# Patient Record
Sex: Female | Born: 1980 | Race: White | Hispanic: No | Marital: Married | Smoking: Former smoker
Health system: Southern US, Community
[De-identification: ages and names within clinical notes are randomized; demographics above are authoritative.]

## PROBLEM LIST (undated history)

## (undated) DIAGNOSIS — T8859XA Other complications of anesthesia, initial encounter: Secondary | ICD-10-CM

## (undated) DIAGNOSIS — Z22322 Carrier or suspected carrier of Methicillin resistant Staphylococcus aureus: Secondary | ICD-10-CM

## (undated) DIAGNOSIS — R011 Cardiac murmur, unspecified: Secondary | ICD-10-CM

## (undated) DIAGNOSIS — B019 Varicella without complication: Secondary | ICD-10-CM

## (undated) DIAGNOSIS — R87619 Unspecified abnormal cytological findings in specimens from cervix uteri: Secondary | ICD-10-CM

## (undated) DIAGNOSIS — F329 Major depressive disorder, single episode, unspecified: Secondary | ICD-10-CM

## (undated) DIAGNOSIS — N390 Urinary tract infection, site not specified: Secondary | ICD-10-CM

## (undated) DIAGNOSIS — A389 Scarlet fever, uncomplicated: Secondary | ICD-10-CM

## (undated) DIAGNOSIS — Z8679 Personal history of other diseases of the circulatory system: Secondary | ICD-10-CM

## (undated) DIAGNOSIS — K219 Gastro-esophageal reflux disease without esophagitis: Secondary | ICD-10-CM

## (undated) DIAGNOSIS — IMO0002 Reserved for concepts with insufficient information to code with codable children: Secondary | ICD-10-CM

## (undated) DIAGNOSIS — I340 Nonrheumatic mitral (valve) insufficiency: Secondary | ICD-10-CM

## (undated) DIAGNOSIS — I10 Essential (primary) hypertension: Secondary | ICD-10-CM

## (undated) DIAGNOSIS — T4145XA Adverse effect of unspecified anesthetic, initial encounter: Secondary | ICD-10-CM

## (undated) DIAGNOSIS — F32A Depression, unspecified: Secondary | ICD-10-CM

## (undated) DIAGNOSIS — E039 Hypothyroidism, unspecified: Secondary | ICD-10-CM

## (undated) DIAGNOSIS — J302 Other seasonal allergic rhinitis: Secondary | ICD-10-CM

## (undated) DIAGNOSIS — L0291 Cutaneous abscess, unspecified: Secondary | ICD-10-CM

## (undated) DIAGNOSIS — F4310 Post-traumatic stress disorder, unspecified: Secondary | ICD-10-CM

## (undated) DIAGNOSIS — G43909 Migraine, unspecified, not intractable, without status migrainosus: Secondary | ICD-10-CM

## (undated) HISTORY — DX: Cutaneous abscess, unspecified: L02.91

## (undated) HISTORY — DX: Scarlet fever, uncomplicated: A38.9

## (undated) HISTORY — PX: MYRINGOTOMY: SUR874

## (undated) HISTORY — DX: Major depressive disorder, single episode, unspecified: F32.9

## (undated) HISTORY — DX: Migraine, unspecified, not intractable, without status migrainosus: G43.909

## (undated) HISTORY — DX: Depression, unspecified: F32.A

## (undated) HISTORY — DX: Urinary tract infection, site not specified: N39.0

## (undated) HISTORY — DX: Nonrheumatic mitral (valve) insufficiency: I34.0

## (undated) HISTORY — PX: TYMPANOSTOMY TUBE PLACEMENT: SHX32

## (undated) HISTORY — DX: Post-traumatic stress disorder, unspecified: F43.10

## (undated) HISTORY — DX: Other seasonal allergic rhinitis: J30.2

## (undated) HISTORY — DX: Varicella without complication: B01.9

## (undated) HISTORY — DX: Cardiac murmur, unspecified: R01.1

## (undated) HISTORY — DX: Carrier or suspected carrier of methicillin resistant Staphylococcus aureus: Z22.322

## (undated) HISTORY — DX: Personal history of other diseases of the circulatory system: Z86.79

## (undated) HISTORY — PX: WISDOM TOOTH EXTRACTION: SHX21

## (undated) HISTORY — DX: Essential (primary) hypertension: I10

---
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
## 2002-09-04 ENCOUNTER — Other Ambulatory Visit: Admission: RE | Admit: 2002-09-04 | Discharge: 2002-09-04 | Payer: Self-pay | Admitting: Obstetrics & Gynecology

## 2004-02-04 ENCOUNTER — Other Ambulatory Visit: Admission: RE | Admit: 2004-02-04 | Discharge: 2004-02-04 | Payer: Self-pay | Admitting: Obstetrics & Gynecology

## 2004-04-26 ENCOUNTER — Emergency Department (HOSPITAL_COMMUNITY): Admission: EM | Admit: 2004-04-26 | Discharge: 2004-04-26 | Payer: Self-pay | Admitting: Emergency Medicine

## 2004-05-14 ENCOUNTER — Emergency Department (HOSPITAL_COMMUNITY): Admission: EM | Admit: 2004-05-14 | Discharge: 2004-05-14 | Payer: Self-pay | Admitting: Emergency Medicine

## 2004-06-06 ENCOUNTER — Ambulatory Visit (HOSPITAL_COMMUNITY): Admission: RE | Admit: 2004-06-06 | Discharge: 2004-06-06 | Payer: Self-pay | Admitting: Family Medicine

## 2004-11-21 ENCOUNTER — Emergency Department (HOSPITAL_COMMUNITY): Admission: EM | Admit: 2004-11-21 | Discharge: 2004-11-21 | Payer: Self-pay | Admitting: Emergency Medicine

## 2005-05-16 ENCOUNTER — Other Ambulatory Visit: Admission: RE | Admit: 2005-05-16 | Discharge: 2005-05-16 | Payer: Self-pay | Admitting: Obstetrics & Gynecology

## 2005-06-27 ENCOUNTER — Emergency Department (HOSPITAL_COMMUNITY): Admission: EM | Admit: 2005-06-27 | Discharge: 2005-06-27 | Payer: Self-pay | Admitting: Emergency Medicine

## 2005-06-30 ENCOUNTER — Emergency Department (HOSPITAL_COMMUNITY): Admission: EM | Admit: 2005-06-30 | Discharge: 2005-06-30 | Payer: Self-pay | Admitting: Emergency Medicine

## 2005-08-13 ENCOUNTER — Emergency Department (HOSPITAL_COMMUNITY): Admission: EM | Admit: 2005-08-13 | Discharge: 2005-08-13 | Payer: Self-pay | Admitting: Emergency Medicine

## 2005-11-18 ENCOUNTER — Ambulatory Visit (HOSPITAL_COMMUNITY): Admission: RE | Admit: 2005-11-18 | Discharge: 2005-11-18 | Payer: Self-pay | Admitting: Family Medicine

## 2005-11-30 ENCOUNTER — Encounter: Admission: RE | Admit: 2005-11-30 | Discharge: 2006-02-28 | Payer: Self-pay | Admitting: Family Medicine

## 2006-05-22 ENCOUNTER — Emergency Department (HOSPITAL_COMMUNITY): Admission: EM | Admit: 2006-05-22 | Discharge: 2006-05-22 | Payer: Self-pay | Admitting: Emergency Medicine

## 2006-06-15 ENCOUNTER — Ambulatory Visit (HOSPITAL_COMMUNITY): Payer: Self-pay | Admitting: Physician Assistant

## 2006-06-24 ENCOUNTER — Emergency Department (HOSPITAL_COMMUNITY): Admission: EM | Admit: 2006-06-24 | Discharge: 2006-06-24 | Payer: Self-pay | Admitting: Emergency Medicine

## 2006-07-11 ENCOUNTER — Ambulatory Visit (HOSPITAL_COMMUNITY): Payer: Self-pay | Admitting: Psychiatry

## 2006-08-06 ENCOUNTER — Emergency Department (HOSPITAL_COMMUNITY): Admission: EM | Admit: 2006-08-06 | Discharge: 2006-08-06 | Payer: Self-pay | Admitting: Emergency Medicine

## 2007-04-24 ENCOUNTER — Emergency Department (HOSPITAL_COMMUNITY): Admission: EM | Admit: 2007-04-24 | Discharge: 2007-04-25 | Payer: Self-pay | Admitting: Emergency Medicine

## 2007-06-23 ENCOUNTER — Emergency Department (HOSPITAL_COMMUNITY): Admission: EM | Admit: 2007-06-23 | Discharge: 2007-06-23 | Payer: Self-pay | Admitting: Emergency Medicine

## 2007-10-16 ENCOUNTER — Emergency Department (HOSPITAL_COMMUNITY): Admission: EM | Admit: 2007-10-16 | Discharge: 2007-10-16 | Payer: Self-pay | Admitting: Emergency Medicine

## 2008-01-24 ENCOUNTER — Emergency Department (HOSPITAL_COMMUNITY): Admission: EM | Admit: 2008-01-24 | Discharge: 2008-01-24 | Payer: Self-pay | Admitting: Family Medicine

## 2008-03-15 ENCOUNTER — Emergency Department (HOSPITAL_COMMUNITY): Admission: EM | Admit: 2008-03-15 | Discharge: 2008-03-16 | Payer: Self-pay | Admitting: Emergency Medicine

## 2008-04-19 ENCOUNTER — Emergency Department (HOSPITAL_COMMUNITY): Admission: EM | Admit: 2008-04-19 | Discharge: 2008-04-19 | Payer: Self-pay | Admitting: Emergency Medicine

## 2008-05-10 ENCOUNTER — Emergency Department (HOSPITAL_COMMUNITY): Admission: EM | Admit: 2008-05-10 | Discharge: 2008-05-11 | Payer: Self-pay | Admitting: Emergency Medicine

## 2008-10-04 ENCOUNTER — Emergency Department (HOSPITAL_BASED_OUTPATIENT_CLINIC_OR_DEPARTMENT_OTHER): Admission: EM | Admit: 2008-10-04 | Discharge: 2008-10-04 | Payer: Self-pay | Admitting: Emergency Medicine

## 2008-10-04 ENCOUNTER — Ambulatory Visit: Payer: Self-pay | Admitting: Diagnostic Radiology

## 2009-07-19 ENCOUNTER — Emergency Department (HOSPITAL_COMMUNITY): Admission: EM | Admit: 2009-07-19 | Discharge: 2009-07-19 | Payer: Self-pay | Admitting: Emergency Medicine

## 2010-07-09 ENCOUNTER — Encounter: Payer: Self-pay | Admitting: Family Medicine

## 2010-09-04 ENCOUNTER — Emergency Department (HOSPITAL_COMMUNITY)
Admission: EM | Admit: 2010-09-04 | Discharge: 2010-09-05 | Disposition: A | Discharge status: Home / Self Care | Payer: 59 | Attending: Emergency Medicine | Admitting: Emergency Medicine

## 2010-09-04 ENCOUNTER — Encounter (HOSPITAL_COMMUNITY): Payer: Self-pay

## 2010-09-04 ENCOUNTER — Emergency Department (HOSPITAL_COMMUNITY): Payer: 59

## 2010-09-04 DIAGNOSIS — R110 Nausea: Secondary | ICD-10-CM | POA: Insufficient documentation

## 2010-09-04 DIAGNOSIS — R1013 Epigastric pain: Secondary | ICD-10-CM | POA: Insufficient documentation

## 2010-09-04 DIAGNOSIS — R197 Diarrhea, unspecified: Secondary | ICD-10-CM | POA: Insufficient documentation

## 2010-09-04 DIAGNOSIS — K219 Gastro-esophageal reflux disease without esophagitis: Secondary | ICD-10-CM | POA: Insufficient documentation

## 2010-09-04 DIAGNOSIS — E039 Hypothyroidism, unspecified: Secondary | ICD-10-CM | POA: Insufficient documentation

## 2010-09-04 DIAGNOSIS — R109 Unspecified abdominal pain: Secondary | ICD-10-CM | POA: Insufficient documentation

## 2010-09-04 LAB — COMPREHENSIVE METABOLIC PANEL
ALT: 16 U/L (ref 0–35)
AST: 16 U/L (ref 0–37)
Albumin: 3.4 g/dL — ABNORMAL LOW (ref 3.5–5.2)
Alkaline Phosphatase: 49 U/L (ref 39–117)
BUN: 9 mg/dL (ref 6–23)
CO2: 23 mEq/L (ref 19–32)
Calcium: 8.8 mg/dL (ref 8.4–10.5)
Chloride: 104 mEq/L (ref 96–112)
Creatinine, Ser: 0.85 mg/dL (ref 0.4–1.2)
GFR calc Af Amer: >60 mL/min (ref >60)
Glucose, Bld: 83 mg/dL (ref 70–99)
Potassium: 3.8 mEq/L (ref 3.5–5.1)
Sodium: 135 mEq/L (ref 135–145)
Total Bilirubin: 0.5 mg/dL (ref 0.3–1.2)
Total Protein: 7.4 g/dL (ref 6.0–8.3)

## 2010-09-04 LAB — DIFFERENTIAL
Basophils Absolute: 0 10*3/uL (ref 0.0–0.1)
Basophils Relative: 0 % (ref 0–1)
Eosinophils Absolute: 0.1 10*3/uL (ref 0.0–0.7)
Eosinophils Relative: 1 % (ref 0–5)
Lymphocytes Relative: 23 % (ref 12–46)
Lymphs Abs: 2.5 10*3/uL (ref 0.7–4.0)
Monocytes Absolute: 0.8 10*3/uL (ref 0.1–1.0)
Neutro Abs: 7.4 10*3/uL (ref 1.7–7.7)
Neutrophils Relative %: 68 % (ref 43–77)

## 2010-09-04 LAB — CBC
Hemoglobin: 12.3 g/dL (ref 12.0–15.0)
MCHC: 32.5 g/dL (ref 30.0–36.0)
MCV: 91.8 fL (ref 78.0–100.0)
Platelets: 211 10*3/uL (ref 150–400)
RBC: 4.13 MIL/uL (ref 3.87–5.11)
RDW: 12.8 % (ref 11.5–15.5)
WBC: 10.9 10*3/uL — ABNORMAL HIGH (ref 4.0–10.5)

## 2010-09-04 LAB — URINALYSIS, ROUTINE W REFLEX MICROSCOPIC
Hgb urine dipstick: NEGATIVE
Nitrite: POSITIVE — AB
Protein, ur: NEGATIVE mg/dL
Specific Gravity, Urine: 1.019 (ref 1.005–1.030)
Urobilinogen, UA: 1 mg/dL (ref 0.0–1.0)
pH: 6 (ref 5.0–8.0)

## 2010-09-04 LAB — URINE MICROSCOPIC-ADD ON

## 2010-09-04 LAB — POCT PREGNANCY, URINE: Preg Test, Ur: NEGATIVE

## 2010-09-04 MED ORDER — IOHEXOL 300 MG/ML  SOLN
100.0000 mL | Freq: Once | INTRAMUSCULAR | Status: AC | PRN
  Administered 2010-09-04: 100 mL via INTRAVENOUS

## 2011-01-14 LAB — HIV ANTIBODY (ROUTINE TESTING W REFLEX): HIV: NONREACTIVE

## 2011-01-14 LAB — ANTIBODY SCREEN: Antibody Screen: NEGATIVE

## 2011-01-14 LAB — RUBELLA ANTIBODY, IGM: Rubella: IMMUNE

## 2011-03-09 LAB — POCT RAPID STREP A: Streptococcus, Group A Screen (Direct): NEGATIVE

## 2011-03-13 ENCOUNTER — Other Ambulatory Visit (HOSPITAL_COMMUNITY): Payer: Self-pay | Admitting: Obstetrics & Gynecology

## 2011-03-13 DIAGNOSIS — Z3689 Encounter for other specified antenatal screening: Secondary | ICD-10-CM

## 2011-03-21 LAB — URINALYSIS, ROUTINE W REFLEX MICROSCOPIC
Bilirubin Urine: NEGATIVE
Glucose, UA: NEGATIVE
Hgb urine dipstick: NEGATIVE
Ketones, ur: NEGATIVE
Nitrite: NEGATIVE
Protein, ur: NEGATIVE
Specific Gravity, Urine: 1.03
Urobilinogen, UA: 1
pH: 6

## 2011-03-21 LAB — PREGNANCY, URINE: Preg Test, Ur: NEGATIVE

## 2011-03-28 LAB — DIFFERENTIAL
Basophils Absolute: 0
Basophils Relative: 1
Eosinophils Absolute: 0.1
Eosinophils Relative: 1
Lymphocytes Relative: 26
Lymphs Abs: 1.4
Monocytes Absolute: 0.5
Monocytes Relative: 10
Neutro Abs: 3.2
Neutrophils Relative %: 62

## 2011-03-28 LAB — URINALYSIS, ROUTINE W REFLEX MICROSCOPIC
Ketones, ur: NEGATIVE
Nitrite: NEGATIVE
Protein, ur: NEGATIVE
Urobilinogen, UA: 1

## 2011-03-28 LAB — BASIC METABOLIC PANEL
BUN: 7
CO2: 25
Calcium: 8.5
Chloride: 109
Creatinine, Ser: 0.88
GFR calc Af Amer: >60
GFR calc non Af Amer: >60
Glucose, Bld: 93
Potassium: 3.9
Sodium: 139

## 2011-03-28 LAB — CBC
HCT: 35.6 — ABNORMAL LOW
Hemoglobin: 12.3
MCHC: 34.7
MCV: 86.3
Platelets: 215
RBC: 4.12
RDW: 14.4 — ABNORMAL HIGH
WBC: 5.2

## 2011-03-28 LAB — PREGNANCY, URINE: Preg Test, Ur: NEGATIVE

## 2011-03-28 LAB — COMPREHENSIVE METABOLIC PANEL
Albumin: 3.2 — ABNORMAL LOW
BUN: 7
Calcium: 8.3 — ABNORMAL LOW
Chloride: 108
Creatinine, Ser: 0.87
Total Bilirubin: 1.2
Total Protein: 6.8

## 2011-04-13 ENCOUNTER — Ambulatory Visit (HOSPITAL_COMMUNITY)
Admission: RE | Admit: 2011-04-13 | Discharge: 2011-04-13 | Disposition: A | Discharge status: Home / Self Care | Payer: 59 | Source: Ambulatory Visit | Attending: Obstetrics & Gynecology | Admitting: Obstetrics & Gynecology

## 2011-04-13 ENCOUNTER — Other Ambulatory Visit (HOSPITAL_COMMUNITY): Payer: Self-pay | Admitting: Obstetrics & Gynecology

## 2011-04-13 DIAGNOSIS — Z3689 Encounter for other specified antenatal screening: Secondary | ICD-10-CM

## 2011-04-13 DIAGNOSIS — E669 Obesity, unspecified: Secondary | ICD-10-CM | POA: Insufficient documentation

## 2011-04-13 DIAGNOSIS — Z363 Encounter for antenatal screening for malformations: Secondary | ICD-10-CM | POA: Insufficient documentation

## 2011-04-13 DIAGNOSIS — Z1389 Encounter for screening for other disorder: Secondary | ICD-10-CM | POA: Insufficient documentation

## 2011-04-13 DIAGNOSIS — O358XX0 Maternal care for other (suspected) fetal abnormality and damage, not applicable or unspecified: Secondary | ICD-10-CM | POA: Insufficient documentation

## 2011-05-01 ENCOUNTER — Other Ambulatory Visit (HOSPITAL_COMMUNITY): Payer: Self-pay | Admitting: Obstetrics & Gynecology

## 2011-05-01 DIAGNOSIS — Z0489 Encounter for examination and observation for other specified reasons: Secondary | ICD-10-CM

## 2011-05-10 ENCOUNTER — Encounter (HOSPITAL_COMMUNITY): Payer: Self-pay

## 2011-05-10 ENCOUNTER — Ambulatory Visit (HOSPITAL_COMMUNITY)
Admission: RE | Admit: 2011-05-10 | Discharge: 2011-05-10 | Disposition: A | Discharge status: Home / Self Care | Payer: 59 | Source: Ambulatory Visit | Attending: Obstetrics & Gynecology | Admitting: Obstetrics & Gynecology

## 2011-05-10 DIAGNOSIS — E669 Obesity, unspecified: Secondary | ICD-10-CM | POA: Insufficient documentation

## 2011-05-10 DIAGNOSIS — Z0489 Encounter for examination and observation for other specified reasons: Secondary | ICD-10-CM

## 2011-05-10 DIAGNOSIS — E079 Disorder of thyroid, unspecified: Secondary | ICD-10-CM | POA: Insufficient documentation

## 2011-05-10 DIAGNOSIS — O99210 Obesity complicating pregnancy, unspecified trimester: Secondary | ICD-10-CM | POA: Insufficient documentation

## 2011-05-10 DIAGNOSIS — E039 Hypothyroidism, unspecified: Secondary | ICD-10-CM | POA: Insufficient documentation

## 2011-05-10 NOTE — Progress Notes (Signed)
Ultrasound in AS/OBGYN/EPIC.  Follow up U/S scheduled 

## 2011-05-19 ENCOUNTER — Other Ambulatory Visit: Payer: Self-pay

## 2011-05-30 ENCOUNTER — Ambulatory Visit (HOSPITAL_COMMUNITY)
Admission: RE | Admit: 2011-05-30 | Discharge: 2011-05-30 | Disposition: A | Discharge status: Home / Self Care | Payer: 59 | Source: Ambulatory Visit | Attending: Obstetrics & Gynecology | Admitting: Obstetrics & Gynecology

## 2011-05-30 ENCOUNTER — Encounter (HOSPITAL_COMMUNITY): Payer: Self-pay

## 2011-05-30 DIAGNOSIS — O358XX0 Maternal care for other (suspected) fetal abnormality and damage, not applicable or unspecified: Secondary | ICD-10-CM | POA: Insufficient documentation

## 2011-05-30 DIAGNOSIS — O99210 Obesity complicating pregnancy, unspecified trimester: Secondary | ICD-10-CM

## 2011-05-30 DIAGNOSIS — E079 Disorder of thyroid, unspecified: Secondary | ICD-10-CM | POA: Insufficient documentation

## 2011-05-30 DIAGNOSIS — E039 Hypothyroidism, unspecified: Secondary | ICD-10-CM | POA: Insufficient documentation

## 2011-05-30 DIAGNOSIS — O99280 Endocrine, nutritional and metabolic diseases complicating pregnancy, unspecified trimester: Secondary | ICD-10-CM | POA: Insufficient documentation

## 2011-05-30 DIAGNOSIS — E669 Obesity, unspecified: Secondary | ICD-10-CM | POA: Insufficient documentation

## 2011-05-30 HISTORY — DX: Hypothyroidism, unspecified: E03.9

## 2011-06-01 ENCOUNTER — Encounter (HOSPITAL_COMMUNITY): Payer: Self-pay

## 2011-06-01 ENCOUNTER — Inpatient Hospital Stay (HOSPITAL_COMMUNITY)
Admission: AD | Admit: 2011-06-01 | Discharge: 2011-06-01 | Disposition: A | Discharge status: Home / Self Care | Payer: 59 | Source: Ambulatory Visit | Attending: Obstetrics and Gynecology | Admitting: Obstetrics and Gynecology

## 2011-06-01 DIAGNOSIS — O26899 Other specified pregnancy related conditions, unspecified trimester: Secondary | ICD-10-CM

## 2011-06-01 DIAGNOSIS — R109 Unspecified abdominal pain: Secondary | ICD-10-CM | POA: Insufficient documentation

## 2011-06-01 DIAGNOSIS — T148XXA Other injury of unspecified body region, initial encounter: Secondary | ICD-10-CM

## 2011-06-01 DIAGNOSIS — O99891 Other specified diseases and conditions complicating pregnancy: Secondary | ICD-10-CM | POA: Insufficient documentation

## 2011-06-01 HISTORY — DX: Reserved for concepts with insufficient information to code with codable children: IMO0002

## 2011-06-01 HISTORY — DX: Adverse effect of unspecified anesthetic, initial encounter: T41.45XA

## 2011-06-01 HISTORY — DX: Gastro-esophageal reflux disease without esophagitis: K21.9

## 2011-06-01 HISTORY — DX: Unspecified abnormal cytological findings in specimens from cervix uteri: R87.619

## 2011-06-01 HISTORY — DX: Other complications of anesthesia, initial encounter: T88.59XA

## 2011-06-01 LAB — URINALYSIS, ROUTINE W REFLEX MICROSCOPIC
Hgb urine dipstick: NEGATIVE
Specific Gravity, Urine: 1.02 (ref 1.005–1.030)
Urobilinogen, UA: 0.2 mg/dL (ref 0.0–1.0)

## 2011-06-01 NOTE — Progress Notes (Signed)
5 min after lifting a pt, started having lower abd cramping.

## 2011-06-01 NOTE — Progress Notes (Signed)
Pt states was lifting heavy pt onto EMS stretcher from wheelchair, then had to lift stretcher into truck. Less than 5 minutes started feeling menstrual like cramps that didn't ease up. Rates pain 6/10. Denies bleeding or vaginal d/c changes.

## 2011-06-16 LAB — GC/CHLAMYDIA PROBE AMP, GENITAL: Gonorrhea: NEGATIVE

## 2011-07-03 ENCOUNTER — Other Ambulatory Visit (HOSPITAL_COMMUNITY): Payer: Self-pay | Admitting: Obstetrics and Gynecology

## 2011-07-05 ENCOUNTER — Ambulatory Visit (HOSPITAL_COMMUNITY)
Admission: RE | Admit: 2011-07-05 | Discharge: 2011-07-05 | Disposition: A | Discharge status: Home / Self Care | Payer: 59 | Source: Ambulatory Visit | Attending: Obstetrics and Gynecology | Admitting: Obstetrics and Gynecology

## 2011-07-05 DIAGNOSIS — O3660X0 Maternal care for excessive fetal growth, unspecified trimester, not applicable or unspecified: Secondary | ICD-10-CM | POA: Insufficient documentation

## 2011-07-05 DIAGNOSIS — E669 Obesity, unspecified: Secondary | ICD-10-CM | POA: Insufficient documentation

## 2011-07-05 DIAGNOSIS — E039 Hypothyroidism, unspecified: Secondary | ICD-10-CM | POA: Insufficient documentation

## 2011-07-12 ENCOUNTER — Ambulatory Visit (HOSPITAL_COMMUNITY): Payer: 59

## 2011-08-10 LAB — STREP B DNA PROBE: GBS: POSITIVE

## 2011-09-14 ENCOUNTER — Encounter (HOSPITAL_COMMUNITY)
Admission: AD | Disposition: A | Discharge status: Home / Self Care | Payer: Self-pay | Source: Ambulatory Visit | Attending: Obstetrics and Gynecology

## 2011-09-14 ENCOUNTER — Encounter (HOSPITAL_COMMUNITY): Payer: Self-pay | Admitting: Anesthesiology

## 2011-09-14 ENCOUNTER — Inpatient Hospital Stay (HOSPITAL_COMMUNITY): Payer: 59 | Admitting: Anesthesiology

## 2011-09-14 ENCOUNTER — Inpatient Hospital Stay (HOSPITAL_COMMUNITY)
Admission: AD | Admit: 2011-09-14 | Discharge: 2011-09-17 | DRG: 766 | Disposition: A | Discharge status: Home / Self Care | Payer: 59 | Source: Ambulatory Visit | Attending: Obstetrics and Gynecology | Admitting: Obstetrics and Gynecology

## 2011-09-14 ENCOUNTER — Encounter (HOSPITAL_COMMUNITY): Payer: Self-pay | Admitting: *Deleted

## 2011-09-14 DIAGNOSIS — E079 Disorder of thyroid, unspecified: Secondary | ICD-10-CM | POA: Diagnosis present

## 2011-09-14 DIAGNOSIS — O480 Post-term pregnancy: Principal | ICD-10-CM | POA: Diagnosis present

## 2011-09-14 DIAGNOSIS — E039 Hypothyroidism, unspecified: Secondary | ICD-10-CM | POA: Diagnosis present

## 2011-09-14 DIAGNOSIS — E669 Obesity, unspecified: Secondary | ICD-10-CM | POA: Diagnosis present

## 2011-09-14 LAB — CBC
HCT: 33.4 % — ABNORMAL LOW (ref 36.0–46.0)
Hemoglobin: 11.1 g/dL — ABNORMAL LOW (ref 12.0–15.0)
MCH: 30.3 pg (ref 26.0–34.0)
RBC: 3.66 MIL/uL — ABNORMAL LOW (ref 3.87–5.11)

## 2011-09-14 LAB — RPR: RPR Ser Ql: NONREACTIVE

## 2011-09-14 SURGERY — Surgical Case
Anesthesia: Regional | Site: Abdomen | Wound class: Clean Contaminated

## 2011-09-14 MED ORDER — EPHEDRINE 5 MG/ML INJ
INTRAVENOUS | Status: AC
  Filled 2011-09-14: qty 10

## 2011-09-14 MED ORDER — MORPHINE SULFATE (PF) 0.5 MG/ML IJ SOLN
INTRAMUSCULAR | Status: DC | PRN
  Administered 2011-09-14: 1 mg via INTRAVENOUS
  Administered 2011-09-14: 4 mg via EPIDURAL

## 2011-09-14 MED ORDER — LIDOCAINE HCL (PF) 1 % IJ SOLN: 30.0000 mL | INTRAMUSCULAR | Status: DC | PRN

## 2011-09-14 MED ORDER — OXYTOCIN BOLUS FROM INFUSION
500.0000 mL | Freq: Once | INTRAVENOUS | Status: DC
  Filled 2011-09-14: qty 500

## 2011-09-14 MED ORDER — SODIUM BICARBONATE 8.4 % IV SOLN
INTRAVENOUS | Status: DC | PRN
  Administered 2011-09-14: 4 mL via EPIDURAL

## 2011-09-14 MED ORDER — BUTORPHANOL TARTRATE 2 MG/ML IJ SOLN
1.0000 mg | INTRAMUSCULAR | Status: DC | PRN
Start: 2011-09-14 — End: 2011-09-14
  Administered 2011-09-14 (×2): 1 mg via INTRAVENOUS
  Filled 2011-09-14 (×2): qty 1

## 2011-09-14 MED ORDER — EPHEDRINE SULFATE 50 MG/ML IJ SOLN
INTRAMUSCULAR | Status: DC | PRN
  Administered 2011-09-14: 10 mg via INTRAVENOUS

## 2011-09-14 MED ORDER — EPHEDRINE 5 MG/ML INJ
10.0000 mg | INTRAVENOUS | Status: DC | PRN
  Filled 2011-09-14: qty 4

## 2011-09-14 MED ORDER — PHENYLEPHRINE 40 MCG/ML (10ML) SYRINGE FOR IV PUSH (FOR BLOOD PRESSURE SUPPORT): 80.0000 ug | PREFILLED_SYRINGE | INTRAVENOUS | Status: DC | PRN

## 2011-09-14 MED ORDER — LACTATED RINGERS IV SOLN
INTRAVENOUS | Status: DC | PRN
  Administered 2011-09-14 (×2): via INTRAVENOUS

## 2011-09-14 MED ORDER — ACETAMINOPHEN 325 MG PO TABS: 650.0000 mg | ORAL_TABLET | ORAL | Status: DC | PRN

## 2011-09-14 MED ORDER — IBUPROFEN 600 MG PO TABS: 600.0000 mg | ORAL_TABLET | Freq: Four times a day (QID) | ORAL | Status: DC | PRN

## 2011-09-14 MED ORDER — FENTANYL 2.5 MCG/ML BUPIVACAINE 1/10 % EPIDURAL INFUSION (WH - ANES)
14.0000 mL/h | INTRAMUSCULAR | Status: DC
  Administered 2011-09-14 (×2): 14 mL/h via EPIDURAL
  Filled 2011-09-14 (×4): qty 60

## 2011-09-14 MED ORDER — LACTATED RINGERS IV SOLN: 500.0000 mL | Freq: Once | INTRAVENOUS | Status: DC

## 2011-09-14 MED ORDER — PHENYLEPHRINE 40 MCG/ML (10ML) SYRINGE FOR IV PUSH (FOR BLOOD PRESSURE SUPPORT)
PREFILLED_SYRINGE | INTRAVENOUS | Status: AC
  Filled 2011-09-14: qty 5

## 2011-09-14 MED ORDER — ONDANSETRON HCL 4 MG/2ML IJ SOLN: 4.0000 mg | Freq: Four times a day (QID) | INTRAMUSCULAR | Status: DC | PRN

## 2011-09-14 MED ORDER — OXYTOCIN 20 UNITS IN LACTATED RINGERS INFUSION - SIMPLE: 125.0000 mL/h | Freq: Once | INTRAVENOUS | Status: DC

## 2011-09-14 MED ORDER — MISOPROSTOL 25 MCG QUARTER TABLET
25.0000 ug | ORAL_TABLET | ORAL | Status: DC | PRN
  Administered 2011-09-14: 25 ug via VAGINAL
  Filled 2011-09-14: qty 0.25

## 2011-09-14 MED ORDER — ONDANSETRON HCL 4 MG/2ML IJ SOLN
INTRAMUSCULAR | Status: AC
  Filled 2011-09-14: qty 2

## 2011-09-14 MED ORDER — CITRIC ACID-SODIUM CITRATE 334-500 MG/5ML PO SOLN
30.0000 mL | ORAL | Status: DC | PRN
  Administered 2011-09-14: 30 mL via ORAL
  Filled 2011-09-14: qty 15

## 2011-09-14 MED ORDER — LACTATED RINGERS IV SOLN: 500.0000 mL | INTRAVENOUS | Status: DC | PRN

## 2011-09-14 MED ORDER — ONDANSETRON HCL 4 MG/2ML IJ SOLN
INTRAMUSCULAR | Status: DC | PRN
  Administered 2011-09-14: 4 mg via INTRAVENOUS

## 2011-09-14 MED ORDER — EPHEDRINE 5 MG/ML INJ: 10.0000 mg | INTRAVENOUS | Status: DC | PRN

## 2011-09-14 MED ORDER — MORPHINE SULFATE 0.5 MG/ML IJ SOLN
INTRAMUSCULAR | Status: AC
  Filled 2011-09-14: qty 10

## 2011-09-14 MED ORDER — OXYTOCIN 20 UNITS IN LACTATED RINGERS INFUSION - SIMPLE
1.0000 m[IU]/min | INTRAVENOUS | Status: DC
  Administered 2011-09-14: 2 m[IU]/min via INTRAVENOUS
  Filled 2011-09-14: qty 1000

## 2011-09-14 MED ORDER — OXYTOCIN 10 UNIT/ML IJ SOLN
INTRAMUSCULAR | Status: AC
  Filled 2011-09-14: qty 2

## 2011-09-14 MED ORDER — PHENYLEPHRINE HCL 10 MG/ML IJ SOLN
INTRAMUSCULAR | Status: DC | PRN
  Administered 2011-09-14 (×3): 80 ug via INTRAVENOUS
  Administered 2011-09-14: 40 ug via INTRAVENOUS
  Administered 2011-09-14: 80 ug via INTRAVENOUS
  Administered 2011-09-14: 120 ug via INTRAVENOUS

## 2011-09-14 MED ORDER — TERBUTALINE SULFATE 1 MG/ML IJ SOLN: 0.2500 mg | Freq: Once | INTRAMUSCULAR | Status: DC | PRN

## 2011-09-14 MED ORDER — OXYTOCIN 10 UNIT/ML IJ SOLN
INTRAMUSCULAR | Status: DC | PRN
  Administered 2011-09-14: 20 [IU] via INTRAMUSCULAR

## 2011-09-14 MED ORDER — OXYCODONE-ACETAMINOPHEN 5-325 MG PO TABS: 1.0000 | ORAL_TABLET | ORAL | Status: DC | PRN

## 2011-09-14 MED ORDER — MEPERIDINE HCL 25 MG/ML IJ SOLN
INTRAMUSCULAR | Status: AC
  Filled 2011-09-14: qty 1

## 2011-09-14 MED ORDER — FENTANYL 2.5 MCG/ML BUPIVACAINE 1/10 % EPIDURAL INFUSION (WH - ANES)
INTRAMUSCULAR | Status: DC | PRN
  Administered 2011-09-14: 14 mL/h via EPIDURAL

## 2011-09-14 MED ORDER — DIPHENHYDRAMINE HCL 50 MG/ML IJ SOLN: 12.5000 mg | INTRAMUSCULAR | Status: DC | PRN

## 2011-09-14 MED ORDER — MEPERIDINE HCL 25 MG/ML IJ SOLN
INTRAMUSCULAR | Status: DC | PRN
  Administered 2011-09-14: 12.5 mg via INTRAVENOUS

## 2011-09-14 MED ORDER — PHENYLEPHRINE 40 MCG/ML (10ML) SYRINGE FOR IV PUSH (FOR BLOOD PRESSURE SUPPORT)
PREFILLED_SYRINGE | INTRAVENOUS | Status: AC
  Filled 2011-09-14: qty 10

## 2011-09-14 MED ORDER — PENICILLIN G POTASSIUM 5000000 UNITS IJ SOLR
2.5000 10*6.[IU] | INTRAMUSCULAR | Status: DC
  Administered 2011-09-14 (×4): 2.5 10*6.[IU] via INTRAVENOUS
  Filled 2011-09-14 (×9): qty 2.5

## 2011-09-14 MED ORDER — PENICILLIN G POTASSIUM 5000000 UNITS IJ SOLR
5.0000 10*6.[IU] | Freq: Once | INTRAVENOUS | Status: AC
  Administered 2011-09-14: 5 10*6.[IU] via INTRAVENOUS
  Filled 2011-09-14: qty 5

## 2011-09-14 MED ORDER — FLEET ENEMA 7-19 GM/118ML RE ENEM: 1.0000 | ENEMA | RECTAL | Status: DC | PRN

## 2011-09-14 MED ORDER — PHENYLEPHRINE 40 MCG/ML (10ML) SYRINGE FOR IV PUSH (FOR BLOOD PRESSURE SUPPORT)
80.0000 ug | PREFILLED_SYRINGE | INTRAVENOUS | Status: DC | PRN
  Filled 2011-09-14: qty 5

## 2011-09-14 MED ORDER — LACTATED RINGERS IV SOLN
INTRAVENOUS | Status: DC
  Administered 2011-09-14: 09:00:00 via INTRAVENOUS
  Administered 2011-09-14: 300 mL via INTRAVENOUS
  Administered 2011-09-14 (×5): via INTRAVENOUS

## 2011-09-14 SURGICAL SUPPLY — 30 items
ADH SKN CLS APL DERMABOND .7 (GAUZE/BANDAGES/DRESSINGS) ×1
CLOTH BEACON ORANGE TIMEOUT ST (SAFETY) ×2 IMPLANT
DERMABOND ADVANCED (GAUZE/BANDAGES/DRESSINGS) ×1
DERMABOND ADVANCED .7 DNX12 (GAUZE/BANDAGES/DRESSINGS) IMPLANT
DRESSING TELFA 8X3 (GAUZE/BANDAGES/DRESSINGS) ×2 IMPLANT
ELECT REM PT RETURN 9FT ADLT (ELECTROSURGICAL) ×2
ELECTRODE REM PT RTRN 9FT ADLT (ELECTROSURGICAL) ×1 IMPLANT
EXTRACTOR VACUUM M CUP 4 TUBE (SUCTIONS) IMPLANT
GAUZE SPONGE 4X4 12PLY STRL LF (GAUZE/BANDAGES/DRESSINGS) ×4 IMPLANT
GLOVE BIO SURGEON STRL SZ7 (GLOVE) ×4 IMPLANT
GOWN PREVENTION PLUS LG XLONG (DISPOSABLE) ×4 IMPLANT
KIT ABG SYR 3ML LUER SLIP (SYRINGE) IMPLANT
NDL HYPO 25X5/8 SAFETYGLIDE (NEEDLE) IMPLANT
NEEDLE HYPO 25X5/8 SAFETYGLIDE (NEEDLE) IMPLANT
NS IRRIG 1000ML POUR BTL (IV SOLUTION) ×2 IMPLANT
PACK C SECTION WH (CUSTOM PROCEDURE TRAY) ×2 IMPLANT
PAD ABD 7.5X8 STRL (GAUZE/BANDAGES/DRESSINGS) ×2 IMPLANT
RTRCTR C-SECT PINK 25CM LRG (MISCELLANEOUS) ×1 IMPLANT
RTRCTR C-SECT PINK 34CM XLRG (MISCELLANEOUS) IMPLANT
SLEEVE SCD COMPRESS KNEE MED (MISCELLANEOUS) IMPLANT
STAPLER VISISTAT 35W (STAPLE) IMPLANT
SUT CHROMIC 1 CTX 36 (SUTURE) ×5 IMPLANT
SUT CHROMIC 2 0 CT 1 (SUTURE) ×1 IMPLANT
SUT PDS AB 0 CTX 60 (SUTURE) ×2 IMPLANT
SUT VIC AB 2-0 CT1 27 (SUTURE) ×2
SUT VIC AB 2-0 CT1 TAPERPNT 27 (SUTURE) ×1 IMPLANT
SUT VIC AB 4-0 KS 27 (SUTURE) ×1 IMPLANT
TOWEL OR 17X24 6PK STRL BLUE (TOWEL DISPOSABLE) ×4 IMPLANT
TRAY FOLEY CATH 14FR (SET/KITS/TRAYS/PACK) ×2 IMPLANT
WATER STERILE IRR 1000ML POUR (IV SOLUTION) ×2 IMPLANT

## 2011-09-14 NOTE — Progress Notes (Signed)
Patient ID: [name], female   DOB: 1980/06/26, 31 y.o.   MRN: [id number]  S: Pt received 1-2 doses of cytotech overnight.  Was started on pitocin this morning.  Became uncomfortable with contractions and had epidural placed. Now comfortable O:  Filed Vitals:   09/14/11 1116 09/14/11 1118 09/14/11 1131 09/14/11 1134  BP:    111/52  Pulse: 79 79  63  Temp:    97.6 F (36.4 C)  TempSrc:    Oral  Resp:   20 20  Height:      Weight:      SpO2: 100% 100%     Aox3, NAD Obese gravid soft cvx 1+/90/-2 scar tissue palpated from prior cvx cryo FHT 140 + accels variables with ctx Toco: poorly tracing  Transcervical foley placed without difficulty  A/P 1) Cont pit 2) Overall FWB reassuring

## 2011-09-14 NOTE — Transfer of Care (Signed)
Immediate Anesthesia Transfer of Care Note  Patient: Suzanne Villanueva  Procedure(s) Performed: Procedure(s) (LRB): CESAREAN SECTION (N/A)  Patient Location: PACU  Anesthesia Type: Epidural  Level of Consciousness: awake, alert  and oriented  Airway & Oxygen Therapy: Patient Spontanous Breathing  Post-op Assessment: Report given to PACU RN and Post -op Vital signs reviewed and stable  Post vital signs: Reviewed and stable  Complications: No apparent anesthesia complications

## 2011-09-14 NOTE — H&P (Signed)
Pt is a 31 year old white female, G1P0 at 60 weeks who presents for induction secondary to postdates. PNC was complicated by morbid obesity and hypothyroidism. Pt also had mild hypertension for the last week. Pt had normal PIH labs. EFW on ultrasound at 37 weeks was 57%. PMHx: See Catha Gosselin PE: VSSAF  HEENT- wnl ABD- wnl Pelvic- 50/ft/-2 vtx FHTs- reactive  IMP/ IUP at 41 weeks         Postterm         Morbid obesity         Hypothyroidism Plan/ Admit          Start Induction

## 2011-09-14 NOTE — Anesthesia Preprocedure Evaluation (Signed)
Anesthesia Evaluation  Patient identified by MRN, date of birth, ID band Patient awake    Reviewed: Allergy & Precautions, H&P , Patient's Chart, lab work & pertinent test results  Airway Mallampati: III TM Distance: >3 FB Neck ROM: full    Dental  (+) Teeth Intact   Pulmonary  breath sounds clear to auscultation        Cardiovascular Rhythm:regular Rate:Normal     Neuro/Psych    GI/Hepatic GERD-  ,  Endo/Other  Hypothyroidism Morbid obesity  Renal/GU      Musculoskeletal   Abdominal   Peds  Hematology   Anesthesia Other Findings       Reproductive/Obstetrics (+) Pregnancy                           Anesthesia Physical Anesthesia Plan  ASA: III  Anesthesia Plan: Epidural   Post-op Pain Management:    Induction:   Airway Management Planned:   Additional Equipment:   Intra-op Plan:   Post-operative Plan:   Informed Consent: I have reviewed the patients History and Physical, chart, labs and discussed the procedure including the risks, benefits and alternatives for the proposed anesthesia with the patient or authorized representative who has indicated his/her understanding and acceptance.   Dental Advisory Given  Plan Discussed with:   Anesthesia Plan Comments: (Labs checked- platelets confirmed with RN in room. Fetal heart tracing, per RN, reported to be stable enough for sitting procedure. Discussed epidural, and patient consents to the procedure:  included risk of possible headache,backache, failed block, allergic reaction, and nerve injury. This patient was asked if she had any questions or concerns before the procedure started. )        Anesthesia Quick Evaluation

## 2011-09-14 NOTE — Progress Notes (Signed)
I saw pt in MAU triage room after being informed by [facility] charge nurse that Dr. Dareen Piano had sent pat for direct admission for induction. Pt had arrived at 2100 per Dr. Ewell Poe instructions of [facility] 3/25. Induction had not been scheduled other than pt instruction. Pt agitated. BP 133/78. FHTs 135 with audible fetal movement. Pt advised that she can wait in lobby or lie down in MAU room, and I apologized for the delay. To room 161 at 0125.

## 2011-09-14 NOTE — Op Note (Signed)
Pre-Operative Diagnosis: 1) 41 week intrauterine pregnant 2) failure to progress 3) maternal morbid obesity Postoperative Diagnosis: Same Procedure: Primary low transverse cesarean section via Pfannenstiel skin incision Surgeon: Dr. Waynard Reeds Assistant:None Operative Findings:Vigorous female infant in the vertex presentation. Infants weight was not available at the time of this dictation. Apgars of 9 and 9. Normal appearing ovaries, tubes, uterus. Specimen: Placenta for disposal EBL: Total I/O In: 1000 [I.V.:1000] Out: 925 [Urine:925]   Procedure:[name] is an 31 year old gravida 1 para 0 at 41 weeks and 0 days estimated gestational age who presents for cesarean section. The patient was admitted on the evening prior to surgery for a postdates induction of labor. She was closed at that time. She underwent Cytotec induction. In the morning she had a transcervical Foley placed when she was 1 cm 80% and -2 station. Her Foley catheter came out and the patient was 5 cm dilated. Anatomy was performed and an intrauterine pressure catheter was placed. The IUPC demonstrated adequate labor for several hours. However the patient was Experiencing late decelerations in the fetal tracing. She had had these on and off throughout the day However given the fact that labor was demonstrated to be adequate by intrauterine pressure catheter and we were remote from delivery decision was made to proceed with primary cesarean section for failure to progress and nonreassuring fetal well-being. Following the appropriate informed consent the patient was brought to the operating room where spinal anesthesia was administered and found to be adequate. The patient was appropriately identified during a time out procedure. She was placed in the dorsal supine position with a leftward tilt. She was prepped and draped in the normal sterile fashion. Scalpel was then used to make a Pfannenstiel skin incision which was carried down to the  underlying layers of soft tissue to the fascia. The fascia was incised in the midline and the fascial incision was extended laterally with Mayo scissors. The superior aspect of the fascial incision was grasped with Coker clamps x2, tented up and the rectus muscles dissected off sharply with the electrocautery unit area and the same procedure was repeated on the inferior aspect of the fascial incision. The rectus muscles were separated in the midline. The abdominal peritoneum was identified, tented up, entered sharply, and the incision was extended superiorly and inferiorly with good visualization of the bladder. The Alexis retractor was then deployed. The vesicouterine peritoneum was identified, tented up, entered sharply, and the bladder flap was created digitally. Scalpel was then used to make a low transverse incision on the uterus which was extended laterally with both blunt dissection. The fetal vertex was identified, delivered easily through the uterine incision followed by the body. The infant was bulb suctioned on the operative field cried vigorously, cord was clamped and cut and the infant was passed to the waiting neonatologist. Placenta was then delivered spontaneously, the uterus was cleared of all clot and debris. The uterine incision was repaired with #1 chromic in running locked fashion followed by a second imbricating layer. Ovaries and tubes were inspected and normal. The Alexis retractor was removed. The abdominal peritoneum was reapproximated with 2-0 Vicryl in a running fashion, the rectus muscles was reapproximated with #1 chromic in a running fashion. The fascia was closed with a looped PDS in a running fashion. The subcutaneous layer was reapproximated with 2-0 plain gut with interrupted suture. The skin was closed with 4-0 vicryl in a subcuticular fashion and dermabond. All sponge lap and needle counts were correct x2. Patient  tolerated the procedure well and recovered in stable condition  following the procedure.

## 2011-09-14 NOTE — Anesthesia Procedure Notes (Signed)
Epidural Patient location during procedure: OB  Preanesthetic Checklist Completed: patient identified, site marked, surgical consent, pre-op evaluation, timeout performed, IV checked, risks and benefits discussed and monitors and equipment checked  Epidural Patient position: sitting Prep: site prepped and draped and DuraPrep Patient monitoring: continuous pulse ox and blood pressure Approach: midline Injection technique: LOR air  Needle:  Needle type: Tuohy  Needle gauge: 17 G Needle length: 9 cm Needle insertion depth: 9 cm Catheter type: closed end flexible Catheter size: 19 Gauge Test dose: negative  Assessment Events: blood not aspirated, injection not painful, no injection resistance, negative IV test and no paresthesia  Additional Notes Dosing of Epidural:  1st dose, through needle ............................................. epi 1:200K + Xylocaine 40 mg  2nd dose, through catheter, after waiting 3 minutes.....epi 1:200K + Xylocaine 40 mg  3rd dose, through catheter after waiting 3 minutes .............................Marcaine   4mg   ( mg Marcaine are expressed as equivilent  cc's medication removed from the 0.1%Bupiv / fentanyl syringe from L&D pump)  ( 2% Xylo charted as a single dose in Epic Meds for ease of charting; actual dosing was fractionated as above, for saftey's sake)  As each dose occurred, patient was free of IV sx; and patient exhibited no evidence of SA injection.  Patient is more comfortable after epidural dosed. Please see RN's note for documentation of vital signs,and FHR which are stable.    

## 2011-09-14 NOTE — Progress Notes (Signed)
Patient ID: Suzanne Villanueva, female   DOB: [birth year], 31 y.o.   MRN: [id number]  S: Pt comfortable with epidural O: Filed Vitals:   09/14/11 1903 09/14/11 1934 09/14/11 2004 09/14/11 2103  BP: 131/77 124/70 105/49 124/71  Pulse: 74 78 73 82  Temp:   97.8 F (36.6 C) 97.9 F (36.6 C)  TempSrc:   Axillary Axillary  Resp: 20  18   Height:      Weight:      SpO2:       Cvx 5/90/-2 FHT 140 with occasional late decelerations, accelerations also present toco Q2, MVUs 210  A/P Pt has had adequate labor since IUPC placed. No Cervical change since foley catheter came out early this afternoon.  Pt remote from delivery.  Findings discussed with patient and husband. Concern for late decelerations discussed.  Given NRFWB remote from delivery and the likelihood of a challenging cesarean section due to maternal habitus the decision was made to proceed with cesarean section.  R/B/A reviewed with patient and consent obtained.

## 2011-09-14 NOTE — Brief Op Note (Signed)
09/14/2011  11:47 PM  PATIENT:  Suzanne Villanueva  31 y.o. female  PRE-OPERATIVE DIAGNOSIS:  failure to progress  POST-OPERATIVE DIAGNOSIS:  failure to progress  PROCEDURE:  Procedure(s) (LRB): CESAREAN SECTION (N/A)  SURGEON:  Surgeon(s) and Role:    * Orli Degrave H. Tenny Craw, MD - Primary  PHYSICIAN ASSISTANT:   ASSISTANTS: none   ANESTHESIA:   epidural  EBL:  Total I/O In: 1000 [I.V.:1000] Out: 925 [Urine:925]  BLOOD ADMINISTERED:none  DRAINS: Urinary Catheter (Foley)   LOCAL MEDICATIONS USED:  NONE  SPECIMEN:  Source of Specimen:  Placenta for disposal  DISPOSITION OF SPECIMEN:  L&D  COUNTS:  YES  TOURNIQUET:  * No tourniquets in log *  DICTATION: .Dragon Dictation  PLAN OF CARE: Admit to inpatient   PATIENT DISPOSITION:  PACU - hemodynamically stable.   Delay start of Pharmacological VTE agent (>24hrs) due to surgical blood loss or risk of bleeding: not applicable

## 2011-09-14 NOTE — Progress Notes (Signed)
Patient ID: Suzanne Villanueva, female   DOB: 1980-08-16, 31 y.o.   MRN: [id number] S:  S: Pt comfortable O: AFVSS Perineum wet, Cervix 5/90/-2, meconium stained fluid from vagina toco irregularly tracing  IUPC and FSE placed.  Pt has had variable decels on/off throughout day.  Will monitor closely  A/P 1) Overall FWB reassuring 2) Cont pitocin

## 2011-09-15 ENCOUNTER — Encounter (HOSPITAL_COMMUNITY): Payer: Self-pay | Admitting: *Deleted

## 2011-09-15 LAB — CBC
Platelets: 172 10*3/uL (ref 150–400)
RDW: 14.1 % (ref 11.5–15.5)
WBC: 15 10*3/uL — ABNORMAL HIGH (ref 4.0–10.5)

## 2011-09-15 MED ORDER — SCOPOLAMINE 1 MG/3DAYS TD PT72
1.0000 | MEDICATED_PATCH | Freq: Once | TRANSDERMAL | Status: DC
  Administered 2011-09-15: 1.5 mg via TRANSDERMAL

## 2011-09-15 MED ORDER — MEPERIDINE HCL 25 MG/ML IJ SOLN: 6.2500 mg | INTRAMUSCULAR | Status: DC | PRN

## 2011-09-15 MED ORDER — OXYCODONE-ACETAMINOPHEN 5-325 MG PO TABS
1.0000 | ORAL_TABLET | ORAL | Status: DC | PRN
  Administered 2011-09-15 – 2011-09-16 (×4): 1 via ORAL
  Administered 2011-09-16 (×2): 2 via ORAL
  Administered 2011-09-16 – 2011-09-17 (×3): 1 via ORAL
  Administered 2011-09-17: 2 via ORAL
  Filled 2011-09-15 (×6): qty 1
  Filled 2011-09-15: qty 2
  Filled 2011-09-15: qty 1
  Filled 2011-09-15: qty 4

## 2011-09-15 MED ORDER — KETOROLAC TROMETHAMINE 30 MG/ML IJ SOLN
INTRAMUSCULAR | Status: AC
  Filled 2011-09-15: qty 1

## 2011-09-15 MED ORDER — SODIUM CHLORIDE 0.9 % IV SOLN
1.0000 ug/kg/h | INTRAVENOUS | Status: DC | PRN
  Filled 2011-09-15: qty 2.5

## 2011-09-15 MED ORDER — DIPHENHYDRAMINE HCL 25 MG PO CAPS: 25.0000 mg | ORAL_CAPSULE | ORAL | Status: DC | PRN

## 2011-09-15 MED ORDER — ONDANSETRON HCL 4 MG/2ML IJ SOLN: 4.0000 mg | INTRAMUSCULAR | Status: DC | PRN

## 2011-09-15 MED ORDER — DIPHENHYDRAMINE HCL 25 MG PO CAPS: 25.0000 mg | ORAL_CAPSULE | Freq: Four times a day (QID) | ORAL | Status: DC | PRN

## 2011-09-15 MED ORDER — LIDOCAINE-EPINEPHRINE (PF) 2 %-1:200000 IJ SOLN
INTRAMUSCULAR | Status: AC
  Filled 2011-09-15: qty 20

## 2011-09-15 MED ORDER — SIMETHICONE 80 MG PO CHEW: 80.0000 mg | CHEWABLE_TABLET | ORAL | Status: DC | PRN

## 2011-09-15 MED ORDER — WITCH HAZEL-GLYCERIN EX PADS: 1.0000 "application " | MEDICATED_PAD | CUTANEOUS | Status: DC | PRN

## 2011-09-15 MED ORDER — KETOROLAC TROMETHAMINE 30 MG/ML IJ SOLN: 30.0000 mg | Freq: Four times a day (QID) | INTRAMUSCULAR | Status: AC | PRN

## 2011-09-15 MED ORDER — DIBUCAINE 1 % RE OINT: 1.0000 "application " | TOPICAL_OINTMENT | RECTAL | Status: DC | PRN

## 2011-09-15 MED ORDER — NALBUPHINE HCL 10 MG/ML IJ SOLN
5.0000 mg | INTRAMUSCULAR | Status: DC | PRN
  Filled 2011-09-15: qty 1

## 2011-09-15 MED ORDER — MENTHOL 3 MG MT LOZG: 1.0000 | LOZENGE | OROMUCOSAL | Status: DC | PRN

## 2011-09-15 MED ORDER — SIMETHICONE 80 MG PO CHEW
80.0000 mg | CHEWABLE_TABLET | Freq: Three times a day (TID) | ORAL | Status: DC
  Administered 2011-09-15 – 2011-09-17 (×9): 80 mg via ORAL

## 2011-09-15 MED ORDER — SCOPOLAMINE 1 MG/3DAYS TD PT72
MEDICATED_PATCH | TRANSDERMAL | Status: AC
  Filled 2011-09-15: qty 1

## 2011-09-15 MED ORDER — IBUPROFEN 600 MG PO TABS
600.0000 mg | ORAL_TABLET | Freq: Four times a day (QID) | ORAL | Status: DC | PRN
  Filled 2011-09-15 (×4): qty 1
  Filled 2011-09-15 (×2): qty 2

## 2011-09-15 MED ORDER — SODIUM CHLORIDE 0.9 % IJ SOLN: 3.0000 mL | INTRAMUSCULAR | Status: DC | PRN

## 2011-09-15 MED ORDER — LANOLIN HYDROUS EX OINT: 1.0000 "application " | TOPICAL_OINTMENT | CUTANEOUS | Status: DC | PRN

## 2011-09-15 MED ORDER — DIPHENHYDRAMINE HCL 50 MG/ML IJ SOLN
12.5000 mg | INTRAMUSCULAR | Status: DC | PRN
  Administered 2011-09-15: 12.5 mg via INTRAVENOUS
  Filled 2011-09-15: qty 1

## 2011-09-15 MED ORDER — LEVOTHYROXINE SODIUM 100 MCG PO TABS
100.0000 ug | ORAL_TABLET | Freq: Every day | ORAL | Status: DC
  Administered 2011-09-15 – 2011-09-17 (×3): 100 ug via ORAL
  Filled 2011-09-15 (×4): qty 1

## 2011-09-15 MED ORDER — ONDANSETRON HCL 4 MG/2ML IJ SOLN: 4.0000 mg | Freq: Three times a day (TID) | INTRAMUSCULAR | Status: DC | PRN

## 2011-09-15 MED ORDER — KETOROLAC TROMETHAMINE 30 MG/ML IJ SOLN
30.0000 mg | Freq: Four times a day (QID) | INTRAMUSCULAR | Status: AC | PRN
  Administered 2011-09-15: 30 mg via INTRAVENOUS

## 2011-09-15 MED ORDER — IBUPROFEN 600 MG PO TABS
600.0000 mg | ORAL_TABLET | Freq: Four times a day (QID) | ORAL | Status: DC
  Administered 2011-09-15 – 2011-09-17 (×10): 600 mg via ORAL
  Filled 2011-09-15: qty 1

## 2011-09-15 MED ORDER — OXYTOCIN 20 UNITS IN LACTATED RINGERS INFUSION - SIMPLE
125.0000 mL/h | INTRAVENOUS | Status: AC
  Administered 2011-09-15: 125 mL/h via INTRAVENOUS

## 2011-09-15 MED ORDER — OXYTOCIN 20 UNITS IN LACTATED RINGERS INFUSION - SIMPLE
INTRAVENOUS | Status: AC
  Filled 2011-09-15: qty 1000

## 2011-09-15 MED ORDER — PRENATAL MULTIVITAMIN CH
1.0000 | ORAL_TABLET | Freq: Every day | ORAL | Status: DC
  Administered 2011-09-15 – 2011-09-17 (×3): 1 via ORAL
  Filled 2011-09-15 (×3): qty 1

## 2011-09-15 MED ORDER — ACETAMINOPHEN 325 MG PO TABS: 325.0000 mg | ORAL_TABLET | ORAL | Status: DC | PRN

## 2011-09-15 MED ORDER — NALOXONE HCL 0.4 MG/ML IJ SOLN: 0.4000 mg | INTRAMUSCULAR | Status: DC | PRN

## 2011-09-15 MED ORDER — FENTANYL CITRATE 0.05 MG/ML IJ SOLN
25.0000 ug | INTRAMUSCULAR | Status: DC | PRN
  Administered 2011-09-15: 50 ug via INTRAVENOUS

## 2011-09-15 MED ORDER — PROMETHAZINE HCL 25 MG/ML IJ SOLN: 6.2500 mg | INTRAMUSCULAR | Status: DC | PRN

## 2011-09-15 MED ORDER — TETANUS-DIPHTH-ACELL PERTUSSIS 5-2.5-18.5 LF-MCG/0.5 IM SUSP: 0.5000 mL | Freq: Once | INTRAMUSCULAR | Status: DC

## 2011-09-15 MED ORDER — DIPHENHYDRAMINE HCL 50 MG/ML IJ SOLN: 25.0000 mg | INTRAMUSCULAR | Status: DC | PRN

## 2011-09-15 MED ORDER — FENTANYL CITRATE 0.05 MG/ML IJ SOLN
INTRAMUSCULAR | Status: AC
  Filled 2011-09-15: qty 2

## 2011-09-15 MED ORDER — SODIUM BICARBONATE 8.4 % IV SOLN
INTRAVENOUS | Status: AC
  Filled 2011-09-15: qty 50

## 2011-09-15 MED ORDER — ZOLPIDEM TARTRATE 5 MG PO TABS: 5.0000 mg | ORAL_TABLET | Freq: Every evening | ORAL | Status: DC | PRN

## 2011-09-15 MED ORDER — LACTATED RINGERS IV SOLN
INTRAVENOUS | Status: DC
  Administered 2011-09-15: 20:00:00 via INTRAVENOUS

## 2011-09-15 MED ORDER — SENNOSIDES-DOCUSATE SODIUM 8.6-50 MG PO TABS
2.0000 | ORAL_TABLET | Freq: Every day | ORAL | Status: DC
  Administered 2011-09-15 – 2011-09-16 (×2): 2 via ORAL

## 2011-09-15 MED ORDER — MIDAZOLAM HCL 2 MG/2ML IJ SOLN: 0.5000 mg | Freq: Once | INTRAMUSCULAR | Status: DC | PRN

## 2011-09-15 MED ORDER — ACETAMINOPHEN 10 MG/ML IV SOLN
1000.0000 mg | Freq: Four times a day (QID) | INTRAVENOUS | Status: AC | PRN
  Filled 2011-09-15: qty 100

## 2011-09-15 MED ORDER — ONDANSETRON HCL 4 MG PO TABS: 4.0000 mg | ORAL_TABLET | ORAL | Status: DC | PRN

## 2011-09-15 MED ORDER — METOCLOPRAMIDE HCL 5 MG/ML IJ SOLN: 10.0000 mg | Freq: Three times a day (TID) | INTRAMUSCULAR | Status: DC | PRN

## 2011-09-15 NOTE — Progress Notes (Signed)
Post Op Day 1 Subjective: no complaints  Objective: Blood pressure 109/71, pulse 98, temperature 98 F (36.7 C), breastfeeding.  Physical Exam:  General: alert Lochia: appropriate Uterine Fundus: firm Incision: no significant drainage   Basename 09/15/11 0515 09/14/11 0127  HGB 9.4* 11.1*  HCT 28.7* 33.4*    Assessment/Plan: Observation   LOS: 1 day   Suzanne Villanueva D 09/15/2011, 11:00 AM

## 2011-09-15 NOTE — Anesthesia Postprocedure Evaluation (Signed)
Anesthesia Post Note  Patient: Suzanne Villanueva  Procedure(s) Performed: Procedure(s) (LRB): CESAREAN SECTION (N/A)  Anesthesia type: Epidural  Patient location: PACU  Post pain: Pain level controlled  Post assessment: Post-op Vital signs reviewed  Last Vitals:  Filed Vitals:   09/14/11 2204  BP: 128/77  Pulse: 81  Temp:   Resp: 20    Post vital signs: Reviewed  Level of consciousness: awake  Complications: No apparent anesthesia complications

## 2011-09-15 NOTE — Anesthesia Postprocedure Evaluation (Signed)
Anesthesia Post Note  Patient: Suzanne Villanueva  Procedure(s) Performed: Procedure(s) (LRB): CESAREAN SECTION (N/A)  Anesthesia type: Epidural  Patient location: Mother/Baby  Post pain: Pain level controlled  Post assessment: Post-op Vital signs reviewed  Last Vitals:  Filed Vitals:   09/15/11 0900  BP: 109/71  Pulse: 98  Temp: 36.7 C  Resp: 18    Post vital signs: Reviewed  Level of consciousness: awake  Complications: No apparent anesthesia complications

## 2011-09-15 NOTE — Addendum Note (Signed)
Addendum  created 09/15/11 1017 by Jhonnie Garner, CRNA   Modules edited:Notes Section

## 2011-09-15 NOTE — Progress Notes (Signed)
UR Chart review completed.  

## 2011-09-16 ENCOUNTER — Encounter (HOSPITAL_COMMUNITY): Payer: Self-pay | Admitting: Obstetrics and Gynecology

## 2011-09-16 MED ORDER — RHO D IMMUNE GLOBULIN 1500 UNIT/2ML IJ SOLN
300.0000 ug | Freq: Once | INTRAMUSCULAR | Status: AC
  Administered 2011-09-16: 300 ug via INTRAMUSCULAR
  Filled 2011-09-16: qty 2

## 2011-09-16 MED ORDER — BISACODYL 10 MG RE SUPP
10.0000 mg | Freq: Every day | RECTAL | Status: DC | PRN
Start: 2011-09-16 — End: 2011-09-17
  Administered 2011-09-16: 10 mg via RECTAL
  Filled 2011-09-16: qty 1

## 2011-09-16 NOTE — Progress Notes (Signed)
Post Op Day 2 Subjective: up ad lib, voiding, tolerating PO and + flatus  Objective: Blood pressure 115/66, pulse 81, temperature 97.8 F (36.6 C),  breastfeeding.  Physical Exam:  General: alert Lochia: appropriate Uterine Fundus: firm Incision: healing well   Basename 09/15/11 0515 09/14/11 0127  HGB 9.4* 11.1*  HCT 28.7* 33.4*    Assessment/Plan: Plan for discharge tomorrow   LOS: 2 days   Suzanne Villanueva D 09/16/2011, 9:11 AM

## 2011-09-17 LAB — RH IG WORKUP (INCLUDES ABO/RH)
ABO/RH(D): A NEG
Antibody Screen: NEGATIVE

## 2011-09-17 MED ORDER — OXYCODONE-ACETAMINOPHEN 5-325 MG PO TABS: 1.0000 | ORAL_TABLET | ORAL | Status: AC | PRN

## 2011-09-17 NOTE — Discharge Summary (Signed)
Obstetric Discharge Summary Reason for Admission: induction of labor Prenatal Procedures: ultrasound Intrapartum Procedures: cesarean: low cervical, transverse Postpartum Procedures: none Complications-Operative and Postpartum: none Hemoglobin  Date Value Range Status  09/15/2011 9.4* 12.0-15.0 (g/dL) Final     HCT  Date Value Range Status  09/15/2011 28.7* 36.0-46.0 (%) Final    Physical Exam:  General: alert Lochia: appropriate Uterine Fundus: firm Incision: healing well  Discharge Diagnoses: Term Pregnancy-delivered, Failed induction and Post-date pregnancy  Discharge Information: Date: 09/17/2011 Activity: Limited Diet: routine Medications: PNV, Ibuprofen and Percocet Condition: stable Instructions: refer to practice specific booklet Discharge to: home Follow-up Information    Follow up with Almon Hercules., MD in 4 weeks.   Contact information:   [address] [place] [address] [phone]          Newborn Data: Live born female  Birth Weight: 8 lb 5.5 oz (3785 g) APGAR: 9, 9  Home with mother.  Aide Wojnar D 09/17/2011, 11:09 AM

## 2011-09-17 NOTE — Discharge Instructions (Signed)

## 2011-09-21 ENCOUNTER — Encounter (HOSPITAL_COMMUNITY): Payer: Self-pay | Admitting: *Deleted

## 2011-09-21 ENCOUNTER — Inpatient Hospital Stay (HOSPITAL_COMMUNITY)
Admission: AD | Admit: 2011-09-21 | Discharge: 2011-09-22 | DRG: 776 | Disposition: A | Discharge status: Home / Self Care | Payer: 59 | Attending: Obstetrics and Gynecology | Admitting: Obstetrics and Gynecology

## 2011-09-21 ENCOUNTER — Inpatient Hospital Stay (HOSPITAL_COMMUNITY): Payer: 59

## 2011-09-21 ENCOUNTER — Other Ambulatory Visit: Payer: Self-pay

## 2011-09-21 DIAGNOSIS — I052 Rheumatic mitral stenosis with insufficiency: Secondary | ICD-10-CM | POA: Diagnosis present

## 2011-09-21 DIAGNOSIS — J811 Chronic pulmonary edema: Secondary | ICD-10-CM | POA: Diagnosis present

## 2011-09-21 DIAGNOSIS — I059 Rheumatic mitral valve disease, unspecified: Secondary | ICD-10-CM

## 2011-09-21 DIAGNOSIS — I501 Left ventricular failure: Secondary | ICD-10-CM

## 2011-09-21 DIAGNOSIS — I2510 Atherosclerotic heart disease of native coronary artery without angina pectoris: Principal | ICD-10-CM | POA: Diagnosis present

## 2011-09-21 DIAGNOSIS — I509 Heart failure, unspecified: Secondary | ICD-10-CM

## 2011-09-21 DIAGNOSIS — E079 Disorder of thyroid, unspecified: Secondary | ICD-10-CM | POA: Diagnosis present

## 2011-09-21 DIAGNOSIS — E039 Hypothyroidism, unspecified: Secondary | ICD-10-CM | POA: Diagnosis present

## 2011-09-21 LAB — CBC
MCV: 92 fL (ref 78.0–100.0)
Platelets: 222 10*3/uL (ref 150–400)
RBC: 2.99 MIL/uL — ABNORMAL LOW (ref 3.87–5.11)
RDW: 13.9 % (ref 11.5–15.5)
WBC: 8.4 10*3/uL (ref 4.0–10.5)

## 2011-09-21 LAB — COMPREHENSIVE METABOLIC PANEL
ALT: 21 U/L (ref 0–35)
AST: 20 U/L (ref 0–37)
Albumin: 2.5 g/dL — ABNORMAL LOW (ref 3.5–5.2)
Alkaline Phosphatase: 71 U/L (ref 39–117)
CO2: 26 mEq/L (ref 19–32)
Chloride: 104 mEq/L (ref 96–112)
Creatinine, Ser: 0.86 mg/dL (ref 0.50–1.10)
GFR calc non Af Amer: >90 mL/min (ref >90)
Potassium: 4.2 mEq/L (ref 3.5–5.1)
Sodium: 137 mEq/L (ref 135–145)
Total Bilirubin: 0.4 mg/dL (ref 0.3–1.2)

## 2011-09-21 LAB — URINALYSIS, ROUTINE W REFLEX MICROSCOPIC
Bilirubin Urine: NEGATIVE
Glucose, UA: NEGATIVE mg/dL
Ketones, ur: NEGATIVE mg/dL
Nitrite: NEGATIVE
Specific Gravity, Urine: 1.015 (ref 1.005–1.030)
pH: 5.5 (ref 5.0–8.0)

## 2011-09-21 LAB — URINE MICROSCOPIC-ADD ON

## 2011-09-21 LAB — PRO B NATRIURETIC PEPTIDE: Pro B Natriuretic peptide (BNP): 1175 pg/mL — ABNORMAL HIGH (ref 0–125)

## 2011-09-21 LAB — LACTATE DEHYDROGENASE: LDH: 222 U/L (ref 94–250)

## 2011-09-21 MED ORDER — SODIUM CHLORIDE 0.9 % IJ SOLN
3.0000 mL | INTRAMUSCULAR | Status: DC | PRN
  Administered 2011-09-21: 3 mL via INTRAVENOUS
  Filled 2011-09-21: qty 3

## 2011-09-21 MED ORDER — FUROSEMIDE 10 MG/ML IJ SOLN
40.0000 mg | Freq: Once | INTRAMUSCULAR | Status: AC
  Administered 2011-09-21: 40 mg via INTRAVENOUS
  Filled 2011-09-21: qty 4

## 2011-09-21 MED ORDER — SODIUM CHLORIDE 0.9 % IJ SOLN: 3.0000 mL | Freq: Two times a day (BID) | INTRAMUSCULAR | Status: DC

## 2011-09-21 MED ORDER — OXYCODONE-ACETAMINOPHEN 5-325 MG PO TABS
2.0000 | ORAL_TABLET | Freq: Four times a day (QID) | ORAL | Status: DC | PRN
Start: 2011-09-21 — End: 2011-09-22
  Administered 2011-09-21 – 2011-09-22 (×2): 2 via ORAL
  Filled 2011-09-21 (×2): qty 2

## 2011-09-21 MED ORDER — OXYCODONE-ACETAMINOPHEN 5-325 MG PO TABS
2.0000 | ORAL_TABLET | Freq: Once | ORAL | Status: AC
  Administered 2011-09-21: 2 via ORAL
  Filled 2011-09-21: qty 2

## 2011-09-21 MED ORDER — SODIUM CHLORIDE 0.9 % IJ SOLN: 3.0000 mL | INTRAMUSCULAR | Status: DC | PRN

## 2011-09-21 MED ORDER — ONDANSETRON HCL 4 MG/2ML IJ SOLN: 4.0000 mg | Freq: Four times a day (QID) | INTRAMUSCULAR | Status: DC | PRN

## 2011-09-21 MED ORDER — ALBUTEROL SULFATE (5 MG/ML) 0.5% IN NEBU
5.0000 mg | INHALATION_SOLUTION | Freq: Once | RESPIRATORY_TRACT | Status: AC
  Administered 2011-09-21: 5 mg via RESPIRATORY_TRACT
  Filled 2011-09-21: qty 0.5

## 2011-09-21 MED ORDER — ACETAMINOPHEN 325 MG PO TABS: 650.0000 mg | ORAL_TABLET | ORAL | Status: DC | PRN

## 2011-09-21 MED ORDER — SODIUM CHLORIDE 0.9 % IV SOLN
250.0000 mL | INTRAVENOUS | Status: DC | PRN
  Administered 2011-09-21: 1000 mL via INTRAVENOUS

## 2011-09-21 MED ORDER — LEVOTHYROXINE SODIUM 100 MCG PO TABS
100.0000 ug | ORAL_TABLET | Freq: Every day | ORAL | Status: DC
Start: 2011-09-22 — End: 2011-09-22
  Administered 2011-09-22: 100 ug via ORAL
  Filled 2011-09-21: qty 1

## 2011-09-21 MED ORDER — IBUPROFEN 600 MG PO TABS: 600.0000 mg | ORAL_TABLET | Freq: Four times a day (QID) | ORAL | Status: DC | PRN

## 2011-09-21 NOTE — H&P (Signed)
31 y.o. complains of SOB with lying down worse starting this morning. Pt was admitted for a post term induction and underwent c/s for failed induction 3/28.  She was discharged in stable condition 3/31.  Past Medical History  Diagnosis Date  . Hypothyroidism   . GERD (gastroesophageal reflux disease)   . Abnormal Pap smear   . Complication of anesthesia     Getsvery emotional  Morbid obesity  Past Surgical History  Procedure Date  . Tonsillectomy   . Wisdom tooth extraction   . Myringotomy   . Cesarean section 09/14/2011    Procedure: CESAREAN SECTION;  Surgeon: Freddrick March. Tenny Craw, MD;  Location: [facility];  Service: Gynecology;  Laterality: N/A;  Primary Cesarean Section with birth of baby girl @ 2302    Family Hx: Heart disease and HTN  No current facility-administered medications on file prior to encounter.   Current Outpatient Prescriptions on File Prior to Encounter  Medication Sig Dispense Refill  . levothyroxine (SYNTHROID, LEVOTHROID) 100 MCG tablet Take 100 mcg by mouth daily.        Marland Kitchen oxyCODONE-acetaminophen (PERCOCET) 5-325 MG per tablet Take 1-2 tablets by mouth every 3 (three) hours as needed (moderate - severe pain).  20 tablet  0    Allergies  Allergen Reactions  . Other Swelling and Other (See Comments)    Antibiotic eye drops (pt not sure which kind) caused red swollen eye    @VITALS2 @ Gen: NAD, talking comfortably while in seated position Lungs: clear to ascultation Cor:  RRR Abdomen:  soft, nontender, nondistended. Ex:  no cords, erythema   A:  Postpartum heart failure   P:  Admit to inpatient AICU 1) Heart failure: r/o cardiomyopathy - 2D echo, 40mg  Lasix, s/p cardiology consult, telemetry, heart diet, monitoring per protocol 2) Hypothyroid: cont Levothyroxine 3) DVT prophylaxis 4) Percocet prn postoperative pain  Gracie Gupta

## 2011-09-21 NOTE — MAU Note (Signed)
Pt reports C/S 03/28, has been having shortness of breath x 24 hours, denies fever, is pumping without problems

## 2011-09-21 NOTE — Consult Note (Signed)
CARDIOLOGY CONSULT NOTE  Patient ID: Suzanne Villanueva MRN: [id number] DOB/AGE: Jul 29, 1980 29 y.o.  Admit date: 09/21/2011 Referring Physician: Dr Waynard Reeds Primary Physician Primary Cardiologist: new - will be Dr Excell Seltzer Reason for Consultation: CHF  HPI: This is a very nice 31 year old woman who presented to [facility] with shortness of breath. She works as a Product/process development scientist. The patient is G1 P1, now one week out from delivery of a healthy girl. She had a C-section performed because of failure to progress. She had an uncomplicated recovery and was discharged home on day 3. She felt well until yesterday when she developed shortness of breath. The patient awoke from a nap and was short of breath. She felt a fullness in her chest and had difficulty catching her breath. She got up and walked around and felt a little better. This morning she noted progressive shortness of breath and inability to lie flat. Her dyspnea was exacerbated by moving around and she was short of breath with minimal activity. She feels okay when sitting still and she has no symptoms at the moment while she is sitting up in bed. She specifically denies chest pain or chest pressure. She's had leg swelling that has not resolved over the past week. She describes orthopnea and PND as above. She's had no palpitations, lightheadedness, or syncope. She has no past history of cardiac problems. She's had no cough, fever, chills, or hemoptysis. She notes that her blood pressure was mildly elevated near the end of her pregnancy, but she never required antihypertensive medication and she did not have preeclampsia.  Past Medical History  Diagnosis Date  . Hypothyroidism   . GERD (gastroesophageal reflux disease)   . Abnormal Pap smear   . Complication of anesthesia     Getsvery emotional     Past Surgical History  Procedure Date  . Tonsillectomy   . Wisdom tooth extraction   . Myringotomy   . Cesarean section  09/14/2011    Procedure: CESAREAN SECTION;  Surgeon: Freddrick March. Tenny Craw, MD;  Location: [facility];  Service: Gynecology;  Laterality: N/A;  Primary Cesarean Section with birth of baby girl @ 2302     History reviewed. No pertinent family history.  Social History: History   Social History  . Marital Status: Married    Spouse Name: N/A    Number of Children: N/A  . Years of Education: N/A   Occupational History  . Not on file.   Social History Main Topics  . Smoking status: Former Smoker -- 0.5 packs/day    Quit date: 01/14/2011  . Smokeless tobacco: Never Used  . Alcohol Use: No  . Drug Use: No  . Sexually Active: Yes    Birth Control/ Protection: None   Other Topics Concern  . Not on file   Social History Narrative  . No narrative on file     Prescriptions prior to admission  Medication Sig Dispense Refill  . ibuprofen (ADVIL,MOTRIN) 200 MG tablet Take 400 mg by mouth every 6 (six) hours as needed. For pain      . levothyroxine (SYNTHROID, LEVOTHROID) 100 MCG tablet Take 100 mcg by mouth daily.        Marland Kitchen oxyCODONE-acetaminophen (PERCOCET) 5-325 MG per tablet Take 1-2 tablets by mouth every 3 (three) hours as needed (moderate - severe pain).  20 tablet  0  . Prenatal Vit-Fe Fumarate-FA (PRENATAL MULTIVITAMIN) TABS Take 1 tablet by mouth every morning.      Marland Kitchen  ranitidine (ZANTAC) 150 MG tablet Take 150 mg by mouth daily as needed. For heartburn        ROS: General: no fevers/chills/night sweats Eyes: no blurry vision, diplopia, or amaurosis ENT: no sore throat or hearing loss Resp: no cough, wheezing, or hemoptysis CV: no palpitations - see history of present illness GI: no abdominal pain, nausea, vomiting, diarrhea, or constipation GU: no dysuria, frequency, or hematuria Skin: no rash Neuro: no headache, numbness, tingling, or weakness of extremities Musculoskeletal: no joint pain or swelling Heme: no bleeding, DVT, or easy bruising Endo: no polydipsia or polyuria     Physical Exam: Blood pressure 144/80, pulse 72, temperature 97.3 F (36.3 C), temperature source Oral, resp. rate 20, height 5\' 8"  (1.727 m), weight 157.398 kg (347 lb), SpO2 100.00%.  Pt is alert and oriented, WD, WN, obese woman in no distress. HEENT: normal Neck: JVP mildly elevated. Carotid upstrokes normal without bruits. No thyromegaly. Lungs: equal expansion, clear bilaterally CV: Apex is discrete and nondisplaced, RRR with grade 2/6 systolic ejection murmur along the left sternal border and right upper sternal border  Abd: soft, NT, +BS Back: no CVA tenderness Ext: 1+ bilateral pretibial edema        DP/PT pulses intact and = Skin: warm and dry without rash Neuro: CNII-XII intact             Strength intact = bilaterally  Labs:   Lab Results  Component Value Date   WBC 8.4 09/21/2011   HGB 8.8* 09/21/2011   HCT 27.5* 09/21/2011   MCV 92.0 09/21/2011   PLT 222 09/21/2011    Lab 09/21/11 0704  NA 137  K 4.2  CL 104  CO2 26  BUN 14  CREATININE 0.86  CALCIUM 8.4  PROT 6.1  BILITOT 0.4  ALKPHOS 71  ALT 21  AST 20  GLUCOSE 95   No results found for this basename: CKTOTAL, CKMB, CKMBINDEX, TROPONINI    No results found for this basename: CHOL   No results found for this basename: HDL   No results found for this basename: LDLCALC   No results found for this basename: TRIG   No results found for this basename: CHOLHDL   No results found for this basename: LDLDIRECT      Radiology: Dg Chest 2 View  09/21/2011  *RADIOLOGY REPORT*  Clinical Data: Shortness of breath, postpartum  CHEST - 2 VIEW  Comparison: 07/19/2009  Findings: Borderline cardiac enlargement with basilar interstitial changes and septal thickening compatible with volume overload/mild edema.  No effusion, collapse, consolidation, or pneumothorax. Trachea midline.  IMPRESSION: Basilar interstitial changes compatible with volume overload/mild edema.  Original Report Authenticated By: Judie Petit. Ruel Favors, M.D.     EKG: Normal sinus rhythm, within normal limits  ASSESSMENT AND PLAN: This is a 31 year old woman with acute heart failure. Her clinical symptoms, chest x-ray, and elevated BNP are all consistent with this diagnosis. This is possibly the result of postpartum volume shifts in this patient with underlying obesity. She notes that she received 4 L of lactated Ringer's at the time of her delivery. However, postpartum cardiomyopathy has to be ruled out and a 2-D echocardiogram will be ordered. I will give the patient furosemide 40 mg IV x1 to diurese her. I will not start any other medication for congestive heart failure until we see the results of her echocardiogram. Will monitor her blood pressure overnight and hopefully she will be appropriate for discharge tomorrow after diuresis. I suspect she will need  several days of oral furosemide as well. Further plans pending the results of the patient's echo and clinical response to diuresis. Will closely monitor I/O's and daily weights.  Tonny Bollman 09/21/2011, 1:11 PM

## 2011-09-21 NOTE — MAU Provider Note (Signed)
History     CSN: [id number]  Arrival date and time: 09/21/11 0630   None     Chief Complaint  Patient presents with  . Shortness of Breath   HPI 31 y.o. G1P1001 at 1 week postpartum s/p c-section for failure to progress. C/O shortness of breath since discharge with laying down, worse this morning. Feels congested when supine or side lying. No cough or cold symptoms, no h/o asthma. Uncomplicated prenatal course.    Past Medical History  Diagnosis Date  . Hypothyroidism   . GERD (gastroesophageal reflux disease)   . Abnormal Pap smear   . Complication of anesthesia     Getsvery emotional    Past Surgical History  Procedure Date  . Tonsillectomy   . Wisdom tooth extraction   . Myringotomy   . Cesarean section 09/14/2011    Procedure: CESAREAN SECTION;  Surgeon: Freddrick March. Tenny Craw, MD;  Location: [facility];  Service: Gynecology;  Laterality: N/A;  Primary Cesarean Section with birth of baby girl @ 2302    History reviewed. No pertinent family history.  History  Substance Use Topics  . Smoking status: Former Smoker -- 0.5 packs/day    Quit date: 01/14/2011  . Smokeless tobacco: Never Used  . Alcohol Use: No    Allergies:  Allergies  Allergen Reactions  . Other Swelling and Other (See Comments)    Antibiotic eye drops (pt not sure which kind) caused red swollen eye    Prescriptions prior to admission  Medication Sig Dispense Refill  . ibuprofen (ADVIL,MOTRIN) 200 MG tablet Take 400 mg by mouth every 6 (six) hours as needed. For pain      . levothyroxine (SYNTHROID, LEVOTHROID) 100 MCG tablet Take 100 mcg by mouth daily.        Marland Kitchen oxyCODONE-acetaminophen (PERCOCET) 5-325 MG per tablet Take 1-2 tablets by mouth every 3 (three) hours as needed (moderate - severe pain).  20 tablet  0  . Prenatal Vit-Fe Fumarate-FA (PRENATAL MULTIVITAMIN) TABS Take 1 tablet by mouth every morning.      . ranitidine (ZANTAC) 150 MG tablet Take 150 mg by mouth daily as needed. For heartburn         Review of Systems  Constitutional: Negative.   HENT: Negative for congestion and sore throat.   Respiratory: Positive for shortness of breath. Negative for cough and sputum production.   Cardiovascular: Negative.  Negative for chest pain.  Gastrointestinal: Negative for nausea, vomiting, abdominal pain, diarrhea and constipation.  Genitourinary: Negative for dysuria, urgency, frequency, hematuria and flank pain.  Musculoskeletal: Negative.   Neurological: Negative.   Psychiatric/Behavioral: Negative.    Physical Exam   Blood pressure 137/82, pulse 61, temperature 97.3 F (36.3 C), temperature source Oral, resp. rate 20, height 5\' 8"  (1.727 m), weight 347 lb (157.398 kg), SpO2 100.00%.  Physical Exam  Nursing note and vitals reviewed. Constitutional: She is oriented to person, place, and time. She appears well-developed and well-nourished. No distress.  Cardiovascular: Normal rate and regular rhythm.  Exam reveals no gallop and no friction rub.   No murmur heard. Respiratory: Effort normal. No respiratory distress. She has no decreased breath sounds. She has wheezes (right expiratory). She has no rhonchi. She has no rales.  Musculoskeletal: Normal range of motion. Edema: +2.  Neurological: She is alert and oriented to person, place, and time.  Skin: Skin is warm and dry.  Psychiatric: She has a normal mood and affect.   Extremities: no erythema or tenderness  MAU Course  Procedures Results for orders placed during the hospital encounter of 09/21/11 (from the past 24 hour(s))  CBC     Status: Abnormal   Collection Time   09/21/11  7:04 AM      Component Value Range   WBC 8.4  4.0 - 10.5 (K/uL)   RBC 2.99 (*) 3.87 - 5.11 (MIL/uL)   Hemoglobin 8.8 (*) 12.0 - 15.0 (g/dL)   HCT 16.1 (*) 09.6 - 46.0 (%)   MCV 92.0  78.0 - 100.0 (fL)   MCH 29.4  26.0 - 34.0 (pg)   MCHC 32.0  30.0 - 36.0 (g/dL)   RDW 04.5  40.9 - 81.1 (%)   Platelets 222  150 - 400 (K/uL)  COMPREHENSIVE  METABOLIC PANEL     Status: Abnormal   Collection Time   09/21/11  7:04 AM      Component Value Range   Sodium 137  135 - 145 (mEq/L)   Potassium 4.2  3.5 - 5.1 (mEq/L)   Chloride 104  96 - 112 (mEq/L)   CO2 26  19 - 32 (mEq/L)   Glucose, Bld 95  70 - 99 (mg/dL)   BUN 14  6 - 23 (mg/dL)   Creatinine, Ser 9.14  0.50 - 1.10 (mg/dL)   Calcium 8.4  8.4 - 78.2 (mg/dL)   Total Protein 6.1  6.0 - 8.3 (g/dL)   Albumin 2.5 (*) 3.5 - 5.2 (g/dL)   AST 20  0 - 37 (U/L)   ALT 21  0 - 35 (U/L)   Alkaline Phosphatase 71  39 - 117 (U/L)   Total Bilirubin 0.4  0.3 - 1.2 (mg/dL)   GFR calc non Af Amer >90  >90 (mL/min)   GFR calc Af Amer >90  >90 (mL/min)  URIC ACID     Status: Abnormal   Collection Time   09/21/11  7:04 AM      Component Value Range   Uric Acid, Serum 7.5 (*) 2.4 - 7.0 (mg/dL)  LACTATE DEHYDROGENASE     Status: Normal   Collection Time   09/21/11  7:04 AM      Component Value Range   LD 222  94 - 250 (U/L)  URINALYSIS, ROUTINE W REFLEX MICROSCOPIC     Status: Abnormal   Collection Time   09/21/11  8:09 AM      Component Value Range   Color, Urine YELLOW  YELLOW    APPearance CLEAR  CLEAR    Specific Gravity, Urine 1.015  1.005 - 1.030    pH 5.5  5.0 - 8.0    Glucose, UA NEGATIVE  NEGATIVE (mg/dL)   Hgb urine dipstick TRACE (*) NEGATIVE    Bilirubin Urine NEGATIVE  NEGATIVE    Ketones, ur NEGATIVE  NEGATIVE (mg/dL)   Protein, ur NEGATIVE  NEGATIVE (mg/dL)   Urobilinogen, UA 0.2  0.0 - 1.0 (mg/dL)   Nitrite NEGATIVE  NEGATIVE    Leukocytes, UA NEGATIVE  NEGATIVE   URINE MICROSCOPIC-ADD ON     Status: Normal   Collection Time   09/21/11  8:09 AM      Component Value Range   Squamous Epithelial / LPF RARE  RARE    WBC, UA 0-2  <3 (WBC/hpf)   RBC / HPF 0-2  <3 (RBC/hpf)   Bacteria, UA RARE  RARE    Urine-Other MUCOUS PRESENT     Dr. Tenny Craw informed of patient status, chest xray, labs and albuterol nebulizer ordered.    Assessment and Plan  Care assumed by Mayer Camel,  NP  Alexi Geibel 09/21/2011, 9:04 AM   Dg Chest 2 View  09/21/2011  *RADIOLOGY REPORT*  Clinical Data: Shortness of breath, postpartum  CHEST - 2 VIEW  Comparison: 07/19/2009  Findings: Borderline cardiac enlargement with basilar interstitial changes and septal thickening compatible with volume overload/mild edema.  No effusion, collapse, consolidation, or pneumothorax. Trachea midline.  IMPRESSION: Basilar interstitial changes compatible with volume overload/mild edema.  Original Report Authenticated By: Judie Petit. Ruel Favors, M.D.   09:30 am Patient reexamined and no wheezing heard at this time. States she feels better until lying flat and then feels short of breath again.   Consult with Dr. Claiborne Billings and she request EKG, BNP and Cardiology Consult.  10:06 am. Consult with [facility] and someone will come to MAU for consult. BNP pending, EKG normal.  13:00 Dr. Excell Seltzer with [facility] in to evaluate patient. Patient to be admitted to [facility] and will have echo cardiogram. Dr. Claiborne Billings to write admission orders.

## 2011-09-21 NOTE — Progress Notes (Signed)
  Echocardiogram 2D Echocardiogram has been performed.  Jorje Guild [facility] 09/21/2011, 2:06 PM

## 2011-09-22 DIAGNOSIS — I501 Left ventricular failure: Secondary | ICD-10-CM | POA: Diagnosis present

## 2011-09-22 DIAGNOSIS — I509 Heart failure, unspecified: Secondary | ICD-10-CM

## 2011-09-22 LAB — BASIC METABOLIC PANEL
BUN: 13 mg/dL (ref 6–23)
CO2: 26 mEq/L (ref 19–32)
Chloride: 105 mEq/L (ref 96–112)
Creatinine, Ser: 0.92 mg/dL (ref 0.50–1.10)
Glucose, Bld: 97 mg/dL (ref 70–99)

## 2011-09-22 NOTE — Progress Notes (Signed)
Pt states that she feels fine. Breathing easily. Cardiology has given ok to discharge. Will follow with them in one week. Plan/ Discharge to home.

## 2011-09-22 NOTE — Progress Notes (Signed)
   CARE MANAGEMENT NOTE 09/22/2011  Patient:  Suzanne Villanueva, Suzanne Villanueva   Account Number:  [id number]  Date Initiated:  09/22/2011  Documentation initiated by:  Roseanne Reno  Subjective/Objective Assessment:   Heart Failure     Action/Plan:   Heart Failure Home Health Screen   Anticipated DC Date:  09/22/2011   Anticipated DC Plan:  HOME/SELF CARE      DC Planning Services  CM consult      Choice offered to / List presented to:             Status of service:  Completed, signed off   Discharge Disposition:  Home/Self Care  Comments:  09/22/11  930a  CM consult noted for Heart Failure Home Health Screen.  Spoke w/ pt about Heart Failure Protocol - Nursing visits, scales, follow up etc.  Pt stated that she really didn't feel like she needed that, stating that she would be on Lasix for only 5 days and that she would be checking her BP at home.  If this were going to be a long term issue then she would consider the HF Protocol w/ [facility]. 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  Encourage pt to call MD if any questions or concerns.  CM available to assist as needed. TJohnson, RNBSN [phone]

## 2011-09-22 NOTE — Progress Notes (Signed)
Patient given prescriptions and discharge instructions, questions answered.

## 2011-09-22 NOTE — Progress Notes (Signed)
    Subjective:  Feels better. Breathing improved. No chest pain.  Objective:  Vital Signs in the last 24 hours: Temp:  [97.8 F (36.6 C)-98.7 F (37.1 C)] 97.8 F (36.6 C) (04/05 0000) Pulse Rate:  [58-96] 62  (04/05 0700) Resp:  [16-20] 16  (04/05 0600) BP: (116-147)/(67-86) 145/79 mmHg (04/05 0600) SpO2:  [94 %-100 %] 98 % (04/05 0700) Weight:  [152.635 kg (336 lb 8 oz)-154.365 kg (340 lb 5 oz)] 152.635 kg (336 lb 8 oz) (04/05 0600)  Intake/Output from previous day: 04/04 0701 - 04/05 0700 In: 403 [P.O.:400; I.V.:3] Out: 5900 [Urine:5900]  Physical Exam: Pt is alert and oriented, obese woman in NAD HEENT: normal Neck: JVP - normal, carotids 2+= without bruits Lungs: CTA bilaterally CV: RRR with 2/6 systolic murmur LSB Abd: soft, NT, Positive BS Ext: 1+ pretib edema Skin: warm/dry no rash  Lab Results:  [facility] 09/21/11 0704  WBC 8.4  HGB 8.8*  PLT 222    Basename 09/22/11 0510 09/21/11 0704  NA 141 137  K 3.7 4.2  CL 105 104  CO2 26 26  GLUCOSE 97 95  BUN 13 14  CREATININE 0.92 0.86   No results found for this basename: TROPONINI:2,CK,MB:2 in the last 72 hours  Cardiac Studies: 2D Echo: Study Conclusions  - Left ventricle: Systolic function was normal. The estimated ejection fraction was in the range of 60% to 65%. - Aortic valve: Trivial regurgitation. - Mitral valve: Moderate regurgitation. - Left atrium: The atrium was mildly dilated.  Tele: sinus rhythm  Assessment/Plan:  Acute pulmonary edema, post-partum. Echo reviewed and pat has normal LV function with moderate MR. Suspect CHF related to fluid shifts postpartum, obesity, HTN, and possibly mitral regurgitation. She has had an excellent diuresis with IV furosemide and is clinically improved. BP is borderline. I think she is stable for hospital discharge today. We discussed the importance of weight loss/diet/BP control and the risk of long-term risk of developing problems related to diastolic  heart failure, HTN, etc. The patient is well-educated and understands this. Recommend the following:  1. Furosemide 20 mg PO daily x 5 days 2. KCl 20 meq daily x 5 days 3. Obtain a home BP cuff and monitor at least once daily. Call if BP consistently greater than 140/90 4. Follow-up with Tereso Newcomer, PA in one week at the [facility] office [address] [place] [address]. I will arrange appt and we will contact her. Also will arrange follow-up BMET next week. 5. Will plan on repeat echo in 6 months to evaluate mitral regurgitation.  thx for asking [place] to see this nice patient and please call if any questions ([phone])  Tonny Bollman, M.D. 09/22/2011, 8:24 AM

## 2011-09-22 NOTE — Discharge Summary (Signed)
[name], [name]                ACCOUNT NO.:  [id number]  MEDICAL RECORD NO.:  [id number]  LOCATION:  9372                          FACILITY:  [facility]  PHYSICIAN:  Malva Limes, M.D.    DATE OF BIRTH:  04-13-1981  DATE OF ADMISSION:  09/21/2011 DATE OF DISCHARGE:  09/22/2011                              DISCHARGE SUMMARY   PRINCIPAL DISCHARGE DIAGNOSES: 1. Pulmonary edema. 2. Moderate mitral regurgitation. 3. Status post low-transverse cesarean section.  HISTORY OF PRESENT ILLNESS:  [name] is a 31 year old white female, G1, P1, who underwent a primary low-transverse cesarean section on September 14, 2011, for failure to progress.  The patient was discharged to home on September 17, 2011, without difficulty.  She presented to the hospital on September 21, 2011, complaining of shortness of breath when she laid down. On admission, the patient was admitted to the ICU, a Cardiology consult was obtained.  A 2D echo revealed normal function with moderate mitral regurgitation.  The patient underwent an extensive diuresis, voiding 4 liters of fluid.  Cardiology gave the okay to discharge the patient to home on hospital day #2.  She will follow up with him in 1 week.  At time of discharge, the patient's lungs appeared to be clear.  She had no shortness of breath.  She was sent home with Lasix 20 mg p.o. q.a.m. x5 days and potassium chloride 20 mg p.o. q.a.m. x5 days.  The patient's incision was healing well at the time of discharge.  She had no complaints.  She was afebrile.  Vital signs were stable.          ______________________________ Malva Limes, M.D.     MA/MEDQ  D:  09/22/2011  T:  09/22/2011  Job:  [id number]

## 2011-09-22 NOTE — Progress Notes (Addendum)
O2 sat alarm ringing when pt. Falls asleep and sats drop below 94%. Pt. Request to have [place] O2 put on so that she can sleep. @L  [place] O2 applied. Lungs CTA bilaterally

## 2011-09-22 NOTE — Plan of Care (Signed)
Problem: Phase I Progression Outcomes Goal: EF % per last Echo/documented,Core Reminder form on chart Outcome: Completed/Met Date Met:  09/22/11 60-65%

## 2011-09-29 ENCOUNTER — Encounter: Payer: Self-pay | Admitting: *Deleted

## 2011-10-02 ENCOUNTER — Other Ambulatory Visit: Payer: 59

## 2011-10-02 ENCOUNTER — Encounter: Payer: Self-pay | Admitting: Physician Assistant

## 2011-10-02 ENCOUNTER — Ambulatory Visit (INDEPENDENT_AMBULATORY_CARE_PROVIDER_SITE_OTHER): Payer: 59 | Admitting: Physician Assistant

## 2011-10-02 VITALS — BP 120/78 | HR 58 | Ht 68.0 in | Wt 313.0 lb

## 2011-10-02 DIAGNOSIS — I501 Left ventricular failure: Secondary | ICD-10-CM

## 2011-10-02 DIAGNOSIS — I340 Nonrheumatic mitral (valve) insufficiency: Secondary | ICD-10-CM

## 2011-10-02 DIAGNOSIS — I059 Rheumatic mitral valve disease, unspecified: Secondary | ICD-10-CM

## 2011-10-02 DIAGNOSIS — I509 Heart failure, unspecified: Secondary | ICD-10-CM

## 2011-10-02 LAB — BASIC METABOLIC PANEL
BUN: 12 mg/dL (ref 6–23)
CO2: 26 mEq/L (ref 19–32)
Calcium: 9.2 mg/dL (ref 8.4–10.5)
Creatinine, Ser: 1.1 mg/dL (ref 0.4–1.2)
Glucose, Bld: 72 mg/dL (ref 70–99)
Sodium: 140 mEq/L (ref 135–145)

## 2011-10-02 NOTE — Patient Instructions (Addendum)
Your physician recommends that you return for lab work in: today (bmet, bnp)  Your physician wants you to follow-up in: 6 months with Dr Excell Seltzer.   You will receive a reminder letter in the mail two months in advance. If you don't receive a letter, please call our office to schedule the follow-up appointment.  Your physician has requested that you have an echocardiogram in 6 months prior to the appt with Dr Excell Seltzer. Echocardiography is a painless test that uses sound waves to create images of your heart. It provides your doctor with information about the size and shape of your heart and how well your heart's chambers and valves are working. This procedure takes approximately one hour. There are no restrictions for this procedure.

## 2011-10-02 NOTE — Progress Notes (Signed)
[address] [place], [place]  [address] Phone: [phone] Fax:  [phone]  Date:  10/02/2011   Name:  Suzanne Villanueva       DOB:  May 18, 1981 MRN:  [id number]  PCP:  Ron Parker, MD, MD  Primary Cardiologist:  Dr. Tonny Bollman  Primary Electrophysiologist:  None    History of Present Illness: Suzanne Villanueva is a 31 y.o. female who presents for post hospital follow up.  She was admitted 4/4-4/5 with post-partum acute pulmonary edema/CHF occurring 7 days after c-section.    2D Echocardiogram 09/21/11: Study Conclusions  - Left ventricle: Systolic function was normal. The estimated ejection fraction was in the range of 60% to 65%. - Aortic valve: Trivial regurgitation. - Mitral valve: Moderate regurgitation. - Left atrium: The atrium was mildly dilated  It was felt that her volume overload was likely related to fluid shifts post partum, obesity, HTN, and, possibly, mitral regurgitation.  She had excellent response to IV Lasix.  She was seen by Dr. Tonny Bollman who recommended 5 days of Lasix and K+ after discharge.  She was to also keep a close eye on her BP, which was borderline.  She will need a repeat echo in 6 mos.  Doing well.  Feels back to normal.  No chest pain, dyspnea, PND, orthopnea, LE edema.  Lactation ceased on Lasix.  Baby is now formula fed.    Past Medical History  Diagnosis Date  . Hypothyroidism   . GERD (gastroesophageal reflux disease)   . Abnormal Pap smear   . Complication of anesthesia     Getsvery emotional  . History of CHF (congestive heart failure)     post partum 09/2011 - echo 09/21/11: EF 60-65%, mod MR, mild LAE (due to post partum fluid shifts, HTN, obesity, ?MR)  . Moderate mitral regurgitation     needs echo 03/2012    Current Outpatient Prescriptions  Medication Sig Dispense Refill  . ibuprofen (ADVIL,MOTRIN) 200 MG tablet Take 400 mg by mouth every 6 (six) hours as needed. For pain      .  levothyroxine (SYNTHROID, LEVOTHROID) 100 MCG tablet Take 100 mcg by mouth daily.        . Prenatal Vit-Fe Fumarate-FA (PRENATAL MULTIVITAMIN) TABS Take 1 tablet by mouth every morning.        Allergies: Allergies  Allergen Reactions  . Other Swelling and Other (See Comments)    Antibiotic eye drops (pt not sure which kind) caused red swollen eye    History  Substance Use Topics  . Smoking status: Former Smoker -- 0.5 packs/day    Quit date: 01/14/2011  . Smokeless tobacco: Never Used  . Alcohol Use: No     PHYSICAL EXAM: VS:  BP 120/78  Pulse 58  Ht 5\' 8"  (1.727 m)  Wt 313 lb (141.976 kg)  BMI 47.59 kg/m2 Well nourished, well developed, in no acute distress HEENT: normal Neck: no JVD Cardiac:  normal S1, S2; RRR; no murmur Lungs:  clear to auscultation bilaterally, no wheezing, rhonchi or rales Abd: soft, nontender, no hepatomegaly Ext: no edema Skin: warm and dry Neuro:  CNs 2-12 intact, no focal abnormalities noted  EKG:  Sinus brady, HR 58, normal axis, non-specific ST changes   ASSESSMENT AND PLAN:  1. Pulmonary edema w/congestive heart failure w/preserved LV function  Resolved.  Check BMET and BNP today.  BP normal today.  She will keep an eye on this.  We discussed watching salt and  weight loss.  She will follow up with Dr. Tonny Bollman in 6 mos.     2. Moderate mitral regurgitation  Follow up echo in 6 mos.    Signed, Tereso Newcomer, PA-C  2:13 PM 10/02/2011

## 2011-10-04 ENCOUNTER — Encounter: Payer: Self-pay | Admitting: Cardiovascular Disease

## 2011-10-04 NOTE — Telephone Encounter (Signed)
This encounter was created in error - please disregard.

## 2011-10-04 NOTE — Telephone Encounter (Signed)
New msg Pt wants know results of lab work she had done

## 2012-03-12 IMAGING — US US OB FOLLOW-UP
1 series · 14 of 28 positions shown · non-contrast
Comparison: none

[Series 1: us ob follow-up · 0.15mm/px · 14 of 93 slices shown]
[im 4/93]
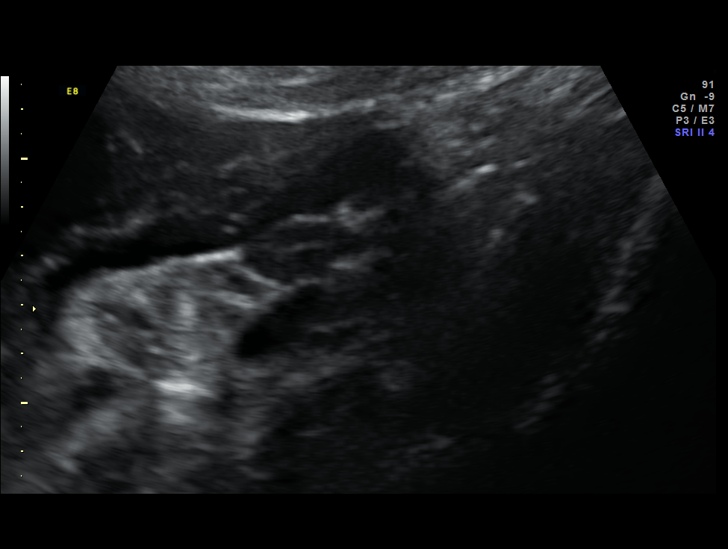
[im 11/93]
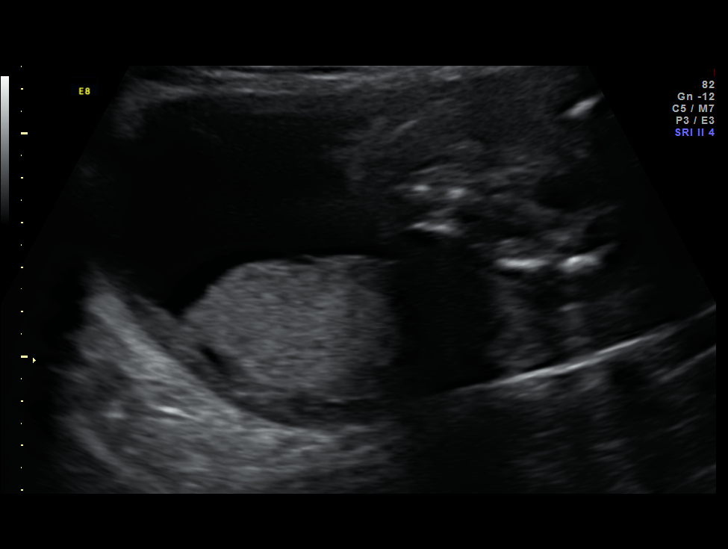
[im 18/93]
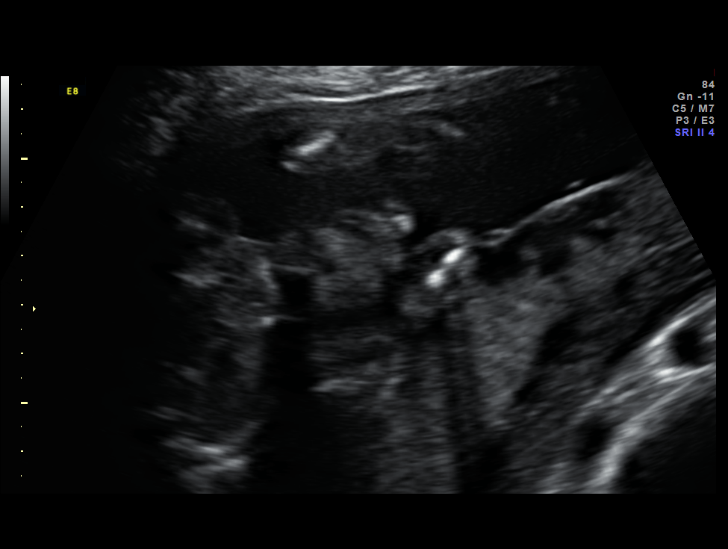
[im 24/93]
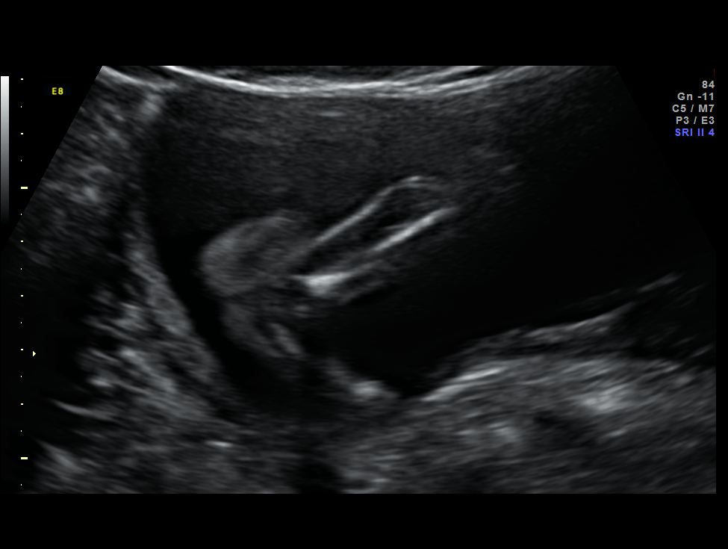
[im 31/93]
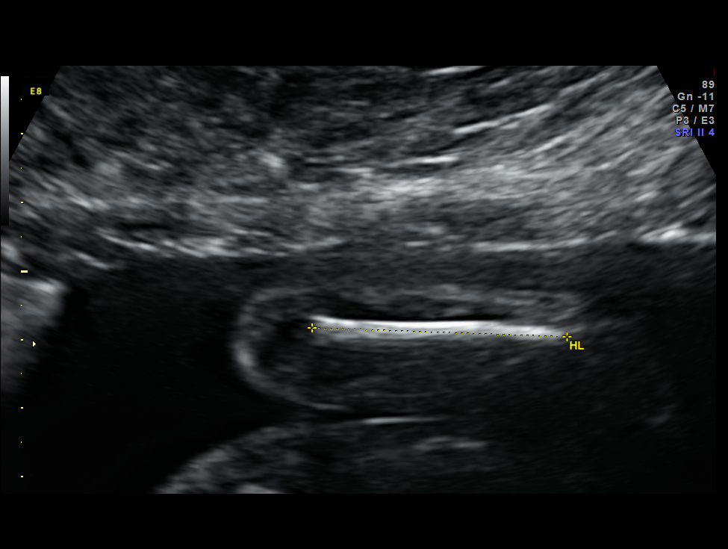
[im 38/93]
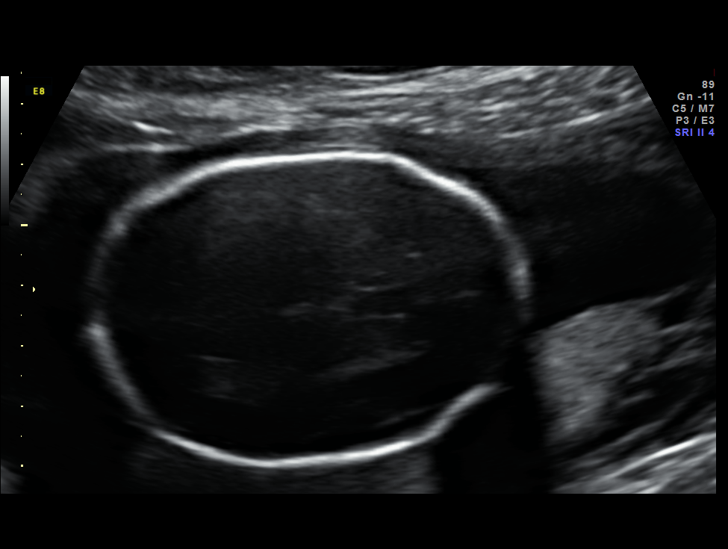
[im 45/93]
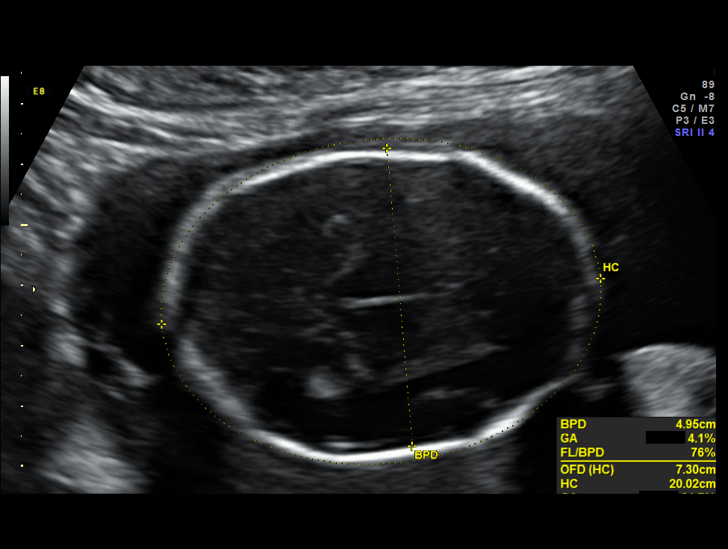
[im 52/93]
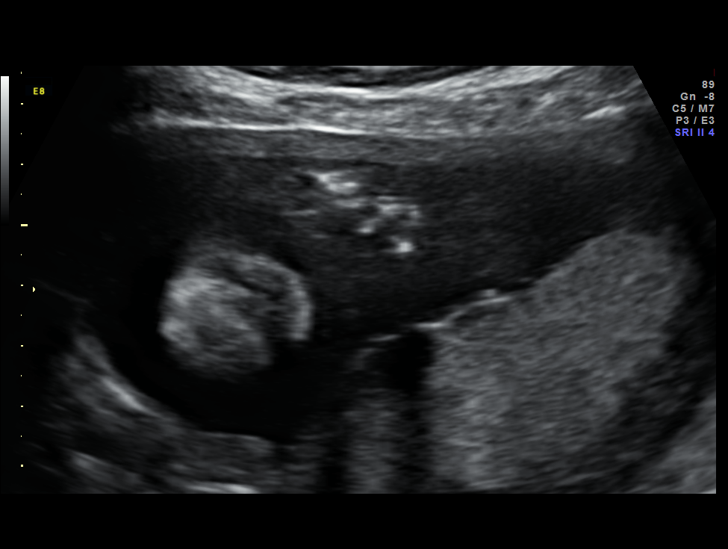
[im 58/93]
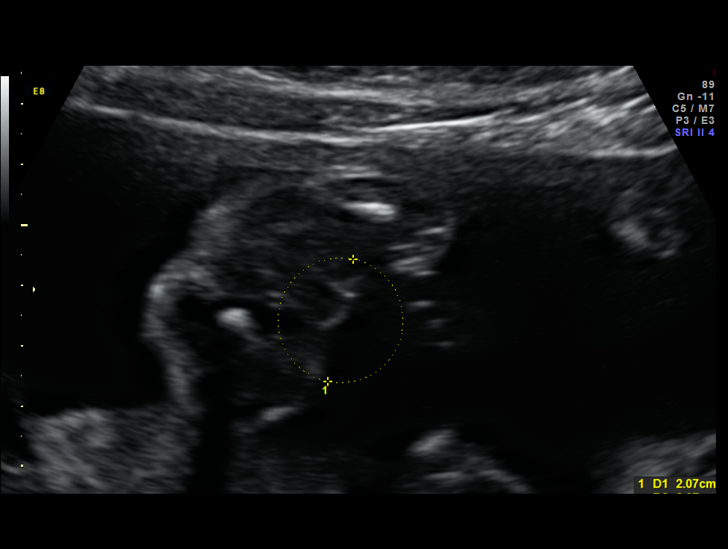
[im 65/93]
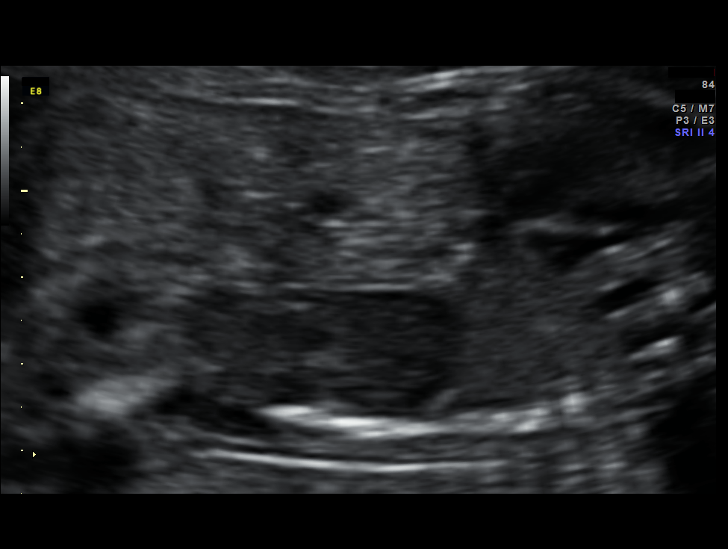
[im 72/93]
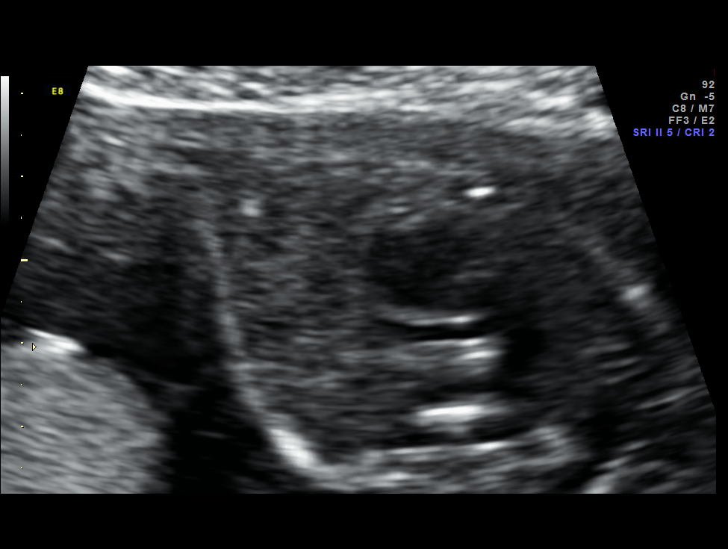
[im 79/93]
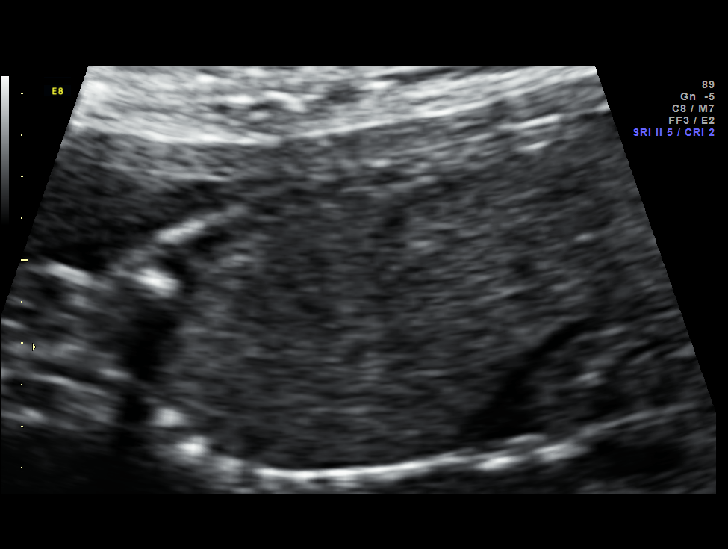
[im 86/93]
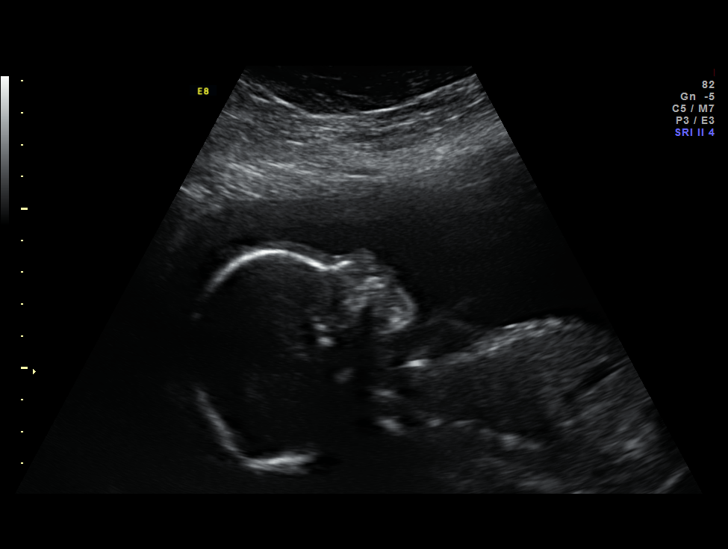
[im 93/93]
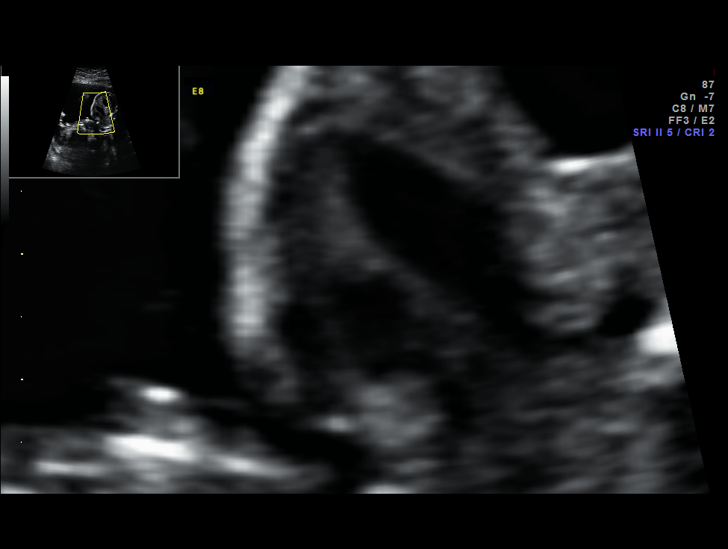

[14 of 28 positions shown; findings below may reference images not displayed]

Canned report from images found in remote index.

Refer to host system for actual result text.

## 2012-04-11 ENCOUNTER — Other Ambulatory Visit: Payer: Self-pay

## 2012-04-11 ENCOUNTER — Ambulatory Visit: Payer: 59 | Admitting: Cardiovascular Disease

## 2012-04-11 ENCOUNTER — Ambulatory Visit (HOSPITAL_COMMUNITY): Payer: 59 | Attending: Cardiovascular Disease | Admitting: Radiology

## 2012-04-11 DIAGNOSIS — I369 Nonrheumatic tricuspid valve disorder, unspecified: Secondary | ICD-10-CM | POA: Insufficient documentation

## 2012-04-11 DIAGNOSIS — I340 Nonrheumatic mitral (valve) insufficiency: Secondary | ICD-10-CM

## 2012-04-11 DIAGNOSIS — I509 Heart failure, unspecified: Secondary | ICD-10-CM | POA: Insufficient documentation

## 2012-04-11 DIAGNOSIS — I059 Rheumatic mitral valve disease, unspecified: Secondary | ICD-10-CM | POA: Insufficient documentation

## 2012-04-11 DIAGNOSIS — I501 Left ventricular failure: Secondary | ICD-10-CM

## 2012-04-11 NOTE — Progress Notes (Signed)
Echocardiogram performed.  

## 2012-04-15 ENCOUNTER — Ambulatory Visit (INDEPENDENT_AMBULATORY_CARE_PROVIDER_SITE_OTHER): Payer: 59 | Admitting: Cardiovascular Disease

## 2012-04-15 ENCOUNTER — Encounter: Payer: Self-pay | Admitting: Cardiovascular Disease

## 2012-04-15 VITALS — BP 152/98 | HR 60 | Ht 68.0 in | Wt 319.1 lb

## 2012-04-15 DIAGNOSIS — I10 Essential (primary) hypertension: Secondary | ICD-10-CM

## 2012-04-15 MED ORDER — LOSARTAN POTASSIUM-HCTZ 50-12.5 MG PO TABS: 1.0000 | ORAL_TABLET | Freq: Every day | ORAL | Status: DC

## 2012-04-15 NOTE — Progress Notes (Signed)
HPI:  31 year old woman presenting for followup evaluation. She was initially seen in April of this year when she developed postpartum acute pulmonary edema. This occurred 7 days following a C-section of a healthy girl. Her echocardiogram showed normal left ventricular systolic function. She was treated with a short course of diuretics and she recovered without further sequelae of congestive heart failure.  The patient is doing well at present. She denies chest pain, chest pressure, dyspnea, orthopnea, or PND. She does admit to leg swelling especially after she is up on her feet for a prolonged period. 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 She checks her blood pressure regularly and has noted that her blood pressure has been consistently elevated. She's been having some headaches which she associates with high blood pressure.  Outpatient Encounter Prescriptions as of 04/15/2012  Medication Sig Dispense Refill  . ALPRAZolam (XANAX) 0.5 MG tablet Take 0.5 mg by mouth at bedtime as needed.      Marland Kitchen HYDROcodone-acetaminophen (LORTAB) 7.5-500 MG per tablet Take 1 tablet by mouth every 6 (six) hours as needed.      Marland Kitchen ibuprofen (ADVIL,MOTRIN) 200 MG tablet Take 400 mg by mouth every 6 (six) hours as needed. For pain      . levothyroxine (SYNTHROID, LEVOTHROID) 100 MCG tablet Take 100 mcg by mouth daily.        . NON FORMULARY Season Al      . losartan-hydrochlorothiazide (HYZAAR) 50-12.5 MG per tablet Take 1 tablet by mouth daily.  30 tablet  11  . DISCONTD: Prenatal Vit-Fe Fumarate-FA (PRENATAL MULTIVITAMIN) TABS Take 1 tablet by mouth every morning.        Allergies  Allergen Reactions  . Other Swelling and Other (See Comments)    Antibiotic eye drops (pt not sure which kind) caused red swollen eye    Past Medical History  Diagnosis Date  . Hypothyroidism   . GERD (gastroesophageal reflux disease)   . Abnormal Pap smear   . Complication of anesthesia     Getsvery emotional  . History  of CHF (congestive heart failure)     post partum 09/2011 - echo 09/21/11: EF 60-65%, mod MR, mild LAE (due to post partum fluid shifts, HTN, obesity, ?MR)  . Moderate mitral regurgitation     needs echo 03/2012    ROS: Negative except as per HPI  BP 152/98  Pulse 60  Ht 5\' 8"  (1.727 m)  Wt 144.752 kg (319 lb 1.9 oz)  BMI 48.52 kg/m2  PHYSICAL EXAM: Pt is alert and oriented, pleasant, obese woman in NAD HEENT: normal Neck: JVP - normal, carotids 2+= without bruits Lungs: CTA bilaterally CV: RRR without murmur or gallop Abd: soft, NT, Positive BS, no hepatomegaly Ext: no C/C/E, distal pulses intact and equal Skin: warm/dry no rash  EKG:  Normal sinus rhythm 60 beats per minute, within normal limits.  2-D echo: Study Conclusions  - Left ventricle: Wall thickness was increased in a pattern of mild LVH. Systolic function was normal. The estimated ejection fraction was in the range of 60% to 65%. - Mitral valve: Mild regurgitation. - Left atrium: The atrium was mildly dilated.  ASSESSMENT AND PLAN: 1. Essential hypertension. The patient's blood pressure is elevated and she should begin antihypertensive therapy. We discussed potential agents and have decided that the best approach will be a combination of losartan/hydrochlorothiazide at a dose of 50/12.5 mg daily. Will followup with a metabolic panel in 2 weeks. The patient is not breast-feeding.  2. Postpartum pulmonary edema. We discussed risks of future pregnancy. Certainly recurrent pulmonary edema and heart failure are a concern. However, the patient's left ventricular function has been preserved throughout. As long as her blood pressure and volume status her closely monitored, I suspect the cardiac risk of future pregnancy is not prohibitive.  Tonny Bollman 04/15/2012 9:11 PM

## 2012-04-15 NOTE — Patient Instructions (Addendum)
Your physician has recommended you make the following change in your medication: START Losartan HCT 50/12.5 mg take one by mouth daily  Your physician recommends that you return for lab work in: 2 WEEKS, BMP (04/30/12)  Your physician has requested that you regularly monitor and record your blood pressure readings at home. Please use the same machine at the same time of day to check your readings and record them to bring to your follow-up visit.  Your physician wants you to follow-up in: 1 YEAR.  You will receive a reminder letter in the mail two months in advance. If you don't receive a letter, please call our office to schedule the follow-up appointment.

## 2012-04-30 ENCOUNTER — Other Ambulatory Visit (INDEPENDENT_AMBULATORY_CARE_PROVIDER_SITE_OTHER): Payer: 59

## 2012-04-30 DIAGNOSIS — I10 Essential (primary) hypertension: Secondary | ICD-10-CM

## 2012-04-30 LAB — BASIC METABOLIC PANEL
BUN: 20 mg/dL (ref 6–23)
Calcium: 8.8 mg/dL (ref 8.4–10.5)
GFR: 88.72 mL/min (ref >60.00)
Glucose, Bld: 77 mg/dL (ref 70–99)
Potassium: 4.3 mEq/L (ref 3.5–5.1)
Sodium: 137 mEq/L (ref 135–145)

## 2012-05-02 ENCOUNTER — Other Ambulatory Visit: Payer: Self-pay

## 2012-05-02 DIAGNOSIS — I10 Essential (primary) hypertension: Secondary | ICD-10-CM

## 2012-05-02 MED ORDER — LOSARTAN POTASSIUM-HCTZ 100-25 MG PO TABS: 1.0000 | ORAL_TABLET | Freq: Every day | ORAL | Status: DC

## 2012-05-07 IMAGING — US US OB FOLLOW-UP
2 series · 12 of 28 positions shown · non-contrast
Comparison: none

[Series 1: us ob follow up · 11 of 40 slices shown (1 of 2)]
[im 2/40]
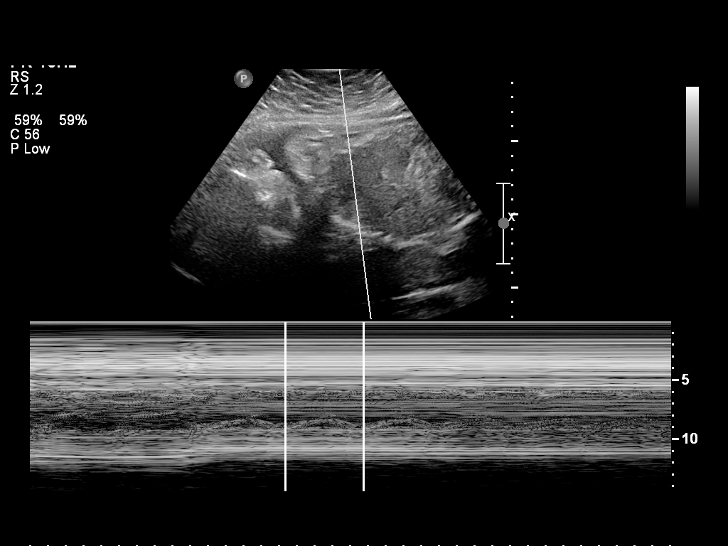
[im 5/40]
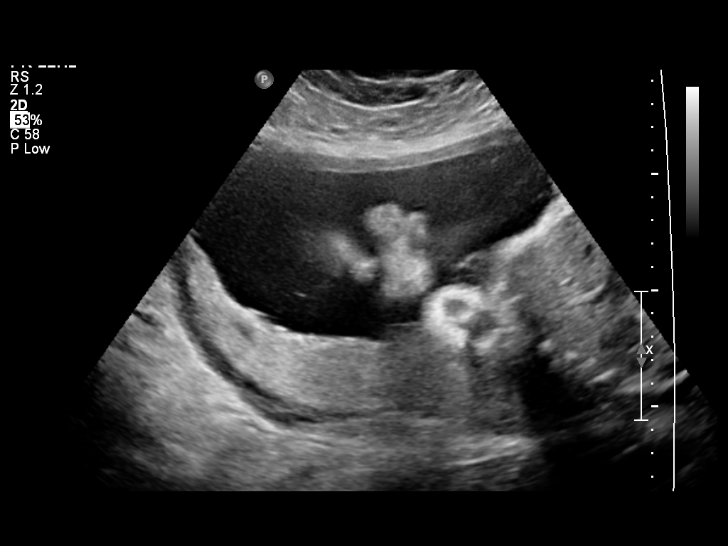
[im 9/40]
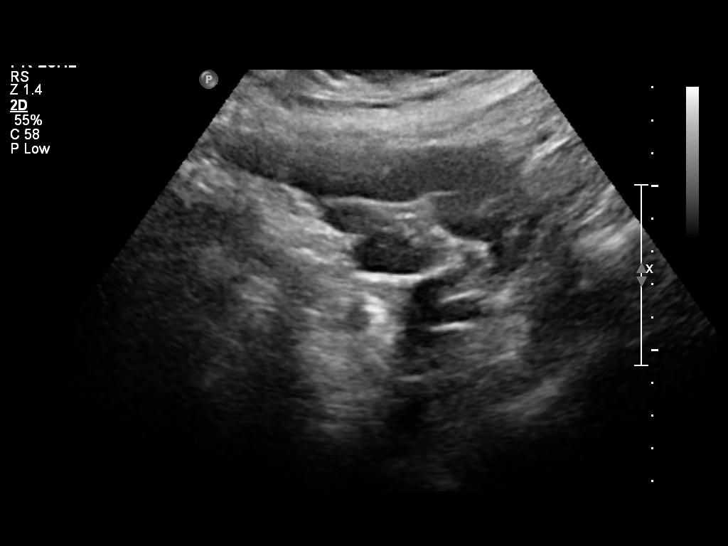
[im 14/40]
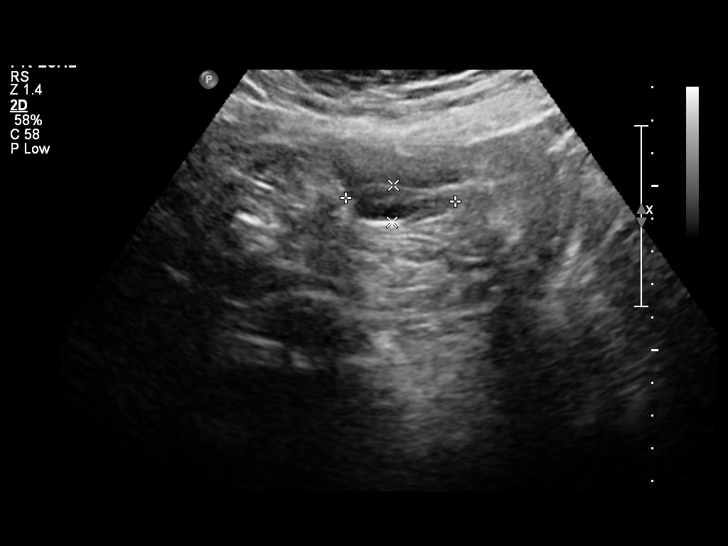
[im 17/40]
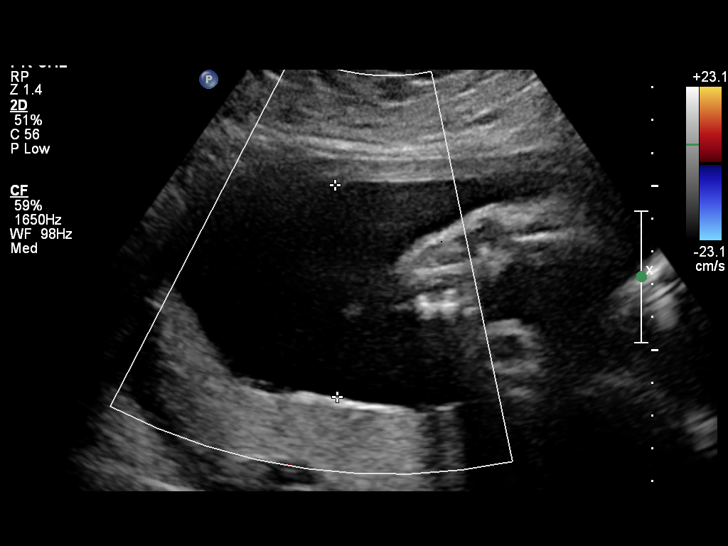
[im 20/40]
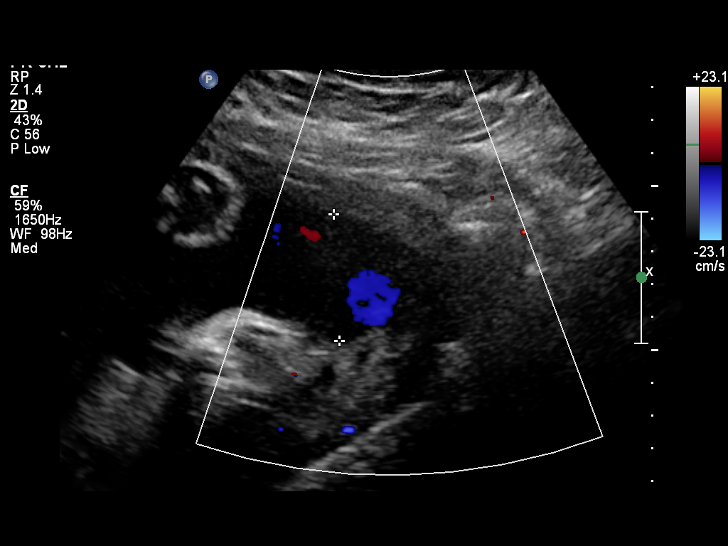
[im 25/40]
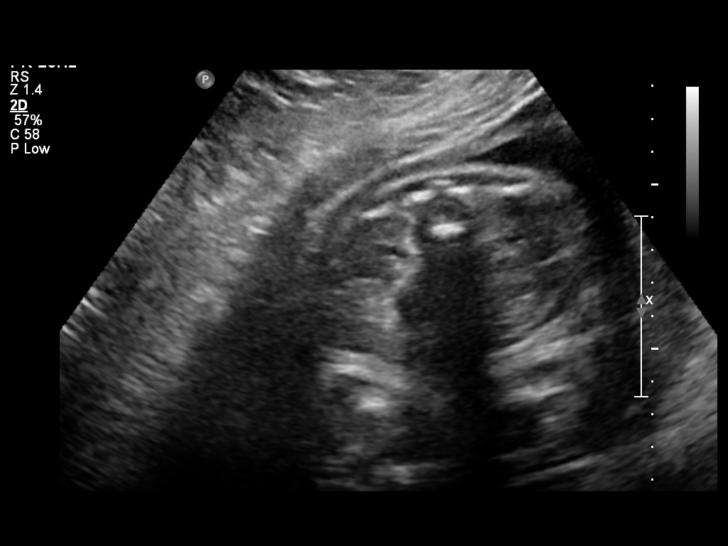
[im 28/40]
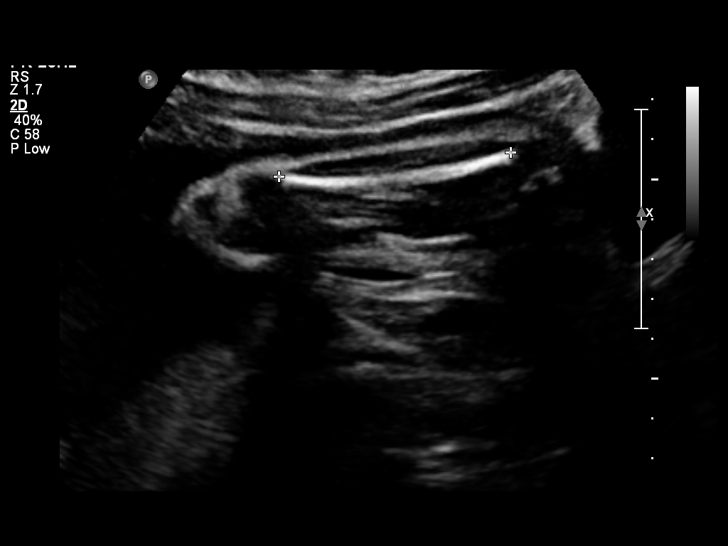
[im 31/40]
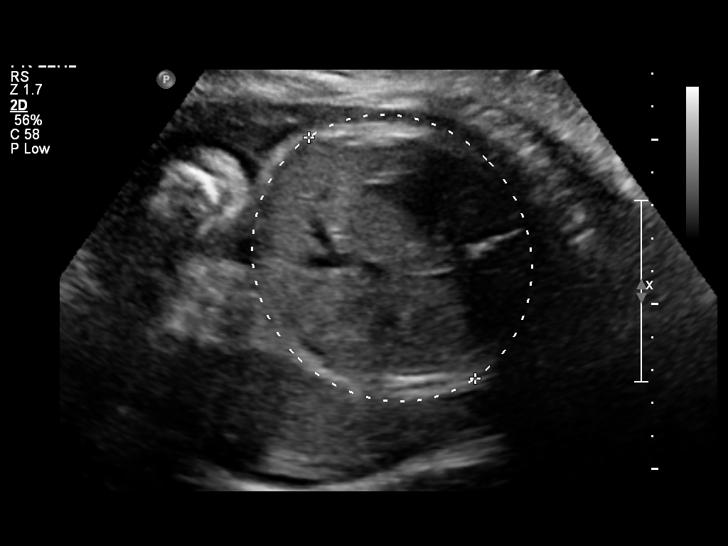
[im 36/40]
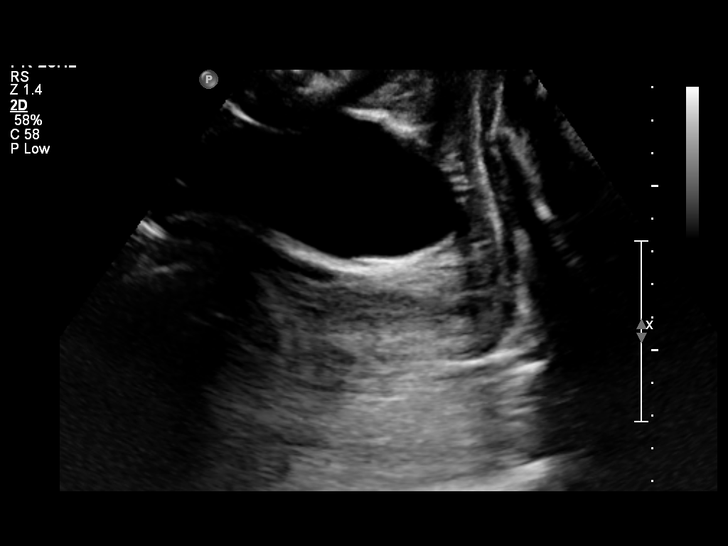
[im 40/40]
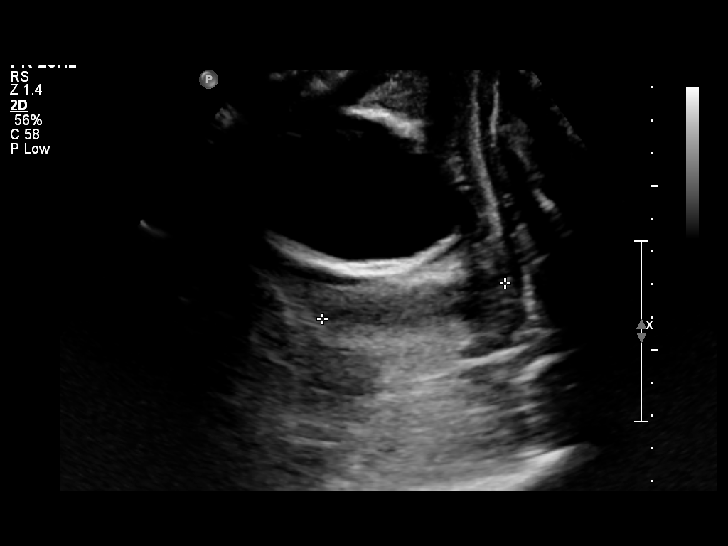

[Series 1: us ob follow up · 1 of 5 slices shown (2 of 2)]
[im 3/5]
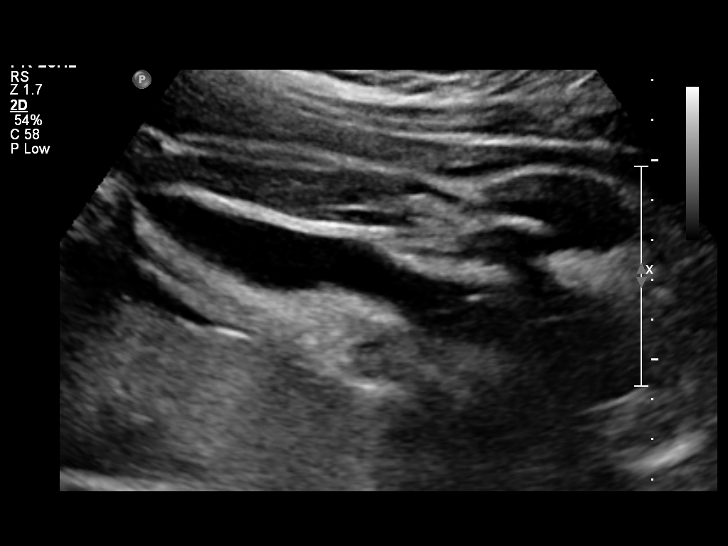

[12 of 28 positions shown; findings below may reference images not displayed]

OBSTETRICS REPORT
                      (Signed Final 07/05/2011 [DATE])

 Order#:         [id number]
Procedures

 US OB FOLLOW UP                                       76816.1
Indications

 Obesity
 Hypothyroid
 Assess Fetal Growth / Estimated Fetal Weight
 RH Negative
 Size greater than dates (Large for gestational [AGE]
Fetal Evaluation

 Fetal Heart Rate:  136                         bpm
 Cardiac Activity:  Observed
 Presentation:      Frank breech
 Placenta:          Posterior, above cervical
                    os
 P. Cord            Previously Visualized
 Insertion:

 Amniotic Fluid
 AFI FV:      Subjectively within normal limits
 AFI Sum:     19.65   cm      75   %Tile     Larg Pckt:   6.43   cm
 RUQ:   6.43   cm    RLQ:    5.83   cm    LUQ:   3.54    cm   LLQ:    3.85   cm
Biometry

 BPD:       77  mm    G. Age:   30w 6d                CI:        67.65   70 - 86
                                                      FL/HC:      19.4   19.3 -

 HC:     299.6  mm    G. Age:   33w 2d       78  %    HC/AC:      1.10   0.96 -

 AC:     272.5  mm    G. Age:   31w 2d       60  %    FL/BPD:     75.6   71 - 87
 FL:      58.2  mm    G. Age:   30w 3d       24  %    FL/AC:      21.4   20 - 24

 Est. FW:    3134  gm    3 lb 13 oz      61  %
Gestational Age

 LMP:           30w 6d       Date:   12/01/10                 EDD:   09/07/11
 U/S Today:     31w 3d                                        EDD:   09/03/11
 Best:          30w 6d    Det. By:   LMP  (12/01/10)          EDD:   09/07/11
Anatomy

 Cranium:           Appears normal      Aortic Arch:       Previously seen
 Fetal Cavum:       Previously seen     Ductal Arch:       Previously seen
 Ventricles:        Appears normal      Diaphragm:         Appears normal
 Choroid Plexus:    Previously seen     Stomach:           Appears
                                                           normal, left
                                                           sided
 Cerebellum:        Previously seen     Abdomen:           Previously seen
 Posterior Fossa:   Previously seen     Abdominal Wall:    Previously seen
 Nuchal Fold:       Previously seen     Cord Vessels:      Previously seen
 Face:              Previously seen     Kidneys:           Appear normal
 Heart:             Not well            Bladder:           Appears normal
                    visualized
                    today. Seen
                    previously.
 RVOT:              Previously seen     Spine:             Previously seen
 LVOT:              Previously seen     Limbs:             Previously seen

 Other:     Female gender previously seen. Heels and 5th digit
            previously seen.
Cervix Uterus Adnexa

 Cervical Length:   5.2       cm

 Cervix:       Measured translabially. Appears closed, without
               funnelling or dynamic change.

 Left Ovary:   Within normal limits.
 Right Ovary:  Within normal limits.
 Adnexa:     No abnormality visualized.
Impression

 Single living intrauterine fetus in Frank breech presentation.
 Assigned GA is currently 30w 6d.   Appropriate fetal growth,
 with EFW at 61 %ile.
 No late developing fetal abnormalities seen involving
 visualized anatomy.
 Amniotic fluid within normal limits, with AFI of 19.65 cm.
 Normal cervical length.

## 2013-04-19 LAB — HM PAP SMEAR

## 2013-05-30 ENCOUNTER — Other Ambulatory Visit: Payer: Self-pay | Admitting: *Deleted

## 2013-05-30 DIAGNOSIS — I10 Essential (primary) hypertension: Secondary | ICD-10-CM

## 2013-05-30 MED ORDER — LOSARTAN POTASSIUM-HCTZ 100-25 MG PO TABS: 1.0000 | ORAL_TABLET | Freq: Every day | ORAL | Status: DC

## 2013-07-15 ENCOUNTER — Ambulatory Visit: Payer: 59 | Admitting: Cardiovascular Disease

## 2013-07-25 ENCOUNTER — Ambulatory Visit (INDEPENDENT_AMBULATORY_CARE_PROVIDER_SITE_OTHER): Payer: 59 | Admitting: Cardiovascular Disease

## 2013-07-25 ENCOUNTER — Encounter: Payer: Self-pay | Admitting: Cardiovascular Disease

## 2013-07-25 VITALS — BP 108/84 | HR 96 | Ht 68.0 in | Wt 300.0 lb

## 2013-07-25 DIAGNOSIS — I501 Left ventricular failure: Secondary | ICD-10-CM

## 2013-07-25 DIAGNOSIS — I059 Rheumatic mitral valve disease, unspecified: Secondary | ICD-10-CM

## 2013-07-25 DIAGNOSIS — I509 Heart failure, unspecified: Secondary | ICD-10-CM

## 2013-07-25 DIAGNOSIS — I340 Nonrheumatic mitral (valve) insufficiency: Secondary | ICD-10-CM

## 2013-07-25 NOTE — Patient Instructions (Signed)
Your physician recommends that you schedule a follow-up appointment as needed with Dr Cooper.   Your physician recommends that you continue on your current medications as directed. Please refer to the Current Medication list given to you today.  

## 2013-07-25 NOTE — Progress Notes (Signed)
HPI:  33 year old woman presenting for followup evaluation. She has a history of postpartum pulmonary edema that occurred a few years ago after a C-section. She was noted to have normal left ventricular function. Her pregnancy was notable for hypertension but otherwise was uncomplicated.  The patient is doing fine from a cardiac perspective. However, she's had a very traumatic year. 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 She ultimately died in her heart was used for a cardiac transplantation.  The patient denies chest pain or pressure, dyspnea, edema, or palpitations. Her daughter is now approaching 51 years old. She is not planning on having more children in the next year.  Outpatient Encounter Prescriptions as of 07/25/2013  Medication Sig  . ALPRAZolam (XANAX) 0.5 MG tablet Take 0.5 mg by mouth at bedtime as needed.  . HYDROcodone-acetaminophen (NORCO) 7.5-325 MG per tablet Take 1 tablet by mouth every 6 (six) hours as needed for moderate pain.  . ibuprofen (ADVIL,MOTRIN) 200 MG tablet Take 400 mg by mouth every 6 (six) hours as needed. For pain  . levothyroxine (SYNTHROID, LEVOTHROID) 100 MCG tablet Take 100 mcg by mouth daily.    . NON FORMULARY Seasonale Birth Control  . losartan-hydrochlorothiazide (HYZAAR) 100-25 MG per tablet Take 1 tablet by mouth daily.  . [DISCONTINUED] HYDROcodone-acetaminophen (LORTAB) 7.5-500 MG per tablet Take 1 tablet by mouth every 6 (six) hours as needed.    Allergies  Allergen Reactions  . Other Swelling and Other (See Comments)    Antibiotic eye drops (pt not sure which kind) caused red swollen eye    Past Medical History  Diagnosis Date  . Hypothyroidism   . GERD (gastroesophageal reflux disease)   . Abnormal Pap smear   . Complication of anesthesia     Getsvery emotional  . History of CHF (congestive heart failure)     post partum 09/2011 - echo 09/21/11: EF  60-65%, mod MR, mild LAE (due to post partum fluid shifts, HTN, obesity, ?MR)  . Moderate mitral regurgitation     needs echo 03/2012    ROS: Negative except as per HPI  BP 108/84  Pulse 96  Ht 5\' 8"  (1.727 m)  Wt 300 lb (136.079 kg)  BMI 45.63 kg/m2  PHYSICAL EXAM: Pt is alert and oriented, NAD HEENT: normal Neck: JVP - normal, carotids 2+= without bruits Lungs: CTA bilaterally CV: RRR without murmur or gallop Abd: soft, NT, Positive BS, no hepatomegaly Ext: no C/C/E, distal pulses intact and equal Skin: warm/dry no rash  EKG:  Normal sinus rhythm 96 beats, low-voltage QRS, within normal limits.  2D Echo 04/11/2012: Left ventricle: Wall thickness was increased in a pattern of mild LVH. Systolic function was normal. The estimated ejection fraction was in the range of 60% to 65%.  ------------------------------------------------------------ Aortic valve: Structurally normal valve. Cusp separation was normal. Doppler: Transvalvular velocity was within the normal range. There was no stenosis. No regurgitation.  ------------------------------------------------------------ Aorta: Ascending aorta: The ascending aorta was mildly dilated.  ------------------------------------------------------------ Mitral valve: Structurally normal valve. Leaflet separation was normal. Doppler: Transvalvular velocity was within the normal range. There was no evidence for stenosis. Mild regurgitation. Peak gradient: [id number] Dills--------------------------------------------------------- Left atrium: The atrium was mildly dilated.  ------------------------------------------------------------ Right ventricle: The cavity size was normal. Wall thickness was normal. Systolic function was normal.  ------------------------------------------------------------ Pulmonic valve: Doppler: No significant regurgitation.  ------------------------------------------------------------ Tricuspid valve:  Doppler: Trivial regurgitation.  ------------------------------------------------------------  Pulmonary artery: Systolic pressure was within the normal range.  ------------------------------------------------------------ Right atrium: The atrium was normal in size.  ------------------------------------------------------------ Pericardium: There was no pericardial effusion.  ASSESSMENT AND PLAN: 1. Hypertension. Blood pressure is controlled. She continues on losartan hydrochlorothiazide.  2. History of acute pulmonary edema, postpartum. She never had LV dysfunction. Her echocardiogram at followup showed normal LV systolic function with mild LVH. I think there would be some risk of recurrent problems with future pregnancies, but this likely could be managed safely with a close eye on her blood pressure control and volume status.  For followup I could see her back as needed. She follows with Dr. Lovell Sheehan regularly who can manage her blood pressure. I would be happy to see her anytime if problems arise and certainly if pregnancy were to occur.  Tonny Bollman 07/25/2013 2:01 PM

## 2013-07-29 ENCOUNTER — Other Ambulatory Visit: Payer: Self-pay | Admitting: Cardiovascular Disease

## 2013-10-02 ENCOUNTER — Telehealth: Payer: Self-pay

## 2013-10-02 NOTE — Telephone Encounter (Signed)
Left message for call back Non-identifiable   NEW PATIENT 

## 2013-10-02 NOTE — Telephone Encounter (Addendum)
Medication and allergies:  Reviewed and updated  90 day supply/mail order: n/a Local pharmacy:  [id number] - [place], [place] - [address]   Immunizations due:  UTD   A/P: Personal, family history and past surgical hx: Reviewed and Updated PAP- Dr. Waynard Reeds- OB-GYN; last pap smear Nov. 2014 Flu- 03/2013 Tdap- less than 10 years ago.    To Discuss with Provider: Nothing at this time.

## 2013-10-03 ENCOUNTER — Encounter: Payer: Self-pay | Admitting: General Practice

## 2013-10-03 ENCOUNTER — Ambulatory Visit (INDEPENDENT_AMBULATORY_CARE_PROVIDER_SITE_OTHER): Payer: 59 | Admitting: Family Medicine

## 2013-10-03 ENCOUNTER — Encounter: Payer: Self-pay | Admitting: Family Medicine

## 2013-10-03 VITALS — BP 132/84 | HR 88 | Temp 98.6°F | Resp 16 | Ht 67.0 in | Wt 296.4 lb

## 2013-10-03 DIAGNOSIS — M519 Unspecified thoracic, thoracolumbar and lumbosacral intervertebral disc disorder: Secondary | ICD-10-CM

## 2013-10-03 DIAGNOSIS — I501 Left ventricular failure: Secondary | ICD-10-CM

## 2013-10-03 DIAGNOSIS — E039 Hypothyroidism, unspecified: Secondary | ICD-10-CM | POA: Insufficient documentation

## 2013-10-03 DIAGNOSIS — I10 Essential (primary) hypertension: Secondary | ICD-10-CM | POA: Insufficient documentation

## 2013-10-03 DIAGNOSIS — I509 Heart failure, unspecified: Secondary | ICD-10-CM

## 2013-10-03 DIAGNOSIS — F4310 Post-traumatic stress disorder, unspecified: Secondary | ICD-10-CM

## 2013-10-03 LAB — CBC WITH DIFFERENTIAL/PLATELET
BASOS PCT: 0.3 % (ref 0.0–3.0)
Basophils Absolute: 0 10*3/uL (ref 0.0–0.1)
EOS PCT: 0.7 % (ref 0.0–5.0)
Eosinophils Absolute: 0 10*3/uL (ref 0.0–0.7)
HCT: 37 % (ref 36.0–46.0)
Hemoglobin: 12.6 g/dL (ref 12.0–15.0)
Lymphocytes Relative: 29.7 % (ref 12.0–46.0)
Lymphs Abs: 1.8 10*3/uL (ref 0.7–4.0)
MCHC: 34 g/dL (ref 30.0–36.0)
MCV: 91.7 fl (ref 78.0–100.0)
MONOS PCT: 9 % (ref 3.0–12.0)
Monocytes Absolute: 0.5 10*3/uL (ref 0.1–1.0)
NEUTROS PCT: 60.3 % (ref 43.0–77.0)
Neutro Abs: 3.6 10*3/uL (ref 1.4–7.7)
PLATELETS: 277 10*3/uL (ref 150.0–400.0)
RBC: 4.03 Mil/uL (ref 3.87–5.11)
RDW: 13.5 % (ref 11.5–14.6)
WBC: 5.9 10*3/uL (ref 4.5–10.5)

## 2013-10-03 LAB — HEPATIC FUNCTION PANEL
ALBUMIN: 4 g/dL (ref 3.5–5.2)
ALT: 20 U/L (ref 0–35)
AST: 19 U/L (ref 0–37)
Alkaline Phosphatase: 36 U/L — ABNORMAL LOW (ref 39–117)
Bilirubin, Direct: 0.1 mg/dL (ref 0.0–0.3)
Total Bilirubin: 1.1 mg/dL (ref 0.3–1.2)
Total Protein: 7.9 g/dL (ref 6.0–8.3)

## 2013-10-03 LAB — LIPID PANEL
Cholesterol: 168 mg/dL (ref 0–200)
HDL: 41.7 mg/dL (ref >39.00)
LDL Cholesterol: 97 mg/dL (ref 0–99)
TRIGLYCERIDES: 146 mg/dL (ref 0.0–149.0)
Total CHOL/HDL Ratio: 4
VLDL: 29.2 mg/dL (ref 0.0–40.0)

## 2013-10-03 LAB — BASIC METABOLIC PANEL
BUN: 16 mg/dL (ref 6–23)
CO2: 27 meq/L (ref 19–32)
CREATININE: 1 mg/dL (ref 0.4–1.2)
Calcium: 9.3 mg/dL (ref 8.4–10.5)
Chloride: 102 mEq/L (ref 96–112)
GFR: 71.36 mL/min (ref >60.00)
Glucose, Bld: 83 mg/dL (ref 70–99)
Potassium: 3.6 mEq/L (ref 3.5–5.1)
Sodium: 139 mEq/L (ref 135–145)

## 2013-10-03 LAB — TSH: TSH: 2.24 u[IU]/mL (ref 0.35–5.50)

## 2013-10-03 MED ORDER — HYDROCODONE-ACETAMINOPHEN 7.5-325 MG PO TABS: 1.0000 | ORAL_TABLET | Freq: Four times a day (QID) | ORAL | Status: DC | PRN

## 2013-10-03 NOTE — Assessment & Plan Note (Signed)
New to provider, ongoing for pt.  Has never seen neurosurg and is currently maintained on hydrocodone.  Refer to neurosurg for complete evaluation.  Refill on hydrocodone provided.

## 2013-10-03 NOTE — Progress Notes (Signed)
Pre visit review using our clinic review tool, if applicable. No additional management support is needed unless otherwise documented below in the visit note. 

## 2013-10-03 NOTE — Assessment & Plan Note (Signed)
New to provider, ongoing for pt.  On synthroid.  Feels sxs are well controlled on current dose.  Check labs.  Adjust meds prn

## 2013-10-03 NOTE — Progress Notes (Signed)
   Subjective:    Patient ID: Suzanne Villanueva, female    DOB: Apr 07, 1981, 33 y.o.   MRN: [id number]  HPI New to establish.  Previous MD- Lovell Sheehan  Cards- Cooper  GYN- [facility]  CHF- during childbirth pt got 6 L of fluid and was initially dx'd as post-partum cardiomyopathy but Dr Excell Seltzer felt this was inaccurate.  BNP initially >1100, improved to normal w/in 10 days.  EF was never compromised.  Has been officially cleared by Cards.  Hypothyroid- chronic problem for pt, on Levothyroxine.  Due for labs.  Denies excessive fatigue, no changes to skin/hair/nails, constipation  HTN- developed post partum 2 yrs ago.  On Hyzaar.  Denies CP, SOB, HAs, visual changes, edema.  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  Plans on starting counseling.  Herniated Disc T spine- chronic problem, on Hydrocodone 'at least twice a day'.  Has never seen Neurosurgery.  Would prefer Dr Newell Coral.   Review of Systems For ROS see HPI     Objective:   Physical Exam  Vitals reviewed. Constitutional: She is oriented to person, place, and time. She appears well-developed and well-nourished. No distress.  obese  HENT:  Head: Normocephalic and atraumatic.  Eyes: Conjunctivae and EOM are normal. Pupils are equal, round, and reactive to light.  Neck: Normal range of motion. Neck supple. No thyromegaly present.  Cardiovascular: Normal rate, regular rhythm, normal heart sounds and intact distal pulses.   No murmur heard. Pulmonary/Chest: Effort normal and breath sounds normal. No respiratory distress.  Abdominal: Soft. She exhibits no distension. There is no tenderness.  Musculoskeletal: She exhibits no edema.  Lymphadenopathy:    She has no cervical adenopathy.  Neurological: She is alert and oriented to person, place, and time.  Skin: Skin is warm and dry.  Psychiatric: She has a normal mood and affect. Her behavior is normal.          Assessment & Plan:

## 2013-10-03 NOTE — Assessment & Plan Note (Signed)
Chronic problem.  Asymptomatic.  Adequate control.  Check labs.  No anticipated med changes. 

## 2013-10-03 NOTE — Assessment & Plan Note (Signed)
New.  Pt's mother coded in front of her and she was the one who initiated chest compressions.  Mom ultimately did not survive.  Taking xanax prn.  Planning to start counseling.  Will follow.

## 2013-10-03 NOTE — Patient Instructions (Signed)
Schedule your complete physical in 6 months We'll notify you of your lab results and make any changes if needed We'll call you with your Neurosurgery appt Call with any questions or concerns Welcome!  We're glad to have you!!!

## 2013-10-03 NOTE — Assessment & Plan Note (Signed)
New to provider, ongoing for pt.  Has been cleared by Cardiology and has normal LV function.

## 2013-10-06 ENCOUNTER — Telehealth: Payer: Self-pay | Admitting: Family Medicine

## 2013-10-06 NOTE — Telephone Encounter (Signed)
Relevant patient education assigned to patient using Emmi. ° °

## 2013-10-09 ENCOUNTER — Encounter: Payer: Self-pay | Admitting: Family Medicine

## 2013-10-10 MED ORDER — ESCITALOPRAM OXALATE 10 MG PO TABS: 10.0000 mg | ORAL_TABLET | Freq: Every day | ORAL | Status: DC

## 2013-10-10 NOTE — Telephone Encounter (Signed)
Med filled and pt notified.  

## 2013-10-27 ENCOUNTER — Telehealth: Payer: Self-pay | Admitting: Family Medicine

## 2013-10-27 ENCOUNTER — Encounter: Payer: Self-pay | Admitting: General Practice

## 2013-10-27 MED ORDER — HYDROCODONE-ACETAMINOPHEN 7.5-325 MG PO TABS: 1.0000 | ORAL_TABLET | Freq: Four times a day (QID) | ORAL | Status: DC | PRN

## 2013-10-27 NOTE — Telephone Encounter (Signed)
Ok for #90, PLEASE check on Neurosurg referral b/c my plan is not to treat her pain long term

## 2013-10-27 NOTE — Telephone Encounter (Signed)
Spoke w/[facility]. Patient had appointment on 10/15/13 with Dr. Newell Coral and had MRI done on 10/23/13. I have requested that they fax [place] the office notes and MRI results.

## 2013-10-27 NOTE — Telephone Encounter (Signed)
Last OV 10-03-13 Hydrocodone filled same day #90 with 0  No [facility]SC on file

## 2013-10-27 NOTE — Telephone Encounter (Signed)
This will be last pain med refill as pt has now established w/ them and then can either assume her treatment or she can go to pain management.

## 2013-10-27 NOTE — Telephone Encounter (Signed)
Noted and pt notified

## 2013-10-27 NOTE — Telephone Encounter (Signed)
Noted! Thank you

## 2013-10-27 NOTE — Telephone Encounter (Signed)
Caller name:Yaira Mccleave Relation to PP:[id number] Call back number:[phone] Pharmacy:  Reason for call: to request a refill for hydrocodone

## 2013-10-27 NOTE — Telephone Encounter (Signed)
Med filled and pt notified. Message sent to [place] to advise due to it looking as though pt was scheduled with Neurosurgery for 10/16/13.

## 2013-12-11 ENCOUNTER — Encounter: Payer: Self-pay | Admitting: Family Medicine

## 2013-12-12 ENCOUNTER — Other Ambulatory Visit: Payer: Self-pay | Admitting: Family Medicine

## 2013-12-12 DIAGNOSIS — M519 Unspecified thoracic, thoracolumbar and lumbosacral intervertebral disc disorder: Secondary | ICD-10-CM

## 2014-02-27 ENCOUNTER — Encounter (HOSPITAL_COMMUNITY): Payer: Self-pay | Admitting: Emergency Medicine

## 2014-02-27 ENCOUNTER — Emergency Department (HOSPITAL_COMMUNITY): Payer: 59

## 2014-02-27 ENCOUNTER — Emergency Department (HOSPITAL_COMMUNITY)
Admission: EM | Admit: 2014-02-27 | Discharge: 2014-02-27 | Disposition: A | Discharge status: Home / Self Care | Payer: 59 | Attending: Emergency Medicine | Admitting: Emergency Medicine

## 2014-02-27 DIAGNOSIS — Z872 Personal history of diseases of the skin and subcutaneous tissue: Secondary | ICD-10-CM | POA: Insufficient documentation

## 2014-02-27 DIAGNOSIS — I10 Essential (primary) hypertension: Secondary | ICD-10-CM | POA: Diagnosis not present

## 2014-02-27 DIAGNOSIS — Z3202 Encounter for pregnancy test, result negative: Secondary | ICD-10-CM | POA: Insufficient documentation

## 2014-02-27 DIAGNOSIS — I509 Heart failure, unspecified: Secondary | ICD-10-CM | POA: Diagnosis not present

## 2014-02-27 DIAGNOSIS — R1011 Right upper quadrant pain: Secondary | ICD-10-CM | POA: Insufficient documentation

## 2014-02-27 DIAGNOSIS — Z8614 Personal history of Methicillin resistant Staphylococcus aureus infection: Secondary | ICD-10-CM | POA: Insufficient documentation

## 2014-02-27 DIAGNOSIS — R110 Nausea: Secondary | ICD-10-CM | POA: Diagnosis not present

## 2014-02-27 DIAGNOSIS — Z79899 Other long term (current) drug therapy: Secondary | ICD-10-CM | POA: Insufficient documentation

## 2014-02-27 DIAGNOSIS — Z8719 Personal history of other diseases of the digestive system: Secondary | ICD-10-CM | POA: Insufficient documentation

## 2014-02-27 DIAGNOSIS — R197 Diarrhea, unspecified: Secondary | ICD-10-CM | POA: Diagnosis not present

## 2014-02-27 DIAGNOSIS — Z791 Long term (current) use of non-steroidal anti-inflammatories (NSAID): Secondary | ICD-10-CM | POA: Diagnosis not present

## 2014-02-27 DIAGNOSIS — R011 Cardiac murmur, unspecified: Secondary | ICD-10-CM | POA: Insufficient documentation

## 2014-02-27 DIAGNOSIS — R630 Anorexia: Secondary | ICD-10-CM | POA: Diagnosis not present

## 2014-02-27 DIAGNOSIS — E039 Hypothyroidism, unspecified: Secondary | ICD-10-CM | POA: Diagnosis not present

## 2014-02-27 DIAGNOSIS — F3289 Other specified depressive episodes: Secondary | ICD-10-CM | POA: Diagnosis not present

## 2014-02-27 DIAGNOSIS — F329 Major depressive disorder, single episode, unspecified: Secondary | ICD-10-CM | POA: Diagnosis not present

## 2014-02-27 DIAGNOSIS — Z8619 Personal history of other infectious and parasitic diseases: Secondary | ICD-10-CM | POA: Diagnosis not present

## 2014-02-27 DIAGNOSIS — Z87440 Personal history of urinary (tract) infections: Secondary | ICD-10-CM | POA: Diagnosis not present

## 2014-02-27 DIAGNOSIS — Z87891 Personal history of nicotine dependence: Secondary | ICD-10-CM | POA: Diagnosis not present

## 2014-02-27 LAB — CBC WITH DIFFERENTIAL/PLATELET
Basophils Absolute: 0 10*3/uL (ref 0.0–0.1)
Basophils Relative: 0 % (ref 0–1)
Eosinophils Absolute: 0 10*3/uL (ref 0.0–0.7)
Eosinophils Relative: 1 % (ref 0–5)
HCT: 32.8 % — ABNORMAL LOW (ref 36.0–46.0)
Hemoglobin: 10.8 g/dL — ABNORMAL LOW (ref 12.0–15.0)
Lymphocytes Relative: 33 % (ref 12–46)
Lymphs Abs: 1.9 10*3/uL (ref 0.7–4.0)
MCH: 30.1 pg (ref 26.0–34.0)
MCHC: 32.9 g/dL (ref 30.0–36.0)
MCV: 91.4 fL (ref 78.0–100.0)
Monocytes Absolute: 0.5 10*3/uL (ref 0.1–1.0)
Monocytes Relative: 8 % (ref 3–12)
Neutro Abs: 3.3 10*3/uL (ref 1.7–7.7)
Neutrophils Relative %: 58 % (ref 43–77)
Platelets: 269 10*3/uL (ref 150–400)
RBC: 3.59 MIL/uL — ABNORMAL LOW (ref 3.87–5.11)
RDW: 12.5 % (ref 11.5–15.5)
WBC: 5.7 10*3/uL (ref 4.0–10.5)

## 2014-02-27 LAB — URINALYSIS, ROUTINE W REFLEX MICROSCOPIC
Bilirubin Urine: NEGATIVE
Glucose, UA: NEGATIVE mg/dL
Hgb urine dipstick: NEGATIVE
Ketones, ur: NEGATIVE mg/dL
Leukocytes, UA: NEGATIVE
Nitrite: NEGATIVE
Protein, ur: NEGATIVE mg/dL
Specific Gravity, Urine: 1.025 (ref 1.005–1.030)
Urobilinogen, UA: 0.2 mg/dL (ref 0.0–1.0)
pH: 6 (ref 5.0–8.0)

## 2014-02-27 LAB — COMPREHENSIVE METABOLIC PANEL
ALT: 18 U/L (ref 0–35)
AST: 16 U/L (ref 0–37)
Albumin: 3.5 g/dL (ref 3.5–5.2)
Alkaline Phosphatase: 44 U/L (ref 39–117)
Anion gap: 11 (ref 5–15)
BUN: 14 mg/dL (ref 6–23)
CO2: 25 mEq/L (ref 19–32)
Calcium: 8.9 mg/dL (ref 8.4–10.5)
Chloride: 101 mEq/L (ref 96–112)
Creatinine, Ser: 0.81 mg/dL (ref 0.50–1.10)
GFR calc Af Amer: >90 mL/min (ref >90)
GFR calc non Af Amer: >90 mL/min (ref >90)
Glucose, Bld: 91 mg/dL (ref 70–99)
Potassium: 3.7 mEq/L (ref 3.7–5.3)
Sodium: 137 mEq/L (ref 137–147)
Total Bilirubin: 0.6 mg/dL (ref 0.3–1.2)
Total Protein: 7 g/dL (ref 6.0–8.3)

## 2014-02-27 LAB — PREGNANCY, URINE: Preg Test, Ur: NEGATIVE

## 2014-02-27 LAB — LIPASE, BLOOD: Lipase: 33 U/L (ref 11–59)

## 2014-02-27 MED ORDER — IOHEXOL 300 MG/ML  SOLN
25.0000 mL | Freq: Once | INTRAMUSCULAR | Status: AC | PRN
  Administered 2014-02-27: 25 mL via ORAL

## 2014-02-27 MED ORDER — OXYCODONE-ACETAMINOPHEN 5-325 MG PO TABS
1.0000 | ORAL_TABLET | ORAL | Status: DC | PRN
Start: 2014-02-27 — End: 2014-07-29

## 2014-02-27 MED ORDER — ONDANSETRON HCL 4 MG/2ML IJ SOLN
4.0000 mg | Freq: Once | INTRAMUSCULAR | Status: AC
  Administered 2014-02-27: 4 mg via INTRAVENOUS
  Filled 2014-02-27: qty 2

## 2014-02-27 MED ORDER — IOHEXOL 300 MG/ML  SOLN
100.0000 mL | Freq: Once | INTRAMUSCULAR | Status: AC | PRN
  Administered 2014-02-27: 100 mL via INTRAVENOUS

## 2014-02-27 MED ORDER — HYDROMORPHONE HCL PF 1 MG/ML IJ SOLN
1.0000 mg | Freq: Once | INTRAMUSCULAR | Status: AC
  Administered 2014-02-27: 1 mg via INTRAVENOUS
  Filled 2014-02-27: qty 1

## 2014-02-27 MED ORDER — HYDROMORPHONE HCL PF 1 MG/ML IJ SOLN
1.0000 mg | Freq: Once | INTRAMUSCULAR | Status: AC
Start: 2014-02-27 — End: 2014-02-27
  Administered 2014-02-27: 1 mg via INTRAVENOUS
  Filled 2014-02-27: qty 1

## 2014-02-27 NOTE — ED Notes (Signed)
Patient is alert and orientedx4.  Patient was explained discharge instructions and they understood them with no questions.  The patient's husband, Spirit Goodie is taking the patient home.

## 2014-02-27 NOTE — ED Notes (Signed)
Resident at bedside.  

## 2014-02-27 NOTE — ED Notes (Signed)
abd pain that goes thru to back eased off today came back w/ vengeance has had constipation and then took a lax now has diarrhea

## 2014-02-27 NOTE — Discharge Instructions (Signed)

## 2014-02-27 NOTE — ED Notes (Signed)
Pt was c/o pain at IV site, no redness or swelling noted, flushed well with positive blood return, IV removed due to patient pain

## 2014-02-27 NOTE — ED Provider Notes (Signed)
CSN: [id number]     Arrival date & time 02/27/14  1300 History   First MD Initiated Contact with Patient 02/27/14 1335     Chief Complaint  Patient presents with  . Abdominal Pain     (Consider location/radiation/quality/duration/timing/severity/associated sxs/prior Treatment) HPI Suzanne Villanueva is a 33 y.o. female who presents today for RUQ abdominal pain that started this past Saturday. Patient thought it was due to constipation so she took laxatives, a day later she was unable to stop having diarrhea so she took imodium. Last episode of diarrhea was last night. The pain never subsided and has gotten worse with it now radiating into the right shoulder blade. Notes loss of appetite and accompanied nausea with the pain. Patient was at work today with [employer] and when pain acutely worsened and she came to the ED. No history of abdominal surgery besides a c-section. Denies hematochezia, fever and vomiting.   Past Medical History  Diagnosis Date  . Hypothyroidism   . GERD (gastroesophageal reflux disease)   . Abnormal Pap smear   . Complication of anesthesia     Getsvery emotional  . History of CHF (congestive heart failure)     post partum 09/2011 - echo 09/21/11: EF 60-65%, mod MR, mild LAE (due to post partum fluid shifts, HTN, obesity, ?MR)  . Moderate mitral regurgitation     needs echo 03/2012  . Abscess     behind the right ear  . Chicken pox   . Depression   . Seasonal allergies   . Heart murmur   . Hypertension   . Migraines   . Urinary tract infection   . Scarlet fever x 2  . PTSD (post-traumatic stress disorder)   . MRSA (methicillin resistant staph aureus) culture positive    Past Surgical History  Procedure Laterality Date  . Tonsillectomy  1990  . Wisdom tooth extraction    . Myringotomy    . Cesarean section  09/14/2011    Procedure: CESAREAN SECTION;  Surgeon: Freddrick March. Tenny Craw, MD;  Location: [facility];  Service: Gynecology;  Laterality: N/A;  Primary  Cesarean Section with birth of baby girl @ 2302  . Tympanostomy tube placement      '90, '96, '12   Family History  Problem Relation Age of Onset  . Heart disease      parents  . Hypertension      parents  . Heart attack Father   . Arthritis      grandparents  . Diabetes      parents   History  Substance Use Topics  . Smoking status: Former Smoker -- 0.50 packs/day    Quit date: 01/14/2011  . Smokeless tobacco: Never Used  . Alcohol Use: No   OB History   Grav Para Term Preterm Abortions TAB SAB Ect Mult Living   1 1 1  0 0 0 0 0 0 1     Review of Systems  Constitutional: Positive for appetite change (Decrease in appetitie).  Gastrointestinal: Positive for nausea, abdominal pain (RUQ) and diarrhea.  All other systems reviewed and are negative.     Allergies  Other  Home Medications   Prior to Admission medications   Medication Sig Start Date End Date Taking? Authorizing Provider  ALPRAZolam Prudy Feeler) 0.5 MG tablet Take 0.5 mg by mouth at bedtime as needed.   Yes Historical Provider, MD  chlorhexidine (PERIDEX) 0.12 % solution 15 mLs by Mouth Rinse route as needed. 07/19/13  Yes Historical  Provider, MD  escitalopram (LEXAPRO) 10 MG tablet Take 1 tablet (10 mg total) by mouth daily. 10/10/13  Yes Sheliah Hatch, MD  HYDROcodone-acetaminophen (NORCO) 7.5-325 MG per tablet Take 1 tablet by mouth every 6 (six) hours as needed for moderate pain. 10/27/13  Yes Sheliah Hatch, MD  ibuprofen (ADVIL,MOTRIN) 200 MG tablet Take 400 mg by mouth every 6 (six) hours as needed. For pain   Yes Historical Provider, MD  levonorgestrel-ethinyl estradiol (SEASONALE,INTROVALE,JOLESSA) 0.15-0.03 MG tablet Take 1 tablet by mouth daily. 07/19/13  Yes Historical Provider, MD  levothyroxine (SYNTHROID, LEVOTHROID) 100 MCG tablet Take 100 mcg by mouth daily.     Yes Historical Provider, MD  losartan-hydrochlorothiazide (HYZAAR) 100-25 MG per tablet TAKE ONE TABLET BY MOUTH EVERY DAY   Yes  Tonny Bollman, MD  meloxicam (MOBIC) 15 MG tablet Take 1 tablet by mouth daily. 02/06/14  Yes Historical Provider, MD  phentermine 37.5 MG capsule Take 37.5 mg by mouth every morning.   Yes Historical Provider, MD   BP 140/78  Pulse 78  Temp(Src) 98.1 F (36.7 C) (Oral)  Resp 16  SpO2 100% Physical Exam  Constitutional: She is oriented to person, place, and time. She appears well-developed and well-nourished. No distress.  HENT:  Head: Normocephalic and atraumatic.  Eyes: Conjunctivae are normal. No scleral icterus.  Neck: Normal range of motion.  Cardiovascular: Normal rate, regular rhythm and normal heart sounds.  Exam reveals no gallop and no friction rub.   No murmur heard. Pulmonary/Chest: Effort normal and breath sounds normal. No respiratory distress.  Abdominal: Soft. Bowel sounds are normal. She exhibits no distension and no mass. There is tenderness in the right upper quadrant. There is positive Murphy's sign. There is no guarding.  Neurological: She is alert and oriented to person, place, and time.  Skin: Skin is warm and dry. She is not diaphoretic.    ED Course  Procedures (including critical care time) Labs Review Labs Reviewed  CBC WITH DIFFERENTIAL - Abnormal; Notable for the following:    RBC 3.59 (*)    Hemoglobin 10.8 (*)    HCT 32.8 (*)    All other components within normal limits  COMPREHENSIVE METABOLIC PANEL  LIPASE, BLOOD  URINALYSIS, ROUTINE W REFLEX MICROSCOPIC  PREGNANCY, URINE    Imaging Review No results found.   EKG Interpretation None      MDM   Final diagnoses:  None    3:06 PM BP 140/78  Pulse 78  Temp(Src) 98.1 F (36.7 C) (Oral)  Resp 16  SpO2 100% The patient is here with RUQ pain radiating tot he R Shoulder.  Highest on the differential is cholecystitis. The differential diagnosis for RUQ is, but not limited to:  Cholelithiasis, cholecystitis, hepatitis, eg, viral, alcoholic, toxic, cholangitis or choledocholithiasis,  peptic ulcer disease (duodenal), pancreatitis, functional or nonulcer dyspepsia, liver abscess, liver, pancreatic, or biliary tract cancer, ischemic hepatopathy (shock liver), hepatic vein obstruction (Budd-Chiari syndrome), right lower lobe pneumonia, pyelonephritis, urinary calculi,  Fitz-Hugh-Curtis syndrome (with pelvic inflammatory disease), liver cell adenoma, herpes zoster, trauma or musculoskeletal pain, herniated disk, abdominal abscess , intestinal ischemia, physical or sexual abuse, ectopic pregnancy, IUP, Mittelschmerz, appendicitis, cholecystitis, ovarian cyst/torsion, threatened/ievitable abortion, PID, endometriosis, molar pregnancy, UTI/renal colic, heterotopic pregnancy, IBD, corpus luteum cyst.  Labs are pending. I have ordered an abdominal US.     4:30  Patient labs are with out abnormality specifically no elevation in lever enzymes. I have given report to Dr. Edman Circle who will assume care  of the patient.   Arthor Captain, PA-C 02/28/14 [phone]

## 2014-02-27 NOTE — ED Provider Notes (Signed)
Assumed care from PA @1610 .  Pt with RUQ pain for 5-6 days. Now severe. Colicky. +nausea. Radiates to right shoulder.  Labs abnormal. + Murphys.  RUQ u/s ordered. Previous c/s  Labs Reviewed  CBC WITH DIFFERENTIAL - Abnormal; Notable for the following:    RBC 3.59 (*)    Hemoglobin 10.8 (*)    HCT 32.8 (*)    All other components within normal limits  COMPREHENSIVE METABOLIC PANEL  LIPASE, BLOOD  URINALYSIS, ROUTINE W REFLEX MICROSCOPIC  PREGNANCY, URINE    US ABDOMEN COMPLETE   Final Result:      CT ABDOMEN PELVIS W CONTRAST   Final Result:        PERC neg  1730: TTP in RUQ, no cw TTP. Abd soft, ND.  No peritonitis.    CT negative.   7:43 PM Pain moderate. Appears comfortable. Abd soft, minimal TTP to RUQ nowhere else.  Non toxic appearing  Pt agreeable for discharge to home. Will see PCP within week.  Return to ED for worsening of condition. Pt may need non emergent eval with HIDA scan, will discuss with PCP.  Sofie Rower, MD 02/28/14 [phone]

## 2014-02-28 NOTE — ED Provider Notes (Signed)
I have reviewed the documentation of the resident and agree.   Joya Gaskins, MD 02/28/14 2138

## 2014-02-28 NOTE — ED Provider Notes (Signed)
Medical screening examination/treatment/procedure(s) were performed by non-physician practitioner and as supervising physician I was immediately available for consultation/collaboration.   EKG Interpretation   Date/Time:  Friday February 27 2014 17:10:23 EDT Ventricular Rate:  64 PR Interval:  164 QRS Duration: 95 QT Interval:  412 QTC Calculation: 425 R Axis:   9 Text Interpretation:  Sinus rhythm Low voltage, precordial leads No  significant change since last tracing Confirmed by Bebe Shaggy  MD, DONALD  [phone]) on 02/27/2014 5:15:47 PM       Raeford Razor, MD 02/28/14 [phone]

## 2014-03-01 ENCOUNTER — Encounter: Payer: Self-pay | Admitting: Family Medicine

## 2014-03-02 ENCOUNTER — Encounter: Payer: Self-pay | Admitting: Family Medicine

## 2014-03-02 ENCOUNTER — Ambulatory Visit (INDEPENDENT_AMBULATORY_CARE_PROVIDER_SITE_OTHER): Payer: 59 | Admitting: Family Medicine

## 2014-03-02 VITALS — BP 142/80 | HR 81 | Temp 98.5°F | Resp 16 | Wt 289.2 lb

## 2014-03-02 DIAGNOSIS — R1011 Right upper quadrant pain: Secondary | ICD-10-CM | POA: Insufficient documentation

## 2014-03-02 NOTE — Progress Notes (Signed)
   Subjective:    Patient ID: Suzanne Villanueva, female    DOB: 10-Jul-1980, 33 y.o.   MRN: [id number]  HPI RUQ- sxs started 9 days ago.  Pt initially thought it was a 'constipation issue'.  Friday developed severe pain, was unable to work, 'doubled over and crying'.  Went to ER.  Intermittent nausea- worse w/ pain.  Pain is worse w/ eating.  Has eliminated 'all fatty and greasy foods'.  Pain radiates to R shoulder.  Had normal [place] and CT scan but was told that she needed a HIDA scan.  Labs WNL w/ exception of stable anemia.   Review of Systems For ROS see HPI     Objective:   Physical Exam  Vitals reviewed. Constitutional: She is oriented to person, place, and time. She appears well-developed and well-nourished.  Cardiovascular: Normal rate, normal heart sounds and intact distal pulses.   Pulmonary/Chest: Effort normal and breath sounds normal. No respiratory distress. She has no wheezes. She has no rales.  Abdominal: Soft. Bowel sounds are normal. She exhibits no distension. There is tenderness (RUQ tenderness). There is guarding (voluntary). There is no rebound.  Neurological: She is alert and oriented to person, place, and time.  Skin: Skin is warm and dry.  Psychiatric: She has a normal mood and affect. Her behavior is normal. Thought content normal.          Assessment & Plan:

## 2014-03-02 NOTE — Addendum Note (Signed)
Addended by: Sheliah Hatch on: 03/02/2014 10:09 AM   Modules accepted: Level of Service

## 2014-03-02 NOTE — Patient Instructions (Signed)
Follow up as needed We'll notify you of your HIDA scan appt and then refer you to surgery if needed Continue the pain meds as needed Call with any questions or concerns Hang in there!!

## 2014-03-02 NOTE — Assessment & Plan Note (Signed)
New.  Despite normal CT and [place], pt's sxs are consistent w/ biliary colic.  Will get HIDA scan and determine whether pt needs surgical referral.  Pt to continue pain meds available at home from pain clinic- she will call them to notify them of increased use due to acute situation.  Reviewed supportive care and red flags that should prompt return.  Pt expressed understanding and is in agreement w/ plan.

## 2014-03-02 NOTE — Progress Notes (Signed)
Pre visit review using our clinic review tool, if applicable. No additional management support is needed unless otherwise documented below in the visit note. 

## 2014-03-03 ENCOUNTER — Encounter (HOSPITAL_COMMUNITY)
Admission: RE | Admit: 2014-03-03 | Discharge: 2014-03-03 | Disposition: A | Discharge status: Home / Self Care | Payer: 59 | Source: Ambulatory Visit | Attending: Family Medicine | Admitting: Family Medicine

## 2014-03-03 DIAGNOSIS — R1011 Right upper quadrant pain: Secondary | ICD-10-CM | POA: Insufficient documentation

## 2014-03-03 MED ORDER — STERILE WATER FOR INJECTION IJ SOLN
INTRAMUSCULAR | Status: AC
  Administered 2014-03-03: 10 mL
  Filled 2014-03-03: qty 10

## 2014-03-03 MED ORDER — SINCALIDE 5 MCG IJ SOLR
INTRAMUSCULAR | Status: AC
  Administered 2014-03-03: 2.6 ug via INTRAVENOUS
  Filled 2014-03-03: qty 10

## 2014-03-03 MED ORDER — SINCALIDE 5 MCG IJ SOLR
0.0200 ug/kg | Freq: Once | INTRAMUSCULAR | Status: AC
  Administered 2014-03-03: 2.6 ug via INTRAVENOUS

## 2014-03-04 ENCOUNTER — Encounter: Payer: Self-pay | Admitting: Family Medicine

## 2014-03-04 ENCOUNTER — Other Ambulatory Visit: Payer: Self-pay | Admitting: Family Medicine

## 2014-03-04 DIAGNOSIS — R1011 Right upper quadrant pain: Secondary | ICD-10-CM

## 2014-04-03 ENCOUNTER — Other Ambulatory Visit: Payer: Self-pay | Admitting: General Practice

## 2014-04-03 MED ORDER — LEVOTHYROXINE SODIUM 100 MCG PO TABS: 100.0000 ug | ORAL_TABLET | Freq: Every day | ORAL | Status: DC

## 2014-04-08 ENCOUNTER — Ambulatory Visit (INDEPENDENT_AMBULATORY_CARE_PROVIDER_SITE_OTHER): Payer: 59 | Admitting: Family Medicine

## 2014-04-08 ENCOUNTER — Encounter: Payer: Self-pay | Admitting: Family Medicine

## 2014-04-08 VITALS — BP 128/84 | HR 84 | Temp 98.2°F | Resp 16 | Ht 66.75 in | Wt 293.0 lb

## 2014-04-08 DIAGNOSIS — Z0000 Encounter for general adult medical examination without abnormal findings: Secondary | ICD-10-CM | POA: Insufficient documentation

## 2014-04-08 LAB — VITAMIN D 25 HYDROXY (VIT D DEFICIENCY, FRACTURES): VITD: 18.03 ng/mL — ABNORMAL LOW (ref 30.00–100.00)

## 2014-04-08 NOTE — Patient Instructions (Signed)
Schedule a BP f/u in 6 months We'll notify you of your lab results and make any changes if needed Call with any questions or concerns Good luck on Monday! Happy Fall!!!

## 2014-04-08 NOTE — Progress Notes (Signed)
Pre visit review using our clinic review tool, if applicable. No additional management support is needed unless otherwise documented below in the visit note. 

## 2014-04-08 NOTE — Progress Notes (Signed)
   Subjective:    Patient ID: Suzanne Villanueva, female    DOB: 15-Sep-1980, 33 y.o.   MRN: [id number]  HPI CPE- UTD on GYN Tenny Craw).  [facility]   Review of Systems Patient reports no vision/ hearing changes, adenopathy,fever, weight change,  persistant/recurrent hoarseness , swallowing issues, chest pain, palpitations, edema, persistant/recurrent cough, hemoptysis, dyspnea (rest/exertional/paroxysmal nocturnal), gastrointestinal bleeding (melena, rectal bleeding), bowel changes, GU symptoms (dysuria, hematuria, incontinence), Gyn symptoms (abnormal  bleeding, pain),  syncope, focal weakness, memory loss, numbness & tingling, skin/hair/nail changes, abnormal bruising or bleeding, anxiety, or depression.  + abd pain, GERD    Objective:   Physical Exam General Appearance:    Alert, cooperative, no distress, appears stated age, obese  Head:    Normocephalic, without obvious abnormality, atraumatic  Eyes:    PERRL, conjunctiva/corneas clear, EOM's intact, fundi    benign, both eyes  Ears:    Normal TM's and external ear canals, both ears  Nose:   Nares normal, septum midline, mucosa normal, no drainage    or sinus tenderness  Throat:   Lips, mucosa, and tongue normal; teeth and gums normal  Neck:   Supple, symmetrical, trachea midline, no adenopathy;    Thyroid: no enlargement/tenderness/nodules  Back:     Symmetric, no curvature, ROM normal, no CVA tenderness  Lungs:     Clear to auscultation bilaterally, respirations unlabored  Chest Wall:    No tenderness or deformity   Heart:    Regular rate and rhythm, S1 and S2 normal, no murmur, rub   or gallop  Breast Exam:    Deferred to GYN  Abdomen:     Soft, non-tender, bowel sounds active all four quadrants,    no masses, no organomegaly  Genitalia:    Deferred to GYN  Rectal:    Extremities:   Extremities normal, atraumatic, no cyanosis or edema  Pulses:   2+ and symmetric all extremities  Skin:   Skin  color, texture, turgor normal, no rashes or lesions  Lymph nodes:   Cervical, supraclavicular, and axillary nodes normal  Neurologic:   CNII-XII intact, normal strength, sensation and reflexes    throughout          Assessment & Plan:

## 2014-04-08 NOTE — Assessment & Plan Note (Signed)
Pt's PE WNL w/ exception of obesity.  UTD on GYN (Dr Tenny Craw).  Check labs.  Anticipatory guidance provided.

## 2014-04-09 ENCOUNTER — Other Ambulatory Visit: Payer: Self-pay | Admitting: General Practice

## 2014-04-09 LAB — LIPID PANEL
CHOL/HDL RATIO: 4
Cholesterol: 164 mg/dL (ref 0–200)
HDL: 42 mg/dL (ref >39.00)
LDL Cholesterol: 96 mg/dL (ref 0–99)
NONHDL: 122
Triglycerides: 129 mg/dL (ref 0.0–149.0)
VLDL: 25.8 mg/dL (ref 0.0–40.0)

## 2014-04-09 LAB — TSH: TSH: 2.66 u[IU]/mL (ref 0.35–4.50)

## 2014-04-09 MED ORDER — VITAMIN D (ERGOCALCIFEROL) 1.25 MG (50000 UNIT) PO CAPS
50000.0000 [IU] | ORAL_CAPSULE | ORAL | Status: DC
Start: 2014-04-09 — End: 2014-10-08

## 2014-04-20 ENCOUNTER — Encounter: Payer: Self-pay | Admitting: Family Medicine

## 2014-05-04 ENCOUNTER — Encounter: Payer: Self-pay | Admitting: Family Medicine

## 2014-06-04 ENCOUNTER — Other Ambulatory Visit: Payer: Self-pay | Admitting: General Practice

## 2014-06-04 MED ORDER — ESCITALOPRAM OXALATE 10 MG PO TABS: 10.0000 mg | ORAL_TABLET | Freq: Every day | ORAL | Status: DC

## 2014-06-08 ENCOUNTER — Other Ambulatory Visit: Payer: Self-pay | Admitting: General Practice

## 2014-06-08 MED ORDER — LEVOTHYROXINE SODIUM 100 MCG PO TABS: 100.0000 ug | ORAL_TABLET | Freq: Every day | ORAL | Status: DC

## 2014-07-09 ENCOUNTER — Encounter: Payer: Self-pay | Admitting: Family Medicine

## 2014-07-09 ENCOUNTER — Other Ambulatory Visit: Payer: Self-pay | Admitting: Family Medicine

## 2014-07-09 NOTE — Telephone Encounter (Signed)
Med filled.  

## 2014-07-17 ENCOUNTER — Other Ambulatory Visit: Payer: Self-pay | Admitting: General Practice

## 2014-07-17 MED ORDER — LEVOTHYROXINE SODIUM 100 MCG PO TABS: 100.0000 ug | ORAL_TABLET | Freq: Every day | ORAL | Status: DC

## 2014-07-28 ENCOUNTER — Encounter: Payer: Self-pay | Admitting: Family Medicine

## 2014-07-29 ENCOUNTER — Ambulatory Visit (INDEPENDENT_AMBULATORY_CARE_PROVIDER_SITE_OTHER): Payer: 59 | Admitting: Family Medicine

## 2014-07-29 ENCOUNTER — Encounter: Payer: Self-pay | Admitting: Family Medicine

## 2014-07-29 VITALS — BP 130/80 | HR 84 | Temp 98.0°F | Resp 16 | Wt 307.0 lb

## 2014-07-29 DIAGNOSIS — F4310 Post-traumatic stress disorder, unspecified: Secondary | ICD-10-CM

## 2014-07-29 MED ORDER — ALPRAZOLAM 0.25 MG PO TABS: 0.2500 mg | ORAL_TABLET | Freq: Two times a day (BID) | ORAL | Status: DC | PRN

## 2014-07-29 MED ORDER — ESCITALOPRAM OXALATE 20 MG PO TABS: 20.0000 mg | ORAL_TABLET | Freq: Every day | ORAL | Status: DC

## 2014-07-29 NOTE — Progress Notes (Signed)
Pre visit review using our clinic review tool, if applicable. No additional management support is needed unless otherwise documented below in the visit note. 

## 2014-07-29 NOTE — Progress Notes (Signed)
   Subjective:    Patient ID: Suzanne Villanueva, female    DOB: 01-Nov-1980, 34 y.o.   MRN: [id number]  HPI Anxiety/depression- 'on the outside I look calm but on the inside I'm jittery'.  Pt reports 'lashing out' and things are bothering her more easily.  Pt has been off and on Phentermine but the agitation persisted even when off medication.  Pt reports husband has commented on her sxs for a few months, she has really noticed it for the last month.   Review of Systems For ROS see HPI     Objective:   Physical Exam  Constitutional: She is oriented to person, place, and time. She appears well-developed and well-nourished. No distress.  HENT:  Head: Normocephalic and atraumatic.  Cardiovascular: Normal rate, regular rhythm, normal heart sounds and intact distal pulses.   Pulmonary/Chest: Effort normal and breath sounds normal. No respiratory distress. She has no wheezes. She has no rales.  Neurological: She is alert and oriented to person, place, and time.  Skin: Skin is warm and dry.  Psychiatric: She has a normal mood and affect. Her behavior is normal. Thought content normal.  Vitals reviewed.         Assessment & Plan:  Anxiety/depression-

## 2014-07-29 NOTE — Assessment & Plan Note (Signed)
Deteriorated.  Pt doesn't feel the Lexapro is helping at this point.  Short tempered, quick to anger.  Discussed 3 options- maxing out Lexapro, switching to a different med, or adding Wellbutrin to current dose of Lexapro.  Pt would like to increase Lexapro to 20mg  daily.  Will monitor for improvement.  Will also add low dose Alprazolam as needed for increased anxiety.  Reviewed supportive care and red flags that should prompt return.  Pt expressed understanding and is in agreement w/ plan.

## 2014-07-29 NOTE — Patient Instructions (Signed)
Follow up in 6 weeks to recheck mood Increase the Lexapro to 20mg  daily- 2 of what you have at home and 1 of the new prescription Use the Alprazolam as needed for those high anxiety moments Consider restarting counseling to manage the stress Call with any questions or concerns Hang in there!

## 2014-08-04 ENCOUNTER — Encounter: Payer: Self-pay | Admitting: Family Medicine

## 2014-08-05 ENCOUNTER — Encounter: Payer: Self-pay | Admitting: General Practice

## 2014-08-10 ENCOUNTER — Other Ambulatory Visit: Payer: Self-pay | Admitting: Cardiovascular Disease

## 2014-09-14 LAB — HM PAP SMEAR: HM Pap smear: NORMAL

## 2014-09-16 ENCOUNTER — Ambulatory Visit: Payer: 59 | Admitting: Family Medicine

## 2014-09-22 ENCOUNTER — Encounter: Payer: Self-pay | Admitting: General Practice

## 2014-09-28 ENCOUNTER — Other Ambulatory Visit: Payer: Self-pay | Admitting: General Practice

## 2014-09-28 MED ORDER — LEVOTHYROXINE SODIUM 100 MCG PO TABS: 100.0000 ug | ORAL_TABLET | Freq: Every day | ORAL | Status: DC

## 2014-10-08 ENCOUNTER — Ambulatory Visit (INDEPENDENT_AMBULATORY_CARE_PROVIDER_SITE_OTHER): Payer: 59 | Admitting: Family Medicine

## 2014-10-08 ENCOUNTER — Encounter: Payer: Self-pay | Admitting: Family Medicine

## 2014-10-08 VITALS — BP 130/80 | HR 82 | Temp 98.0°F | Resp 16 | Wt 315.4 lb

## 2014-10-08 DIAGNOSIS — F4310 Post-traumatic stress disorder, unspecified: Secondary | ICD-10-CM | POA: Diagnosis not present

## 2014-10-08 DIAGNOSIS — I10 Essential (primary) hypertension: Secondary | ICD-10-CM | POA: Diagnosis not present

## 2014-10-08 LAB — BASIC METABOLIC PANEL
BUN: 16 mg/dL (ref 6–23)
CO2: 27 meq/L (ref 19–32)
Calcium: 8.9 mg/dL (ref 8.4–10.5)
Chloride: 103 mEq/L (ref 96–112)
Creatinine, Ser: 0.78 mg/dL (ref 0.40–1.20)
GFR: 91.17 mL/min (ref >60.00)
Glucose, Bld: 82 mg/dL (ref 70–99)
POTASSIUM: 4.1 meq/L (ref 3.5–5.1)
SODIUM: 136 meq/L (ref 135–145)

## 2014-10-08 NOTE — Patient Instructions (Signed)
Schedule your complete physical in 6 month We'll notify you of your lab results and make any changes if needed Keep up the good work!  You look great! Call with any questions or concerns Happy Spring!!!

## 2014-10-08 NOTE — Assessment & Plan Note (Signed)
Chronic problem.  Adequate control.  Asymptomatic.  Check BMP.  No anticipated med changes. 

## 2014-10-08 NOTE — Progress Notes (Signed)
   Subjective:    Patient ID: Suzanne Villanueva, female    DOB: May 10, 1981, 34 y.o.   MRN: [id number]  HPI PTSD- sxs are much improved since increasing Lexapro.  Rarely using Xanax.  HTN- chronic problem, Losartan HCTZ daily.  Adequate control.  Denies CP, SOB, HAs, visual changes, edema.  Joined [employer] on Monday- has lost 6 lbs   Review of Systems For ROS see HPI     Objective:   Physical Exam  Constitutional: She is oriented to person, place, and time. She appears well-developed and well-nourished. No distress.  HENT:  Head: Normocephalic and atraumatic.  Eyes: Conjunctivae and EOM are normal. Pupils are equal, round, and reactive to light.  Neck: Normal range of motion. Neck supple. No thyromegaly present.  Cardiovascular: Normal rate, regular rhythm, normal heart sounds and intact distal pulses.   No murmur heard. Pulmonary/Chest: Effort normal and breath sounds normal. No respiratory distress.  Abdominal: Soft. She exhibits no distension. There is no tenderness.  Musculoskeletal: She exhibits no edema.  Lymphadenopathy:    She has no cervical adenopathy.  Neurological: She is alert and oriented to person, place, and time.  Skin: Skin is warm and dry.  Psychiatric: She has a normal mood and affect. Her behavior is normal.  Vitals reviewed.         Assessment & Plan:

## 2014-10-08 NOTE — Progress Notes (Signed)
Pre visit review using our clinic review tool, if applicable. No additional management support is needed unless otherwise documented below in the visit note. 

## 2014-10-08 NOTE — Assessment & Plan Note (Signed)
Improved since increasing Lexapro to 20mg  daily.  Pt is very happy w/ how she feels currently.  No changes.

## 2015-01-05 ENCOUNTER — Other Ambulatory Visit: Payer: Self-pay | Admitting: Family Medicine

## 2015-01-05 NOTE — Telephone Encounter (Signed)
Med filled.  

## 2015-03-17 ENCOUNTER — Other Ambulatory Visit: Payer: Self-pay | Admitting: Family Medicine

## 2015-03-17 NOTE — Telephone Encounter (Signed)
Medication filled to pharmacy as requested.   

## 2015-04-21 ENCOUNTER — Encounter: Payer: Self-pay | Admitting: Behavioral Health

## 2015-04-21 ENCOUNTER — Telehealth: Payer: Self-pay | Admitting: Behavioral Health

## 2015-04-21 NOTE — Telephone Encounter (Signed)
Pre-Visit Call completed with patient and chart updated.   Pre-Visit Info documented in Specialty Comments under [employer].    

## 2015-04-21 NOTE — Addendum Note (Signed)
Addended by: Melanee Spry on: 04/21/2015 05:13 PM   Modules accepted: Orders, Medications

## 2015-04-21 NOTE — Telephone Encounter (Signed)
Patient returning your call.

## 2015-04-21 NOTE — Telephone Encounter (Signed)
Unable to reach patient at time of Pre-Visit Call.  Left message for patient to return call when available.    

## 2015-04-22 ENCOUNTER — Encounter: Payer: Self-pay | Admitting: Family Medicine

## 2015-04-22 ENCOUNTER — Other Ambulatory Visit: Payer: Self-pay | Admitting: General Practice

## 2015-04-22 ENCOUNTER — Ambulatory Visit (INDEPENDENT_AMBULATORY_CARE_PROVIDER_SITE_OTHER): Payer: 59 | Admitting: Family Medicine

## 2015-04-22 VITALS — BP 124/80 | HR 68 | Temp 98.3°F | Resp 16 | Ht 67.0 in | Wt 326.4 lb

## 2015-04-22 DIAGNOSIS — Z0000 Encounter for general adult medical examination without abnormal findings: Secondary | ICD-10-CM | POA: Diagnosis not present

## 2015-04-22 LAB — LIPID PANEL
CHOLESTEROL: 186 mg/dL (ref 0–200)
HDL: 47.1 mg/dL (ref >39.00)
LDL CALC: 114 mg/dL — AB (ref 0–99)
NonHDL: 139.1
Total CHOL/HDL Ratio: 4
Triglycerides: 125 mg/dL (ref 0.0–149.0)
VLDL: 25 mg/dL (ref 0.0–40.0)

## 2015-04-22 LAB — CBC WITH DIFFERENTIAL/PLATELET
BASOS PCT: 0.4 % (ref 0.0–3.0)
Basophils Absolute: 0 10*3/uL (ref 0.0–0.1)
Eosinophils Absolute: 0.1 10*3/uL (ref 0.0–0.7)
Eosinophils Relative: 1.6 % (ref 0.0–5.0)
HEMATOCRIT: 33.3 % — AB (ref 36.0–46.0)
Hemoglobin: 11.4 g/dL — ABNORMAL LOW (ref 12.0–15.0)
LYMPHS PCT: 22.6 % (ref 12.0–46.0)
Lymphs Abs: 1.5 10*3/uL (ref 0.7–4.0)
MCHC: 34.2 g/dL (ref 30.0–36.0)
MCV: 88.8 fl (ref 78.0–100.0)
MONOS PCT: 6.5 % (ref 3.0–12.0)
Monocytes Absolute: 0.4 10*3/uL (ref 0.1–1.0)
NEUTROS PCT: 68.9 % (ref 43.0–77.0)
Neutro Abs: 4.5 10*3/uL (ref 1.4–7.7)
Platelets: 269 10*3/uL (ref 150.0–400.0)
RBC: 3.75 Mil/uL — ABNORMAL LOW (ref 3.87–5.11)
RDW: 14.6 % (ref 11.5–15.5)
WBC: 6.5 10*3/uL (ref 4.0–10.5)

## 2015-04-22 LAB — HEPATIC FUNCTION PANEL
ALT: 18 U/L (ref 0–35)
AST: 17 U/L (ref 0–37)
Albumin: 4 g/dL (ref 3.5–5.2)
Alkaline Phosphatase: 55 U/L (ref 39–117)
BILIRUBIN DIRECT: 0.1 mg/dL (ref 0.0–0.3)
BILIRUBIN TOTAL: 0.9 mg/dL (ref 0.2–1.2)
Total Protein: 7.9 g/dL (ref 6.0–8.3)

## 2015-04-22 LAB — VITAMIN D 25 HYDROXY (VIT D DEFICIENCY, FRACTURES): VITD: 18.89 ng/mL — ABNORMAL LOW (ref 30.00–100.00)

## 2015-04-22 LAB — BASIC METABOLIC PANEL
BUN: 13 mg/dL (ref 6–23)
CALCIUM: 9.2 mg/dL (ref 8.4–10.5)
CO2: 29 meq/L (ref 19–32)
Chloride: 102 mEq/L (ref 96–112)
Creatinine, Ser: 0.88 mg/dL (ref 0.40–1.20)
GFR: 79.06 mL/min (ref >60.00)
Glucose, Bld: 86 mg/dL (ref 70–99)
POTASSIUM: 3.9 meq/L (ref 3.5–5.1)
SODIUM: 138 meq/L (ref 135–145)

## 2015-04-22 LAB — TSH: TSH: 77.75 u[IU]/mL — ABNORMAL HIGH (ref 0.35–4.50)

## 2015-04-22 MED ORDER — PANTOPRAZOLE SODIUM 40 MG PO TBEC: 40.0000 mg | DELAYED_RELEASE_TABLET | Freq: Two times a day (BID) | ORAL | Status: DC

## 2015-04-22 MED ORDER — LEVOTHYROXINE SODIUM 150 MCG PO TABS: 150.0000 ug | ORAL_TABLET | Freq: Every day | ORAL | Status: DC

## 2015-04-22 MED ORDER — VITAMIN D (ERGOCALCIFEROL) 1.25 MG (50000 UNIT) PO CAPS: 50000.0000 [IU] | ORAL_CAPSULE | ORAL | Status: AC

## 2015-04-22 NOTE — Progress Notes (Signed)
   Subjective:    Patient ID: Suzanne Villanueva, female    DOB: Mar 16, 1981, 34 y.o.   MRN: [id number]  HPI CPE- UTD on GYN.  No concerns today.  Pt has gained another 10 lbs.  Attempting to get some exercise.   Review of Systems Patient reports no vision/ hearing changes, adenopathy,fever, weight change,  persistant/recurrent hoarseness , swallowing issues, chest pain, palpitations, edema, persistant/recurrent cough, hemoptysis, dyspnea (rest/exertional/paroxysmal nocturnal), gastrointestinal bleeding (melena, rectal bleeding), abdominal pain, significant heartburn, bowel changes, GU symptoms (dysuria, hematuria, incontinence), Gyn symptoms (abnormal  bleeding, pain),  syncope, focal weakness, memory loss, numbness & tingling, skin/hair/nail changes, abnormal bruising or bleeding, anxiety, or depression.     Objective:   Physical Exam General Appearance:    Alert, cooperative, no distress, appears stated age, obese  Head:    Normocephalic, without obvious abnormality, atraumatic  Eyes:    PERRL, conjunctiva/corneas clear, EOM's intact, fundi    benign, both eyes  Ears:    Normal TM's and external ear canals, both ears  Nose:   Nares normal, septum midline, mucosa normal, no drainage    or sinus tenderness  Throat:   Lips, mucosa, and tongue normal; teeth and gums normal  Neck:   Supple, symmetrical, trachea midline, no adenopathy;    Thyroid: no enlargement/tenderness/nodules  Back:     Symmetric, no curvature, ROM normal, no CVA tenderness  Lungs:     Clear to auscultation bilaterally, respirations unlabored  Chest Wall:    No tenderness or deformity   Heart:    Regular rate and rhythm, S1 and S2 normal, no murmur, rub   or gallop  Breast Exam:    Deferred to GYN  Abdomen:     Soft, non-tender, bowel sounds active all four quadrants,    no masses, no organomegaly  Genitalia:    Deferred to GYN  Rectal:    Extremities:   Extremities normal, atraumatic, no cyanosis or edema  Pulses:    2+ and symmetric all extremities  Skin:   Skin color, texture, turgor normal, no rashes or lesions  Lymph nodes:   Cervical, supraclavicular, and axillary nodes normal  Neurologic:   CNII-XII intact, normal strength, sensation and reflexes    throughout          Assessment & Plan:

## 2015-04-22 NOTE — Assessment & Plan Note (Signed)
Pt's PE WNL w/ exception of obesity.  UTD on GYN.  Check labs.  Anticipatory guidance provided.  

## 2015-04-22 NOTE — Assessment & Plan Note (Signed)
Ongoing issue for pt.  She has gained another 10 lbs.  Stressed need for healthy diet and regular exercise.  Check labs to risk stratify.  Will continue to follow.

## 2015-04-22 NOTE — Patient Instructions (Signed)
Follow up in 6 months to recheck BP and thyroid We'll notify you of your lab results and make any changes if needed Continue to work on healthy diet and regular exercise- you can do it! Call with any questions or concerns If you want to join [place] at the new [place] office, any scheduled appointments will automatically transfer and we will see you at [address] [place] [address] [place], [place] [address]  Happy Thanksgiving!!!

## 2015-04-22 NOTE — Telephone Encounter (Signed)
Pt is returning your call.   CB: [phone]

## 2015-04-22 NOTE — Progress Notes (Signed)
Pre visit review using our clinic review tool, if applicable. No additional management support is needed unless otherwise documented below in the visit note. 

## 2015-05-04 ENCOUNTER — Encounter: Payer: Self-pay | Admitting: Family Medicine

## 2015-05-04 DIAGNOSIS — R1310 Dysphagia, unspecified: Secondary | ICD-10-CM

## 2015-05-04 NOTE — Telephone Encounter (Signed)
Referral placed for Gi, per Mychart message. Pt would prefer a location in [place].

## 2015-05-23 ENCOUNTER — Encounter: Payer: Self-pay | Admitting: Family Medicine

## 2015-05-28 ENCOUNTER — Other Ambulatory Visit: Payer: Self-pay | Admitting: Cardiovascular Disease

## 2015-05-31 NOTE — Telephone Encounter (Signed)
Please advise on refill as patient is prn follow up.

## 2015-05-31 NOTE — Telephone Encounter (Signed)
Please forward to PCP as they are following the pt closely.

## 2015-06-21 ENCOUNTER — Other Ambulatory Visit: Payer: Self-pay | Admitting: Cardiovascular Disease

## 2015-07-09 ENCOUNTER — Ambulatory Visit: Payer: Self-pay

## 2015-07-09 ENCOUNTER — Other Ambulatory Visit: Payer: Self-pay | Admitting: Occupational Medicine

## 2015-07-09 DIAGNOSIS — M549 Dorsalgia, unspecified: Secondary | ICD-10-CM

## 2015-07-27 ENCOUNTER — Other Ambulatory Visit: Payer: Self-pay | Admitting: Family Medicine

## 2015-07-27 NOTE — Telephone Encounter (Signed)
Medication filled to pharmacy as requested.   

## 2015-08-09 ENCOUNTER — Encounter: Payer: Self-pay | Admitting: Family Medicine

## 2015-08-10 MED ORDER — LOSARTAN POTASSIUM-HCTZ 100-25 MG PO TABS: 1.0000 | ORAL_TABLET | Freq: Every day | ORAL | Status: DC

## 2015-08-10 NOTE — Telephone Encounter (Signed)
Medication filled to pharmacy as requested.   

## 2015-08-13 ENCOUNTER — Other Ambulatory Visit: Payer: Self-pay | Admitting: Family Medicine

## 2015-08-13 NOTE — Telephone Encounter (Signed)
Med denied, pt should continue OTC vitamin D 2000iu daily.  

## 2015-08-18 ENCOUNTER — Other Ambulatory Visit: Payer: Self-pay | Admitting: General Practice

## 2015-08-18 ENCOUNTER — Encounter: Payer: Self-pay | Admitting: Family Medicine

## 2015-08-18 MED ORDER — LOSARTAN POTASSIUM-HCTZ 100-25 MG PO TABS: 1.0000 | ORAL_TABLET | Freq: Every day | ORAL | Status: DC

## 2015-08-30 ENCOUNTER — Encounter: Payer: Self-pay | Admitting: Family Medicine

## 2015-09-01 ENCOUNTER — Ambulatory Visit: Payer: 59 | Admitting: Family Medicine

## 2015-09-17 ENCOUNTER — Ambulatory Visit (INDEPENDENT_AMBULATORY_CARE_PROVIDER_SITE_OTHER): Payer: 59 | Admitting: Family Medicine

## 2015-09-17 ENCOUNTER — Encounter: Payer: Self-pay | Admitting: Family Medicine

## 2015-09-17 VITALS — BP 128/82 | HR 79 | Temp 98.5°F | Resp 20 | Ht 67.0 in | Wt 321.6 lb

## 2015-09-17 DIAGNOSIS — Z0000 Encounter for general adult medical examination without abnormal findings: Secondary | ICD-10-CM

## 2015-09-17 LAB — CBC WITH DIFFERENTIAL/PLATELET
BASOS PCT: 0.5 % (ref 0.0–3.0)
Basophils Absolute: 0 10*3/uL (ref 0.0–0.1)
EOS ABS: 0 10*3/uL (ref 0.0–0.7)
EOS PCT: 1 % (ref 0.0–5.0)
HEMATOCRIT: 36.8 % (ref 36.0–46.0)
HEMOGLOBIN: 12.2 g/dL (ref 12.0–15.0)
LYMPHS PCT: 36.1 % (ref 12.0–46.0)
Lymphs Abs: 1.7 10*3/uL (ref 0.7–4.0)
MCHC: 33.2 g/dL (ref 30.0–36.0)
MCV: 89.1 fl (ref 78.0–100.0)
MONO ABS: 0.4 10*3/uL (ref 0.1–1.0)
Monocytes Relative: 7.7 % (ref 3.0–12.0)
Neutro Abs: 2.6 10*3/uL (ref 1.4–7.7)
Neutrophils Relative %: 54.7 % (ref 43.0–77.0)
Platelets: 216 10*3/uL (ref 150.0–400.0)
RBC: 4.13 Mil/uL (ref 3.87–5.11)
RDW: 14.6 % (ref 11.5–15.5)
WBC: 4.8 10*3/uL (ref 4.0–10.5)

## 2015-09-17 LAB — BASIC METABOLIC PANEL
BUN: 10 mg/dL (ref 6–23)
CALCIUM: 9.1 mg/dL (ref 8.4–10.5)
CO2: 31 mEq/L (ref 19–32)
CREATININE: 0.83 mg/dL (ref 0.40–1.20)
Chloride: 103 mEq/L (ref 96–112)
GFR: 84.36 mL/min (ref >60.00)
Glucose, Bld: 79 mg/dL (ref 70–99)
Potassium: 3.4 mEq/L — ABNORMAL LOW (ref 3.5–5.1)
Sodium: 140 mEq/L (ref 135–145)

## 2015-09-17 LAB — LIPID PANEL
CHOL/HDL RATIO: 4
Cholesterol: 152 mg/dL (ref 0–200)
HDL: 33.7 mg/dL — AB (ref >39.00)
LDL Cholesterol: 83 mg/dL (ref 0–99)
NONHDL: 117.83
TRIGLYCERIDES: 175 mg/dL — AB (ref 0.0–149.0)
VLDL: 35 mg/dL (ref 0.0–40.0)

## 2015-09-17 LAB — TSH: TSH: 3.89 u[IU]/mL (ref 0.35–4.50)

## 2015-09-17 LAB — HEPATIC FUNCTION PANEL
ALBUMIN: 4.2 g/dL (ref 3.5–5.2)
ALT: 21 U/L (ref 0–35)
AST: 23 U/L (ref 0–37)
Alkaline Phosphatase: 50 U/L (ref 39–117)
BILIRUBIN TOTAL: 0.8 mg/dL (ref 0.2–1.2)
Bilirubin, Direct: 0.2 mg/dL (ref 0.0–0.3)
Total Protein: 7.7 g/dL (ref 6.0–8.3)

## 2015-09-17 LAB — VITAMIN D 25 HYDROXY (VIT D DEFICIENCY, FRACTURES): VITD: 25.5 ng/mL — AB (ref 30.00–100.00)

## 2015-09-17 NOTE — Patient Instructions (Signed)
Follow up in 6 months to recheck weight loss progress We'll notify you of your lab results and make any changes if needed Keep up the good work on healthy diet and regular exercise- you can do it! Call with any questions or concerns Thanks for sticking with [place]! Happy Spring!!!

## 2015-09-17 NOTE — Assessment & Plan Note (Signed)
Pt's PE WNL w/ exception of obesity.  UTD on GYN.  Check labs.  Anticipatory guidance provided.  

## 2015-09-17 NOTE — Progress Notes (Signed)
   Subjective:    Patient ID: Suzanne Villanueva, female    DOB: [birth year], 35 y.o.   MRN: [id number]  HPI CPE- UTD on GYN.  No concerns.     Review of Systems Patient reports no vision/ hearing changes, adenopathy,fever, weight change,  persistant/recurrent hoarseness , swallowing issues, chest pain, palpitations, edema, persistant/recurrent cough, hemoptysis, dyspnea (rest/exertional/paroxysmal nocturnal), gastrointestinal bleeding (melena, rectal bleeding), abdominal pain, significant heartburn, bowel changes, GU symptoms (dysuria, hematuria, incontinence), Gyn symptoms (abnormal  bleeding, pain),  syncope, focal weakness, memory loss, numbness & tingling, skin/hair/nail changes, abnormal bruising or bleeding, anxiety, or depression.     Objective:   Physical Exam General Appearance:    Alert, cooperative, no distress, appears stated age, obese  Head:    Normocephalic, without obvious abnormality, atraumatic  Eyes:    PERRL, conjunctiva/corneas clear, EOM's intact, fundi    benign, both eyes  Ears:    Normal TM's and external ear canals, both ears  Nose:   Nares normal, septum midline, mucosa normal, no drainage    or sinus tenderness  Throat:   Lips, mucosa, and tongue normal; teeth and gums normal  Neck:   Supple, symmetrical, trachea midline, no adenopathy;    Thyroid: no enlargement/tenderness/nodules  Back:     Symmetric, no curvature, ROM normal, no CVA tenderness  Lungs:     Clear to auscultation bilaterally, respirations unlabored  Chest Wall:    No tenderness or deformity   Heart:    Regular rate and rhythm, S1 and S2 normal, no murmur, rub   or gallop  Breast Exam:    Deferred to GYN  Abdomen:     Soft, non-tender, bowel sounds active all four quadrants,    no masses, no organomegaly  Genitalia:    Deferred to GYN  Rectal:    Extremities:   Extremities normal, atraumatic, no cyanosis or edema  Pulses:   2+ and symmetric all extremities  Skin:   Skin color, texture,  turgor normal, no rashes or lesions  Lymph nodes:   Cervical, supraclavicular, and axillary nodes normal  Neurologic:   CNII-XII intact, normal strength, sensation and reflexes    throughout          Assessment & Plan:

## 2015-09-19 ENCOUNTER — Encounter: Payer: Self-pay | Admitting: Family Medicine

## 2015-09-20 ENCOUNTER — Other Ambulatory Visit: Payer: Self-pay | Admitting: General Practice

## 2015-09-20 MED ORDER — VITAMIN D (ERGOCALCIFEROL) 1.25 MG (50000 UNIT) PO CAPS: 50000.0000 [IU] | ORAL_CAPSULE | ORAL | Status: DC

## 2015-10-06 ENCOUNTER — Ambulatory Visit: Payer: Self-pay | Admitting: Family Medicine

## 2015-10-18 ENCOUNTER — Encounter: Payer: Self-pay | Admitting: Family Medicine

## 2015-10-18 DIAGNOSIS — M519 Unspecified thoracic, thoracolumbar and lumbosacral intervertebral disc disorder: Secondary | ICD-10-CM

## 2015-10-21 ENCOUNTER — Ambulatory Visit: Payer: Self-pay | Admitting: Family Medicine

## 2015-11-08 ENCOUNTER — Encounter: Payer: Self-pay | Admitting: Family Medicine

## 2015-11-14 ENCOUNTER — Encounter: Payer: Self-pay | Admitting: Family Medicine

## 2015-11-16 MED ORDER — HYDROCODONE-ACETAMINOPHEN 5-325 MG PO TABS: 1.0000 | ORAL_TABLET | Freq: Four times a day (QID) | ORAL | Status: DC | PRN

## 2015-11-16 NOTE — Telephone Encounter (Signed)
Medication filled to pharmacy as requested.   

## 2015-11-17 ENCOUNTER — Ambulatory Visit (INDEPENDENT_AMBULATORY_CARE_PROVIDER_SITE_OTHER): Payer: 59 | Admitting: General Practice

## 2015-11-17 DIAGNOSIS — Z23 Encounter for immunization: Secondary | ICD-10-CM

## 2015-11-30 ENCOUNTER — Encounter: Payer: Self-pay | Admitting: Family Medicine

## 2015-12-01 MED ORDER — TRAMADOL HCL 50 MG PO TABS: 50.0000 mg | ORAL_TABLET | Freq: Three times a day (TID) | ORAL | Status: DC | PRN

## 2015-12-01 NOTE — Telephone Encounter (Signed)
Medication filled to pharmacy as requested.   

## 2015-12-15 LAB — HM PAP SMEAR

## 2015-12-22 ENCOUNTER — Encounter: Payer: Self-pay | Admitting: General Practice

## 2015-12-27 ENCOUNTER — Encounter: Payer: Self-pay | Admitting: Family Medicine

## 2015-12-27 ENCOUNTER — Other Ambulatory Visit: Payer: Self-pay | Admitting: Family Medicine

## 2015-12-27 MED ORDER — TRAMADOL HCL 50 MG PO TABS: 50.0000 mg | ORAL_TABLET | Freq: Three times a day (TID) | ORAL | Status: DC | PRN

## 2015-12-27 NOTE — Telephone Encounter (Signed)
Pharmacy called in refill request for Tramadol    Pharmacy: [id number] - [place], [place] - [address]

## 2015-12-27 NOTE — Telephone Encounter (Signed)
Last OV 09/17/15 Tramadol last filled 12/01/15 #60 with 0  Message routed to [facility] as pt still has not heard anything about pain management.

## 2015-12-27 NOTE — Telephone Encounter (Signed)
Medication filled to pharmacy as requested.   

## 2016-01-16 ENCOUNTER — Other Ambulatory Visit: Payer: Self-pay | Admitting: Family Medicine

## 2016-01-17 ENCOUNTER — Other Ambulatory Visit: Payer: Self-pay | Admitting: Family Medicine

## 2016-01-21 ENCOUNTER — Other Ambulatory Visit: Payer: Self-pay | Admitting: Family Medicine

## 2016-01-21 NOTE — Telephone Encounter (Signed)
Last oV 09/17/15 Tramadol last filled 12/27/15 #60 with 0

## 2016-01-24 MED ORDER — TRAMADOL HCL 50 MG PO TABS: 50.0000 mg | ORAL_TABLET | Freq: Three times a day (TID) | ORAL | 0 refills | Status: DC | PRN

## 2016-01-24 NOTE — Telephone Encounter (Signed)
Medication filled to pharmacy as requested.   

## 2016-02-14 ENCOUNTER — Other Ambulatory Visit: Payer: Self-pay | Admitting: Family Medicine

## 2016-02-14 NOTE — Telephone Encounter (Signed)
Last OV 09/17/15 Tramadol last filled 01/24/16 #60 with 0

## 2016-02-14 NOTE — Telephone Encounter (Signed)
Medication filled to pharmacy as requested.   

## 2016-03-06 ENCOUNTER — Other Ambulatory Visit: Payer: Self-pay | Admitting: Family Medicine

## 2016-03-06 MED ORDER — TRAMADOL HCL 50 MG PO TABS: 50.0000 mg | ORAL_TABLET | Freq: Three times a day (TID) | ORAL | 0 refills | Status: DC | PRN

## 2016-03-06 NOTE — Telephone Encounter (Signed)
Last OV 09/17/15 Tramadol last filled 02/14/16 #60 with 0   Pain mgmt referral is 03/17/16

## 2016-03-06 NOTE — Telephone Encounter (Signed)
Medication filled to pharmacy as requested.   

## 2016-03-12 ENCOUNTER — Other Ambulatory Visit: Payer: Self-pay | Admitting: Family Medicine

## 2016-03-13 ENCOUNTER — Encounter: Payer: Self-pay | Admitting: Family Medicine

## 2016-03-13 ENCOUNTER — Ambulatory Visit (INDEPENDENT_AMBULATORY_CARE_PROVIDER_SITE_OTHER): Payer: 59 | Admitting: Family Medicine

## 2016-03-13 VITALS — BP 126/78 | HR 89 | Temp 98.0°F | Resp 16 | Ht 67.0 in | Wt 296.0 lb

## 2016-03-13 DIAGNOSIS — E039 Hypothyroidism, unspecified: Secondary | ICD-10-CM | POA: Diagnosis not present

## 2016-03-13 DIAGNOSIS — N63 Unspecified lump in breast: Secondary | ICD-10-CM

## 2016-03-13 DIAGNOSIS — I10 Essential (primary) hypertension: Secondary | ICD-10-CM

## 2016-03-13 DIAGNOSIS — N6310 Unspecified lump in the right breast, unspecified quadrant: Secondary | ICD-10-CM

## 2016-03-13 LAB — BASIC METABOLIC PANEL
BUN: 12 mg/dL (ref 6–23)
CO2: 30 meq/L (ref 19–32)
Calcium: 9.3 mg/dL (ref 8.4–10.5)
Chloride: 102 mEq/L (ref 96–112)
Creatinine, Ser: 0.87 mg/dL (ref 0.40–1.20)
GFR: 79.66 mL/min (ref >60.00)
GLUCOSE: 95 mg/dL (ref 70–99)
POTASSIUM: 4.1 meq/L (ref 3.5–5.1)
SODIUM: 140 meq/L (ref 135–145)

## 2016-03-13 LAB — CBC WITH DIFFERENTIAL/PLATELET
BASOS ABS: 0 10*3/uL (ref 0.0–0.1)
BASOS PCT: 0.6 % (ref 0.0–3.0)
EOS ABS: 0.1 10*3/uL (ref 0.0–0.7)
Eosinophils Relative: 0.8 % (ref 0.0–5.0)
HEMATOCRIT: 33.8 % — AB (ref 36.0–46.0)
HEMOGLOBIN: 11.4 g/dL — AB (ref 12.0–15.0)
LYMPHS PCT: 24.4 % (ref 12.0–46.0)
Lymphs Abs: 1.6 10*3/uL (ref 0.7–4.0)
MCHC: 33.6 g/dL (ref 30.0–36.0)
MCV: 88.4 fl (ref 78.0–100.0)
MONOS PCT: 9.1 % (ref 3.0–12.0)
Monocytes Absolute: 0.6 10*3/uL (ref 0.1–1.0)
NEUTROS ABS: 4.2 10*3/uL (ref 1.4–7.7)
Neutrophils Relative %: 65.1 % (ref 43.0–77.0)
PLATELETS: 310 10*3/uL (ref 150.0–400.0)
RBC: 3.83 Mil/uL — ABNORMAL LOW (ref 3.87–5.11)
RDW: 13.5 % (ref 11.5–15.5)
WBC: 6.5 10*3/uL (ref 4.0–10.5)

## 2016-03-13 LAB — TSH: TSH: 1.2 u[IU]/mL (ref 0.35–4.50)

## 2016-03-13 NOTE — Progress Notes (Signed)
Pre visit review using our clinic review tool, if applicable. No additional management support is needed unless otherwise documented below in the visit note. 

## 2016-03-13 NOTE — Assessment & Plan Note (Signed)
Chronic problem.  Currently asymptomatic w/ exception of hair loss.  Check labs.  Adjust meds prn

## 2016-03-13 NOTE — Progress Notes (Signed)
   Subjective:    Patient ID: Suzanne Villanueva, female    DOB: 1980/07/14, 35 y.o.   MRN: [id number]  HPI HTN- chronic problem, on Losartan HCTZ daily w/ good control.  Pt has lost 26 lbs since CPE in March.  Exercising regularly.  No CP, SOB, HAs, visual changes, edema.  Hypothyroid- chronic problem, on Levothyroxine daily.  Some hair loss.  No excessive fatigue, no constipation or diarrhea.  R breast lesion- pt has 'hard knot that is sore to touch' under R breast.  Noticed overnight.  + redness.   Review of Systems For ROS see HPI     Objective:   Physical Exam  Constitutional: She is oriented to person, place, and time. She appears well-developed and well-nourished. No distress.  obese  HENT:  Head: Normocephalic and atraumatic.  Eyes: Conjunctivae and EOM are normal. Pupils are equal, round, and reactive to light.  Neck: Normal range of motion. Neck supple. No thyromegaly present.  Cardiovascular: Normal rate, regular rhythm, normal heart sounds and intact distal pulses.   No murmur heard. Pulmonary/Chest: Effort normal and breath sounds normal. No respiratory distress. Right breast exhibits mass (firm, freely mobile, tender area at inferior medial areolar border), skin change (mild redness overlying small lump at areolar border) and tenderness. Right breast exhibits no inverted nipple and no nipple discharge. Left breast exhibits no inverted nipple, no mass, no nipple discharge, no skin change and no tenderness.    Abdominal: Soft. She exhibits no distension. There is no tenderness.  Musculoskeletal: She exhibits no edema.  Lymphadenopathy:    She has no cervical adenopathy.  Neurological: She is alert and oriented to person, place, and time.  Skin: Skin is warm and dry.  Psychiatric: She has a normal mood and affect. Her behavior is normal.  Vitals reviewed.         Assessment & Plan:

## 2016-03-13 NOTE — Assessment & Plan Note (Signed)
Chronic problem.  Well controlled today.  Asymptomatic.  Check labs.  No anticipated med changes.  Will follow. 

## 2016-03-13 NOTE — Patient Instructions (Signed)
Schedule your complete physical in 6 months We'll notify you of your lab results and make any changes if needed We'll call you with your imaging appt for the breast area Apply hot compresses to the area to help bring it to a head or reabsorb the area Keep up the good work on healthy diet and regular exercise- you're doing great!!! Call with any questions or concerns Happy Fall!!!

## 2016-03-13 NOTE — Assessment & Plan Note (Signed)
New.  Suspect this is an abscess or early infxn that has not yet come to a head but given it's location, will get [place] to r/o something more serious.  Pt to use hot compresses.  No need for abx at this time.  Will follow.  Pt expressed understanding and is in agreement w/ plan.

## 2016-03-15 ENCOUNTER — Other Ambulatory Visit: Payer: Self-pay | Admitting: Family Medicine

## 2016-03-15 DIAGNOSIS — N6310 Unspecified lump in the right breast, unspecified quadrant: Secondary | ICD-10-CM

## 2016-03-17 ENCOUNTER — Encounter: Payer: 59 | Admitting: Physical Medicine & Rehabilitation

## 2016-03-22 ENCOUNTER — Ambulatory Visit
Admission: RE | Admit: 2016-03-22 | Discharge: 2016-03-22 | Disposition: A | Discharge status: Home / Self Care | Payer: 59 | Source: Ambulatory Visit | Attending: Family Medicine | Admitting: Family Medicine

## 2016-03-22 DIAGNOSIS — N6310 Unspecified lump in the right breast, unspecified quadrant: Secondary | ICD-10-CM

## 2016-03-23 ENCOUNTER — Ambulatory Visit (HOSPITAL_BASED_OUTPATIENT_CLINIC_OR_DEPARTMENT_OTHER): Payer: 59 | Admitting: Physical Medicine & Rehabilitation

## 2016-03-23 ENCOUNTER — Encounter: Payer: Self-pay | Admitting: Physical Medicine & Rehabilitation

## 2016-03-23 ENCOUNTER — Encounter: Payer: 59 | Attending: Physical Medicine & Rehabilitation

## 2016-03-23 VITALS — BP 116/79 | HR 97

## 2016-03-23 DIAGNOSIS — F4311 Post-traumatic stress disorder, acute: Secondary | ICD-10-CM | POA: Insufficient documentation

## 2016-03-23 DIAGNOSIS — M1288 Other specific arthropathies, not elsewhere classified, other specified site: Secondary | ICD-10-CM

## 2016-03-23 DIAGNOSIS — G8929 Other chronic pain: Secondary | ICD-10-CM | POA: Insufficient documentation

## 2016-03-23 DIAGNOSIS — Z833 Family history of diabetes mellitus: Secondary | ICD-10-CM | POA: Diagnosis not present

## 2016-03-23 DIAGNOSIS — Z5181 Encounter for therapeutic drug level monitoring: Secondary | ICD-10-CM

## 2016-03-23 DIAGNOSIS — M259 Joint disorder, unspecified: Secondary | ICD-10-CM | POA: Diagnosis not present

## 2016-03-23 DIAGNOSIS — Z8249 Family history of ischemic heart disease and other diseases of the circulatory system: Secondary | ICD-10-CM | POA: Diagnosis not present

## 2016-03-23 DIAGNOSIS — Z7901 Long term (current) use of anticoagulants: Secondary | ICD-10-CM

## 2016-03-23 DIAGNOSIS — I10 Essential (primary) hypertension: Secondary | ICD-10-CM | POA: Diagnosis not present

## 2016-03-23 DIAGNOSIS — Z79899 Other long term (current) drug therapy: Secondary | ICD-10-CM

## 2016-03-23 DIAGNOSIS — Z8619 Personal history of other infectious and parasitic diseases: Secondary | ICD-10-CM | POA: Diagnosis not present

## 2016-03-23 DIAGNOSIS — E039 Hypothyroidism, unspecified: Secondary | ICD-10-CM | POA: Insufficient documentation

## 2016-03-23 DIAGNOSIS — M47816 Spondylosis without myelopathy or radiculopathy, lumbar region: Secondary | ICD-10-CM

## 2016-03-23 DIAGNOSIS — Z87891 Personal history of nicotine dependence: Secondary | ICD-10-CM | POA: Diagnosis not present

## 2016-03-23 DIAGNOSIS — Z8261 Family history of arthritis: Secondary | ICD-10-CM | POA: Diagnosis not present

## 2016-03-23 DIAGNOSIS — M519 Unspecified thoracic, thoracolumbar and lumbosacral intervertebral disc disorder: Secondary | ICD-10-CM | POA: Diagnosis not present

## 2016-03-23 DIAGNOSIS — K219 Gastro-esophageal reflux disease without esophagitis: Secondary | ICD-10-CM | POA: Diagnosis not present

## 2016-03-23 MED ORDER — TRAMADOL HCL 50 MG PO TABS: 100.0000 mg | ORAL_TABLET | Freq: Three times a day (TID) | ORAL | 0 refills | Status: DC | PRN

## 2016-03-23 NOTE — Progress Notes (Signed)
Subjective:    Patient ID: Suzanne Villanueva, female    DOB: [birth year], 35 y.o.   MRN: [id number] Consult by Dr Beverely Low requested for the evaluation of thoracic pain. HPI  35 year old female with history of low back pain. She states that the pain started around the time of her pregnancy. After her C-section. This was approximately 4 years ago. She denies any falls or trauma to the area. She has had workup including lumbar and thoracic MRIs. The thoracic 10/23/2013 MRI showed multilevel degenerative disc, most notable at T10, T11 on the left with extrusion which abuts the cord but does not deform the spinal cord Lumbar MRI from 5 7 2015 showed mild arthrosis L4-L5. Patient does not complain of any radiating pain in the lower extremity. She has no bowel or bladder dysfunction. She works full time as a Radiation protection practitioner. She has a 9-year-old daughter. She was a patient at [facility]. She had been there for at least 2 years. She works in [place] and is trying to get all of her physicians in [place]. I reviewed records from [facility]. Has had urine drug screens which were appropriate for medications prescribed. She's been mainly on hydrocodone 7.5 milligrams 3 times a day. Has really not tried much else other than 50 mg of tramadol prescribed by her PCP, Dr. Beverely Low. This was not helpful.  Patient had sacroiliac injections, which were of temporary benefit. She was considering sacroiliac radiofrequency procedure. However, her co-pay would have been $700 and the patient did not want to pursue this.  She has not had any lumbar medial branch blocks. No epidural injections.  No lower extremity weakness or bowel or bladder dysfunction.  The patient has been on a diet and also walking more for exercise. She has lost 40 pounds since June. Her goal is to come off her antihypertensive medication. She does not have a weight control currently.  Pain Inventory Average Pain 7 Pain Right  Now 6 My pain is constant, burning and aching  In the last 24 hours, has pain interfered with the following? General activity 3 Relation with others 3 Enjoyment of life 3 What TIME of day is your pain at its worst? 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  no  Physicians involved in your care Any changes since last visit?  no Primary care DR. Tabori   Family History  Problem Relation Age of Onset  . Heart attack Father   . Heart disease      parents  . Hypertension      parents  . Arthritis      grandparents  . Diabetes      parents   Social History   Social History  . Marital status: Married    Spouse name: N/A  . Number of children: N/A  . Years of education: N/A   Social History Main Topics  . Smoking status: Former Smoker    Packs/day: 0.50    Quit date: 01/14/2011  . Smokeless tobacco: Never Used  . Alcohol use No  . Drug use: No  . Sexual activity: Yes    Birth control/ protection: None   Other Topics Concern  . None  Social History Narrative  . None   Past Surgical History:  Procedure Laterality Date  . CESAREAN SECTION  09/14/2011   Procedure: CESAREAN SECTION;  Surgeon: Freddrick MarchKendra H. Tenny Crawoss, MD;  Location: [facility];  Service: Gynecology;  Laterality: N/A;  Primary Cesarean Section with birth of baby girl @ 2302  . MYRINGOTOMY    . TONSILLECTOMY  1990  . TYMPANOSTOMY TUBE PLACEMENT     '90, '96, '12  . WISDOM TOOTH EXTRACTION     Past Medical History:  Diagnosis Date  . Abnormal Pap smear   . Abscess    behind the right ear  . Chicken pox   . Complication of anesthesia    Getsvery emotional  . Depression   . GERD  (gastroesophageal reflux disease)   . Heart murmur   . History of CHF (congestive heart failure)    post partum 09/2011 - echo 09/21/11: EF 60-65%, mod MR, mild LAE (due to post partum fluid shifts, HTN, obesity, ?MR)  . Hypertension   . Hypothyroidism   . Migraines   . Moderate mitral regurgitation    needs echo 03/2012  . MRSA (methicillin resistant staph aureus) culture positive   . PTSD (post-traumatic stress disorder)   . Scarlet fever x 2  . Seasonal allergies   . Urinary tract infection    BP 116/79   Pulse 97   SpO2 100%   Opioid Risk Score:   Fall Risk Score:  `1  Depression screen PHQ 2/9  Depression screen [facility] L[facility] 2/9 09/17/2015 04/22/2015 10/08/2014 04/08/2014 03/02/2014  Decreased Interest 0 0 0 0 0  Down, Depressed, Hopeless 0 0 1 0 0  PHQ - 2 Score 0 0 1 0 0     Review of Systems  Constitutional: Positive for unexpected weight change.  All other systems reviewed and are negative.      Objective:   Physical Exam  Constitutional: She is oriented to person, place, and time. She appears well-developed and well-nourished.  Obese female in no acute distress  HENT:  Head: Normocephalic and atraumatic.  Eyes: Conjunctivae and EOM are normal. Pupils are equal, round, and reactive to light.  Neck: Normal range of motion.  Cardiovascular: Normal rate, regular rhythm and normal heart sounds.   Pulmonary/Chest: Effort normal and breath sounds normal. No respiratory distress.  Abdominal: Soft. Bowel sounds are normal. She exhibits no distension. There is no tenderness.  Musculoskeletal:       Right hip: She exhibits normal range of motion, normal strength, no tenderness and no bony tenderness.       Left hip: She exhibits normal range of motion, normal strength, no tenderness and no bony tenderness.       Cervical back: Normal.       Thoracic back: She exhibits decreased range of motion and tenderness. She exhibits no deformity and no spasm.       Lumbar back: She exhibits  decreased range of motion and tenderness. She exhibits no deformity and no spasm.  Lumbar spine has 75% flexion and extension as well as lateral bending. She has pain in the lower lumbar area, mainly with extension.  Negative straight leg raising test  Hip range of motion is normal. Negative. Femoral stretch test Negative FABERS  Neurological: She is alert and oriented to person, place, and time. She has normal strength. She displays no atrophy. No sensory deficit. She exhibits abnormal muscle tone. Coordination and gait normal.  Reflex Scores:      Patellar reflexes are  1+ on the right side and 1+ on the left side.      Achilles reflexes are 1+ on the right side and 1+ on the left side. Sensation intact to pinprick, bilateral L3, L4, L5-S1 dermatomal distribution.    Skin: Skin is warm and dry.  Psychiatric: She has a normal mood and affect. Her speech is normal and behavior is normal. Judgment and thought content normal. Cognition and memory are normal.  Nursing note and vitals reviewed.         Assessment & Plan:  1. Chronic low back pain. This is her primary complaint. Onset was peripartum.  Has responded short-term to sacroiliac injections. Therefore, I think this is the most likely etiology. We discussed sacroiliac RF can be performed here as well. However, the cost would likely be similar. She has no change in her insurance. Have recommended continued home exercise program  In addition, patient has lumbar facet arthropathy with corresponding symptomatology. We talked about lumbar medial branch blocks, which can also be performed. However, if these are only temporary. In benefit, radiofrequency would be the next step and once again her insurance may only pay a portion of the cost. We have discussed pain medications, given the relatively low dose of hydrocodone use, she may actually be controlled on a schedule III or schedule IV medication. We'll start with tramadol 100 mg 3 times  a day, we'll try for 1-2 weeks. Patient will call to notify us how this is helping. If it is not helping, would trial Tylenol No. 4 2 tablets 3 times per day Failing this, would consider Nucynta 75 mg 3 times a day depending on insurance coverage versus resuming hydrocodone 7.5 milligrams 3 times a day  Urine drug screen will be performed and will also help guide decisions

## 2016-03-23 NOTE — Patient Instructions (Signed)
These call us in about a week to tell us how the tramadol 2 tablets 3 times a day is helping. If it is not helping. We will call and Tylenol with codeine No. 4 2 tablets 3 times a day

## 2016-03-24 NOTE — Addendum Note (Signed)
Addended by: Angela Nevin D on: 03/24/2016 03:19 PM   Modules accepted: Orders

## 2016-03-30 LAB — TOXASSURE SELECT,+ANTIDEPR,UR

## 2016-03-31 ENCOUNTER — Encounter: Payer: Self-pay | Admitting: Physical Medicine & Rehabilitation

## 2016-04-03 ENCOUNTER — Telehealth: Payer: Self-pay | Admitting: Physical Medicine & Rehabilitation

## 2016-04-03 NOTE — Telephone Encounter (Signed)
Tramadol is not helping patient, and Dr. Wynn Banker told her that he could switch it to Tylenol #4.  She also did not set up an appointment to see him back because he wanted to wait to see what medication would work for patient.  Please call patient.

## 2016-04-03 NOTE — Telephone Encounter (Signed)
I am verifying with you that you want [place] to put her on Tylenol #4  (per note "Patient will call to notify us how this is helping. If it is not helping, would trial Tylenol No. 4 2 tablets 3 times per day")

## 2016-04-03 NOTE — Telephone Encounter (Signed)
The plan was for patient to trial tramadol for 2 weeks and if that was not successful, then we would try Tylenol No. 4 2 tablets 3 times per day when necessary

## 2016-04-04 MED ORDER — ACETAMINOPHEN-CODEINE #4 300-60 MG PO TABS: 2.0000 | ORAL_TABLET | Freq: Three times a day (TID) | ORAL | 0 refills | Status: DC | PRN

## 2016-04-04 NOTE — Telephone Encounter (Signed)
She was seen 03/23/16 so this week is 2 weeks. I have called in tylenol #4 2 q 8hr prn #180, 0 RF. Message left for Fiza letting her know med called to pharmacy.

## 2016-04-04 NOTE — Progress Notes (Signed)
Urine drug screen for this encounter is consistent for having no narcotics.

## 2016-04-04 NOTE — Addendum Note (Signed)
Addended by: Doreene Eland on: 04/04/2016 08:47 AM   Modules accepted: Orders

## 2016-04-09 ENCOUNTER — Other Ambulatory Visit: Payer: Self-pay | Admitting: Family Medicine

## 2016-04-16 ENCOUNTER — Other Ambulatory Visit: Payer: Self-pay | Admitting: Family Medicine

## 2016-04-28 ENCOUNTER — Telehealth: Payer: Self-pay | Admitting: Physical Medicine & Rehabilitation

## 2016-04-28 NOTE — Telephone Encounter (Signed)
Tylenol #4 isn't working for patient, would like to know what else she can take.  Please call patient at [phone].

## 2016-04-28 NOTE — Telephone Encounter (Signed)
As per my last note, next step is. Nucynta 75 3 times a day #90

## 2016-05-01 MED ORDER — TAPENTADOL HCL 75 MG PO TABS: 75.0000 mg | ORAL_TABLET | Freq: Three times a day (TID) | ORAL | 0 refills | Status: DC | PRN

## 2016-05-01 NOTE — Telephone Encounter (Signed)
Rx printed for Dr Wynn Banker to sign.  Should be covered on [facility]. Stacy notified. She will need to sign a CSA when she picks Rx up and make appt with Kirsteins.  She will be out of her Tyl # 4 by then so will not need to bring back.

## 2016-05-08 ENCOUNTER — Other Ambulatory Visit: Payer: Self-pay | Admitting: Family Medicine

## 2016-06-01 ENCOUNTER — Encounter: Payer: 59 | Attending: Physical Medicine & Rehabilitation

## 2016-06-01 ENCOUNTER — Ambulatory Visit (HOSPITAL_BASED_OUTPATIENT_CLINIC_OR_DEPARTMENT_OTHER): Payer: 59 | Admitting: Physical Medicine & Rehabilitation

## 2016-06-01 ENCOUNTER — Encounter: Payer: Self-pay | Admitting: Physical Medicine & Rehabilitation

## 2016-06-01 VITALS — BP 135/84 | HR 77 | Resp 14

## 2016-06-01 DIAGNOSIS — M259 Joint disorder, unspecified: Secondary | ICD-10-CM | POA: Diagnosis not present

## 2016-06-01 DIAGNOSIS — Z8261 Family history of arthritis: Secondary | ICD-10-CM | POA: Diagnosis not present

## 2016-06-01 DIAGNOSIS — I10 Essential (primary) hypertension: Secondary | ICD-10-CM | POA: Diagnosis not present

## 2016-06-01 DIAGNOSIS — Z833 Family history of diabetes mellitus: Secondary | ICD-10-CM | POA: Insufficient documentation

## 2016-06-01 DIAGNOSIS — M1288 Other specific arthropathies, not elsewhere classified, other specified site: Secondary | ICD-10-CM | POA: Diagnosis not present

## 2016-06-01 DIAGNOSIS — Z79899 Other long term (current) drug therapy: Secondary | ICD-10-CM | POA: Insufficient documentation

## 2016-06-01 DIAGNOSIS — M519 Unspecified thoracic, thoracolumbar and lumbosacral intervertebral disc disorder: Secondary | ICD-10-CM | POA: Diagnosis not present

## 2016-06-01 DIAGNOSIS — Z87891 Personal history of nicotine dependence: Secondary | ICD-10-CM | POA: Insufficient documentation

## 2016-06-01 DIAGNOSIS — Z7901 Long term (current) use of anticoagulants: Secondary | ICD-10-CM | POA: Insufficient documentation

## 2016-06-01 DIAGNOSIS — K219 Gastro-esophageal reflux disease without esophagitis: Secondary | ICD-10-CM | POA: Diagnosis not present

## 2016-06-01 DIAGNOSIS — G8929 Other chronic pain: Secondary | ICD-10-CM | POA: Diagnosis not present

## 2016-06-01 DIAGNOSIS — E039 Hypothyroidism, unspecified: Secondary | ICD-10-CM | POA: Diagnosis not present

## 2016-06-01 DIAGNOSIS — F4311 Post-traumatic stress disorder, acute: Secondary | ICD-10-CM | POA: Insufficient documentation

## 2016-06-01 DIAGNOSIS — Z8619 Personal history of other infectious and parasitic diseases: Secondary | ICD-10-CM | POA: Insufficient documentation

## 2016-06-01 DIAGNOSIS — Z8249 Family history of ischemic heart disease and other diseases of the circulatory system: Secondary | ICD-10-CM | POA: Insufficient documentation

## 2016-06-01 DIAGNOSIS — M47816 Spondylosis without myelopathy or radiculopathy, lumbar region: Secondary | ICD-10-CM

## 2016-06-01 MED ORDER — TAPENTADOL HCL 75 MG PO TABS: 75.0000 mg | ORAL_TABLET | Freq: Four times a day (QID) | ORAL | 0 refills | Status: DC | PRN

## 2016-06-01 NOTE — Progress Notes (Signed)
Subjective:    Patient ID: Suzanne Villanueva, female    DOB: 24-Dec-1980, 35 y.o.   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 She has trialed tramadol 100 mg 3 times a day without much relief, then she trialed Tylenol No. 4 with codeine 2 tablets 3 times per day with only somewhat better relief. Most recently she has been on Nucynta 75 mg 3 times per day. She gets good relief with this but does not last very long. No side effects from this. Moderate medication other than some vivid dreams  Pain Inventory Average Pain 6 Pain Right Now 1 My pain is burning and aching  In the last 24 hours, has pain interfered with the following? General activity 2 Relation with others 2 Enjoyment of life 2 What TIME of day is your pain at its worst? daytime Sleep (in general) Fair  Pain is worse with: some activites Pain improves with: medication Relief from Meds: no answer  Mobility walk without assistance ability to climb steps?  yes do you drive?  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  no  Physicians involved in your care Any changes since last visit?  no   Family History  Problem Relation Age of Onset  . Heart attack Father   . Heart disease      parents  . Hypertension      parents  . Arthritis      grandparents  . Diabetes      parents   Social History   Social History  . Marital status: Married    Spouse name: N/A  . Number of children: N/A  . Years of education: N/A   Social History Main Topics  . Smoking status: Former Smoker    Packs/day: 0.50    Quit date: 01/14/2011  . Smokeless tobacco: Never Used  . Alcohol use No  . Drug use: No  . Sexual activity: Yes    Birth control/ protection: None   Other Topics Concern  . None   Social History Narrative  . None   Past Surgical History:    Procedure Laterality Date  . CESAREAN SECTION  09/14/2011   Procedure: CESAREAN SECTION;  Surgeon: Freddrick March. Tenny Craw, MD;  Location: [facility];  Service: Gynecology;  Laterality: N/A;  Primary Cesarean Section with birth of baby girl @ 2302  . MYRINGOTOMY    . TONSILLECTOMY  1990  . TYMPANOSTOMY TUBE PLACEMENT     '90, '96, '12  . WISDOM TOOTH EXTRACTION     Past Medical History:  Diagnosis Date  . Abnormal Pap smear   . Abscess    behind the right ear  . Chicken pox   . Complication of anesthesia    Getsvery emotional  . Depression   . GERD (gastroesophageal reflux disease)   . Heart murmur   . History of CHF (congestive heart failure)    post partum 09/2011 - echo 09/21/11: EF 60-65%, mod MR, mild LAE (due to post partum fluid shifts, HTN, obesity, ?MR)  . Hypertension   . Hypothyroidism   . Migraines   . Moderate mitral regurgitation    needs echo 03/2012  . MRSA (methicillin resistant staph aureus) culture positive   . PTSD (post-traumatic stress disorder)   . Scarlet fever x 2  . Seasonal allergies   .  Urinary tract infection    BP 135/84   Pulse 77   Resp 14   SpO2 98%   Opioid Risk Score:   Fall Risk Score:  `1  Depression screen PHQ 2/9  [facility] 03/23/2016 09/17/2015 04/22/2015 10/08/2014 04/08/2014 03/02/2014  Decreased Interest 1 0 0 0 0 0  Down, Depressed, Hopeless 0 0 0 1 0 0  PHQ - 2 Score 1 0 0 1 0 0  Altered sleeping 2 - - - - -  Tired, decreased energy 1 - - - - -  Change in appetite 0 - - - - -  Feeling bad or failure about yourself  0 - - - - -  Trouble concentrating 0 - - - - -  Moving slowly or fidgety/restless 0 - - - - -  Suicidal thoughts 0 - - - - -  PHQ-9 Score 4 - - - - -  Difficult doing work/chores Not difficult at all - - - - -   Review of Systems  Constitutional: Negative.   HENT: Negative.   Eyes: Negative.   Respiratory: Negative.   Cardiovascular: Negative.   Gastrointestinal: Negative.   Endocrine: Negative.    Genitourinary: Negative.   Musculoskeletal: Negative.   Skin: Negative.   Allergic/Immunologic: Negative.   Neurological: Negative.   Hematological: Negative.   Psychiatric/Behavioral: Negative.   All other systems reviewed and are negative.      Objective:   Physical Exam  Constitutional: She is oriented to person, place, and time. She appears well-developed and well-nourished.  HENT:  Head: Normocephalic and atraumatic.  Eyes: Conjunctivae and EOM are normal. Pupils are equal, round, and reactive to light.  Musculoskeletal:  o tenderness palpation in the lumbar paraspinal or thoracic paraspinal muscles. Range of motion is normal  Neurological: She is alert and oriented to person, place, and time. Coordination and gait normal.  Motor strength is 5/5 bilateral hip flexion, knee extensor, ankle dorsiflexor  Negative straight leg raise  Skin: Skin is warm and dry.  Nursing note and vitals reviewed.         Assessment & Plan:  1. 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 Overall functioning quite well on current medication. Does complain of inadequate duration of relief. Will increase to 75 mg 4 times a day Nucynta Nurse practitioner visit in one month

## 2016-06-01 NOTE — Patient Instructions (Signed)
Will increase to 4 times a day

## 2016-06-29 ENCOUNTER — Encounter: Payer: 59 | Admitting: Registered Nurse

## 2016-07-03 ENCOUNTER — Encounter: Payer: Self-pay | Admitting: Registered Nurse

## 2016-07-03 ENCOUNTER — Encounter: Payer: 59 | Attending: Physical Medicine & Rehabilitation | Admitting: Registered Nurse

## 2016-07-03 VITALS — BP 141/86 | HR 74 | Resp 14

## 2016-07-03 DIAGNOSIS — G8929 Other chronic pain: Secondary | ICD-10-CM | POA: Diagnosis not present

## 2016-07-03 DIAGNOSIS — M259 Joint disorder, unspecified: Secondary | ICD-10-CM | POA: Diagnosis not present

## 2016-07-03 DIAGNOSIS — Z8249 Family history of ischemic heart disease and other diseases of the circulatory system: Secondary | ICD-10-CM | POA: Insufficient documentation

## 2016-07-03 DIAGNOSIS — E039 Hypothyroidism, unspecified: Secondary | ICD-10-CM | POA: Insufficient documentation

## 2016-07-03 DIAGNOSIS — Z87891 Personal history of nicotine dependence: Secondary | ICD-10-CM | POA: Diagnosis not present

## 2016-07-03 DIAGNOSIS — M1288 Other specific arthropathies, not elsewhere classified, other specified site: Secondary | ICD-10-CM | POA: Diagnosis not present

## 2016-07-03 DIAGNOSIS — F4311 Post-traumatic stress disorder, acute: Secondary | ICD-10-CM | POA: Insufficient documentation

## 2016-07-03 DIAGNOSIS — Z833 Family history of diabetes mellitus: Secondary | ICD-10-CM | POA: Insufficient documentation

## 2016-07-03 DIAGNOSIS — Z8619 Personal history of other infectious and parasitic diseases: Secondary | ICD-10-CM | POA: Insufficient documentation

## 2016-07-03 DIAGNOSIS — Z79899 Other long term (current) drug therapy: Secondary | ICD-10-CM | POA: Diagnosis not present

## 2016-07-03 DIAGNOSIS — K219 Gastro-esophageal reflux disease without esophagitis: Secondary | ICD-10-CM | POA: Diagnosis not present

## 2016-07-03 DIAGNOSIS — Z5181 Encounter for therapeutic drug level monitoring: Secondary | ICD-10-CM

## 2016-07-03 DIAGNOSIS — M519 Unspecified thoracic, thoracolumbar and lumbosacral intervertebral disc disorder: Secondary | ICD-10-CM

## 2016-07-03 DIAGNOSIS — Z7901 Long term (current) use of anticoagulants: Secondary | ICD-10-CM | POA: Diagnosis not present

## 2016-07-03 DIAGNOSIS — I10 Essential (primary) hypertension: Secondary | ICD-10-CM | POA: Insufficient documentation

## 2016-07-03 DIAGNOSIS — Z8261 Family history of arthritis: Secondary | ICD-10-CM | POA: Diagnosis not present

## 2016-07-03 DIAGNOSIS — M47816 Spondylosis without myelopathy or radiculopathy, lumbar region: Secondary | ICD-10-CM

## 2016-07-03 MED ORDER — TAPENTADOL HCL 75 MG PO TABS: 75.0000 mg | ORAL_TABLET | Freq: Four times a day (QID) | ORAL | 0 refills | Status: DC | PRN

## 2016-07-03 NOTE — Progress Notes (Signed)
Subjective:    Patient ID: Suzanne Villanueva, female    DOB: 1981/06/16, 36 y.o.   MRN: [id number]  HPI: [name] is a 36 year old female who returns for follow up appointment and medication refill. She states her pain is located in her mid-lower back and left hip.  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  Pain Inventory Average Pain 6 Pain Right Now 2 My pain is sharp, burning and aching  In the last 24 hours, has pain interfered with the following? General activity 2 Relation with others 1 Enjoyment of life 2 What TIME of day is your pain at its worst? daytime Sleep (in general) Good  Pain is worse with: some activites Pain improves with: heat/ice, therapy/exercise, medication and TENS Relief from Meds: 6  Mobility walk without assistance how many minutes can you walk? as needed ability to climb steps?  yes do you drive?  yes Do you have any goals in this area?  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  no  Neuro/Psych spasms  Prior Studies Any changes since last visit?  no  Physicians involved in your care Any changes since last visit?  no   Family History  Problem Relation Age of Onset  . Heart attack Father   . Heart disease      parents  . Hypertension      parents  . Arthritis      grandparents  . Diabetes      parents   Social History   Social History  . Marital status: Married    Spouse name: N/A  . Number of children: N/A  . Years of education: N/A   Social History Main Topics  . Smoking status: Former Smoker    Packs/day: 0.50    Quit date: 01/14/2011  . Smokeless tobacco: Never Used  . Alcohol use No  . Drug use: No  . Sexual activity: Yes    Birth control/ protection: None   Other Topics Concern  . None   Social History Narrative  . None   Past Surgical History:  Procedure Laterality Date  . CESAREAN SECTION  09/14/2011   Procedure: CESAREAN  SECTION;  Surgeon: Freddrick March. Tenny Craw, MD;  Location: [facility];  Service: Gynecology;  Laterality: N/A;  Primary Cesarean Section with birth of baby girl @ 2302  . MYRINGOTOMY    . TONSILLECTOMY  1990  . TYMPANOSTOMY TUBE PLACEMENT     '90, '96, '12  . WISDOM TOOTH EXTRACTION     Past Medical History:  Diagnosis Date  . Abnormal Pap smear   . Abscess    behind the right ear  . Chicken pox   . Complication of anesthesia    Getsvery emotional  . Depression   . GERD (gastroesophageal reflux disease)   . Heart murmur   . History of CHF (congestive heart failure)    post partum 09/2011 - echo 09/21/11: EF 60-65%, mod MR, mild LAE (due to post partum fluid shifts, HTN, obesity, ?MR)  . Hypertension   . Hypothyroidism   . Migraines   . Moderate mitral regurgitation    needs echo 03/2012  . MRSA (methicillin resistant staph aureus) culture positive   . PTSD (post-traumatic stress disorder)   . Scarlet fever x 2  . Seasonal allergies   . Urinary tract infection    BP (!) 168/90   Pulse  74   Resp 14   SpO2 98%   Opioid Risk Score:   Fall Risk Score:  `1  Depression screen PHQ 2/9  Depression screen [facility] 2/9 03/23/2016 09/17/2015 04/22/2015 10/08/2014 04/08/2014 03/02/2014  Decreased Interest 1 0 0 0 0 0  Down, Depressed, Hopeless 0 0 0 1 0 0  PHQ - 2 Score 1 0 0 1 0 0  Altered sleeping 2 - - - - -  Tired, decreased energy 1 - - - - -  Change in appetite 0 - - - - -  Feeling bad or failure about yourself  0 - - - - -  Trouble concentrating 0 - - - - -  Moving slowly or fidgety/restless 0 - - - - -  Suicidal thoughts 0 - - - - -  PHQ-9 Score 4 - - - - -  Difficult doing work/chores Not difficult at all - - - - -    Review of Systems  Constitutional: Negative.   HENT: Negative.   Eyes: Negative.   Respiratory: Negative.   Cardiovascular: Negative.   Gastrointestinal: Negative.   Endocrine: Negative.   Genitourinary: Negative.   Musculoskeletal: Positive for back pain.        Spasms  Skin: Negative.   Allergic/Immunologic: Negative.   Neurological: Negative.   Hematological: Negative.   Psychiatric/Behavioral: Negative.        Objective:   Physical Exam  Constitutional: She is oriented to person, place, and time. She appears well-developed and well-nourished.  HENT:  Head: Normocephalic and atraumatic.  Neck: Normal range of motion. Neck supple.  Cardiovascular: Normal rate and regular rhythm.   Pulmonary/Chest: Effort normal and breath sounds normal.  Musculoskeletal:  Normal Muscle Bulk and Muscle Testing Reveals: Upper Extremities: Full ROM and Muscle Strength 5/5 Back without spinal Tenderness Noted Lower Extremities: Full ROM and Muscle Strength 5/5 Arises from Table with ease Narrow Based Gait  Neurological: She is alert and oriented to person, place, and time.  Skin: Skin is warm and dry.  Psychiatric: She has a normal mood and affect.  Nursing note and vitals reviewed.         Assessment & Plan:  1. 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 Refilled Nucynta 75 mg one tablet every 6 hours as needed. We will continue the opioid monitoring program, this consists of regular clinic visits, examinations, urine drug screen, pill counts as well as use of [place] Controlled Substance Reporting System.  F/U in 1 month

## 2016-07-30 ENCOUNTER — Other Ambulatory Visit: Payer: Self-pay | Admitting: Family Medicine

## 2016-08-09 ENCOUNTER — Encounter: Payer: 59 | Admitting: Registered Nurse

## 2016-08-15 ENCOUNTER — Telehealth: Payer: Self-pay | Admitting: Registered Nurse

## 2016-08-15 ENCOUNTER — Encounter: Payer: Self-pay | Admitting: Registered Nurse

## 2016-08-15 ENCOUNTER — Encounter: Payer: 59 | Attending: Physical Medicine & Rehabilitation | Admitting: Registered Nurse

## 2016-08-15 VITALS — BP 136/90 | HR 81

## 2016-08-15 DIAGNOSIS — Z87891 Personal history of nicotine dependence: Secondary | ICD-10-CM | POA: Insufficient documentation

## 2016-08-15 DIAGNOSIS — M1288 Other specific arthropathies, not elsewhere classified, other specified site: Secondary | ICD-10-CM | POA: Insufficient documentation

## 2016-08-15 DIAGNOSIS — Z833 Family history of diabetes mellitus: Secondary | ICD-10-CM | POA: Diagnosis not present

## 2016-08-15 DIAGNOSIS — M259 Joint disorder, unspecified: Secondary | ICD-10-CM | POA: Diagnosis not present

## 2016-08-15 DIAGNOSIS — Z8261 Family history of arthritis: Secondary | ICD-10-CM | POA: Diagnosis not present

## 2016-08-15 DIAGNOSIS — I10 Essential (primary) hypertension: Secondary | ICD-10-CM | POA: Insufficient documentation

## 2016-08-15 DIAGNOSIS — K219 Gastro-esophageal reflux disease without esophagitis: Secondary | ICD-10-CM | POA: Diagnosis not present

## 2016-08-15 DIAGNOSIS — Z5181 Encounter for therapeutic drug level monitoring: Secondary | ICD-10-CM

## 2016-08-15 DIAGNOSIS — Z79899 Other long term (current) drug therapy: Secondary | ICD-10-CM

## 2016-08-15 DIAGNOSIS — G894 Chronic pain syndrome: Secondary | ICD-10-CM | POA: Diagnosis not present

## 2016-08-15 DIAGNOSIS — G8929 Other chronic pain: Secondary | ICD-10-CM | POA: Diagnosis not present

## 2016-08-15 DIAGNOSIS — M7062 Trochanteric bursitis, left hip: Secondary | ICD-10-CM

## 2016-08-15 DIAGNOSIS — M519 Unspecified thoracic, thoracolumbar and lumbosacral intervertebral disc disorder: Secondary | ICD-10-CM

## 2016-08-15 DIAGNOSIS — E039 Hypothyroidism, unspecified: Secondary | ICD-10-CM | POA: Diagnosis not present

## 2016-08-15 DIAGNOSIS — Z8249 Family history of ischemic heart disease and other diseases of the circulatory system: Secondary | ICD-10-CM | POA: Diagnosis not present

## 2016-08-15 DIAGNOSIS — M47816 Spondylosis without myelopathy or radiculopathy, lumbar region: Secondary | ICD-10-CM

## 2016-08-15 DIAGNOSIS — F4311 Post-traumatic stress disorder, acute: Secondary | ICD-10-CM | POA: Insufficient documentation

## 2016-08-15 DIAGNOSIS — Z8619 Personal history of other infectious and parasitic diseases: Secondary | ICD-10-CM | POA: Diagnosis not present

## 2016-08-15 DIAGNOSIS — Z7901 Long term (current) use of anticoagulants: Secondary | ICD-10-CM | POA: Insufficient documentation

## 2016-08-15 MED ORDER — TAPENTADOL HCL 75 MG PO TABS: 75.0000 mg | ORAL_TABLET | Freq: Four times a day (QID) | ORAL | 0 refills | Status: DC | PRN

## 2016-08-15 NOTE — Telephone Encounter (Signed)
On 08/15/2016 the  NCCSR was reviewed no conflict was seen on the [facility] Controlled Substance Reporting System with multiple prescribers. Suzanne Villanueva has a signed narcotic contract with our office. If there were any discrepancies this would have been reported to his/her physician.

## 2016-08-15 NOTE — Progress Notes (Signed)
Subjective:    Patient ID: Suzanne Villanueva, female    DOB: 25-Sep-1980, 36 y.o.   MRN: [id number]  HPI: [name] is a 36 year old female who returns for follow up appointment and medication refill. She states her pain is located in her mid-lower back radiating into her left hip and left knee pain.  Also states she had the Flu last week.  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  Pain Inventory Average Pain 7 Pain Right Now 6 My pain is burning and aching  In the last 24 hours, has pain interfered with the following? General activity 3 Relation with others 1 Enjoyment of life 2 What TIME of day is your pain at its worst? morning and evening Sleep (in general) Good  Pain is worse with: sitting and inactivity Pain improves with: medication and TENS Relief from Meds: 8  Mobility walk without assistance  Function employed # of hrs/week 40  Neuro/Psych No problems in this area  Prior Studies Any changes since last visit?  no  Physicians involved in your care Any changes since last visit?  no   Family History  Problem Relation Age of Onset  . Heart attack Father   . Heart disease      parents  . Hypertension      parents  . Arthritis      grandparents  . Diabetes      parents   Social History   Social History  . Marital status: Married    Spouse name: N/A  . Number of children: N/A  . Years of education: N/A   Social History Main Topics  . Smoking status: Former Smoker    Packs/day: 0.50    Quit date: 01/14/2011  . Smokeless tobacco: Never Used  . Alcohol use No  . Drug use: No  . Sexual activity: Yes    Birth control/ protection: None   Other Topics Concern  . None   Social History Narrative  . None   Past Surgical History:  Procedure Laterality Date  . CESAREAN SECTION  09/14/2011   Procedure: CESAREAN SECTION;  Surgeon: Freddrick March. Tenny Craw, MD;  Location: [facility];  Service: Gynecology;  Laterality: N/A;  Primary Cesarean Section with birth  of baby girl @ 2302  . MYRINGOTOMY    . TONSILLECTOMY  1990  . TYMPANOSTOMY TUBE PLACEMENT     '90, '96, '12  . WISDOM TOOTH EXTRACTION     Past Medical History:  Diagnosis Date  . Abnormal Pap smear   . Abscess    behind the right ear  . Chicken pox   . Complication of anesthesia    Getsvery emotional  . Depression   . GERD (gastroesophageal reflux disease)   . Heart murmur   . History of CHF (congestive heart failure)    post partum 09/2011 - echo 09/21/11: EF 60-65%, mod MR, mild LAE (due to post partum fluid shifts, HTN, obesity, ?MR)  . Hypertension   . Hypothyroidism   . Migraines   . Moderate mitral regurgitation    needs echo 03/2012  . MRSA (methicillin resistant staph aureus) culture positive   . PTSD (post-traumatic stress disorder)   . Scarlet fever x 2  . Seasonal allergies   . Urinary tract infection    BP 136/90   Pulse 81   SpO2 99%   Opioid Risk Score:   Fall Risk Score:  `1  Depression screen PHQ 2/9  Depression screen PHQ  2/9 03/23/2016 09/17/2015 04/22/2015 10/08/2014 04/08/2014 03/02/2014  Decreased Interest 1 0 0 0 0 0  Down, Depressed, Hopeless 0 0 0 1 0 0  PHQ - 2 Score 1 0 0 1 0 0  Altered sleeping 2 - - - - -  Tired, decreased energy 1 - - - - -  Change in appetite 0 - - - - -  Feeling bad or failure about yourself  0 - - - - -  Trouble concentrating 0 - - - - -  Moving slowly or fidgety/restless 0 - - - - -  Suicidal thoughts 0 - - - - -  PHQ-9 Score 4 - - - - -  Difficult doing work/chores Not difficult at all - - - - -    Review of Systems  Constitutional: Negative.   HENT: Negative.   Eyes: Negative.   Respiratory: Negative.   Cardiovascular: Negative.   Gastrointestinal: Negative.   Endocrine: Negative.   Genitourinary: Negative.   Musculoskeletal: Negative.   Skin: Negative.   Allergic/Immunologic: Negative.   Neurological: Negative.   Hematological: Negative.   Psychiatric/Behavioral: Negative.        Objective:    Physical Exam  Constitutional: She is oriented to person, place, and time. She appears well-developed and well-nourished.  HENT:  Head: Normocephalic and atraumatic.  Neck: Normal range of motion. Neck supple.  Cardiovascular: Normal rate and regular rhythm.   Pulmonary/Chest: Effort normal and breath sounds normal.  Musculoskeletal:  Normal Muscle Bulk and Muscle Testing Reveals: Upper Extremities: Full ROM and Muscle Strength 5/5 Lumbar Paraspinal Tenderness: L-3-L-5 Sacral Tenderness Left Greater Trochanteric Tenderness Lower Extremities: Full ROM and Muscle Strength 5/5 Arises from Table with ease Narrow Based Gait  Neurological: She is alert and oriented to person, place, and time.  Skin: Skin is warm and dry.  Psychiatric: She has a normal mood and affect.  Nursing note and vitals reviewed.         Assessment & Plan:  1. Thoracic degenerative disc as well as lumbar spondylosis.08/15/2016 Refilled Nucynta 75 mg one tablet every 6 hours as needed. We will continue the opioid monitoring program, this consists of regular clinic visits, examinations, urine drug screen, pill counts as well as use of [place] Controlled Substance Reporting System. 2. Left Greater Trochanteric Tenderness: Continue with HEP and Heat and Ice Therapy.  20 minutes of face to face patient care time was spent during this visit. All questions were encouraged and answered.  F/U in 1 month

## 2016-08-20 LAB — TOXASSURE SELECT,+ANTIDEPR,UR

## 2016-08-29 ENCOUNTER — Encounter: Payer: 59 | Admitting: Family Medicine

## 2016-09-05 ENCOUNTER — Other Ambulatory Visit: Payer: Self-pay | Admitting: Family Medicine

## 2016-09-06 ENCOUNTER — Encounter: Payer: Self-pay | Admitting: Registered Nurse

## 2016-09-06 ENCOUNTER — Encounter: Payer: 59 | Attending: Physical Medicine & Rehabilitation | Admitting: Registered Nurse

## 2016-09-06 VITALS — BP 143/86 | HR 76 | Resp 14

## 2016-09-06 DIAGNOSIS — Z8261 Family history of arthritis: Secondary | ICD-10-CM | POA: Diagnosis not present

## 2016-09-06 DIAGNOSIS — Z8619 Personal history of other infectious and parasitic diseases: Secondary | ICD-10-CM | POA: Diagnosis not present

## 2016-09-06 DIAGNOSIS — Z79899 Other long term (current) drug therapy: Secondary | ICD-10-CM | POA: Diagnosis not present

## 2016-09-06 DIAGNOSIS — M1288 Other specific arthropathies, not elsewhere classified, other specified site: Secondary | ICD-10-CM | POA: Diagnosis not present

## 2016-09-06 DIAGNOSIS — Z8249 Family history of ischemic heart disease and other diseases of the circulatory system: Secondary | ICD-10-CM | POA: Diagnosis not present

## 2016-09-06 DIAGNOSIS — G8929 Other chronic pain: Secondary | ICD-10-CM | POA: Insufficient documentation

## 2016-09-06 DIAGNOSIS — M47816 Spondylosis without myelopathy or radiculopathy, lumbar region: Secondary | ICD-10-CM

## 2016-09-06 DIAGNOSIS — F4311 Post-traumatic stress disorder, acute: Secondary | ICD-10-CM | POA: Diagnosis not present

## 2016-09-06 DIAGNOSIS — Z7901 Long term (current) use of anticoagulants: Secondary | ICD-10-CM | POA: Insufficient documentation

## 2016-09-06 DIAGNOSIS — K219 Gastro-esophageal reflux disease without esophagitis: Secondary | ICD-10-CM | POA: Insufficient documentation

## 2016-09-06 DIAGNOSIS — M519 Unspecified thoracic, thoracolumbar and lumbosacral intervertebral disc disorder: Secondary | ICD-10-CM

## 2016-09-06 DIAGNOSIS — Z5181 Encounter for therapeutic drug level monitoring: Secondary | ICD-10-CM

## 2016-09-06 DIAGNOSIS — M259 Joint disorder, unspecified: Secondary | ICD-10-CM | POA: Insufficient documentation

## 2016-09-06 DIAGNOSIS — Z833 Family history of diabetes mellitus: Secondary | ICD-10-CM | POA: Insufficient documentation

## 2016-09-06 DIAGNOSIS — M7062 Trochanteric bursitis, left hip: Secondary | ICD-10-CM

## 2016-09-06 DIAGNOSIS — I10 Essential (primary) hypertension: Secondary | ICD-10-CM | POA: Diagnosis not present

## 2016-09-06 DIAGNOSIS — E039 Hypothyroidism, unspecified: Secondary | ICD-10-CM | POA: Diagnosis not present

## 2016-09-06 DIAGNOSIS — G894 Chronic pain syndrome: Secondary | ICD-10-CM

## 2016-09-06 DIAGNOSIS — Z87891 Personal history of nicotine dependence: Secondary | ICD-10-CM | POA: Diagnosis not present

## 2016-09-06 MED ORDER — TAPENTADOL HCL 75 MG PO TABS: 75.0000 mg | ORAL_TABLET | Freq: Four times a day (QID) | ORAL | 0 refills | Status: DC | PRN

## 2016-09-06 NOTE — Progress Notes (Signed)
Subjective:    Patient ID: Suzanne Villanueva, female    DOB: April 06, 1981, 36 y.o.   MRN: [id number]  HPI: [name] is a 36 year old female who returns for follow up appointment and medication refill. She states her pain is located in her mid-lower back radiating into her left hip. Her current exercise regime is walking.  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  Pain Inventory Average Pain 5 Pain Right Now 3 My pain is burning, dull and aching  In the last 24 hours, has pain interfered with the following? General activity 4 Relation with others 0 Enjoyment of life 0 What TIME of day is your pain at its worst? daytime, evening Sleep (in general) Fair  Pain is worse with: inactivity and some activites Pain improves with: medication Relief from Meds: 5  Mobility walk without assistance how many minutes can you walk? as needed Do you have any goals in this area?  no  Function employed # of hrs/week . Do you have any goals in this area?  no  Neuro/Psych No problems in this area  Prior Studies Any changes since last visit?  no  Physicians involved in your care Any changes since last visit?  no   Family History  Problem Relation Age of Onset  . Heart attack Father   . Heart disease      parents  . Hypertension      parents  . Arthritis      grandparents  . Diabetes      parents   Social History   Social History  . Marital status: Married    Spouse name: N/A  . Number of children: N/A  . Years of education: N/A   Social History Main Topics  . Smoking status: Former Smoker    Packs/day: 0.50    Quit date: 01/14/2011  . Smokeless tobacco: Never Used  . Alcohol use No  . Drug use: No  . Sexual activity: Yes    Birth control/ protection: None   Other Topics Concern  . Not on file   Social History Narrative  . No narrative on file   Past Surgical History:  Procedure Laterality Date  . CESAREAN SECTION  09/14/2011   Procedure: CESAREAN  SECTION;  Surgeon: Freddrick March. Tenny Craw, MD;  Location: [facility];  Service: Gynecology;  Laterality: N/A;  Primary Cesarean Section with birth of baby girl @ 2302  . MYRINGOTOMY    . TONSILLECTOMY  1990  . TYMPANOSTOMY TUBE PLACEMENT     '90, '96, '12  . WISDOM TOOTH EXTRACTION     Past Medical History:  Diagnosis Date  . Abnormal Pap smear   . Abscess    behind the right ear  . Chicken pox   . Complication of anesthesia    Getsvery emotional  . Depression   . GERD (gastroesophageal reflux disease)   . Heart murmur   . History of CHF (congestive heart failure)    post partum 09/2011 - echo 09/21/11: EF 60-65%, mod MR, mild LAE (due to post partum fluid shifts, HTN, obesity, ?MR)  . Hypertension   . Hypothyroidism   . Migraines   . Moderate mitral regurgitation    needs echo 03/2012  . MRSA (methicillin resistant staph aureus) culture positive   . PTSD (post-traumatic stress disorder)   . Scarlet fever x 2  . Seasonal allergies   . Urinary tract infection    There were no vitals taken for  this visit.  Opioid Risk Score:   Fall Risk Score:  `1  Depression screen PHQ 2/9  Depression screen [facility] 2/9 03/23/2016 09/17/2015 04/22/2015 10/08/2014 04/08/2014 03/02/2014  Decreased Interest 1 0 0 0 0 0  Down, Depressed, Hopeless 0 0 0 1 0 0  PHQ - 2 Score 1 0 0 1 0 0  Altered sleeping 2 - - - - -  Tired, decreased energy 1 - - - - -  Change in appetite 0 - - - - -  Feeling bad or failure about yourself  0 - - - - -  Trouble concentrating 0 - - - - -  Moving slowly or fidgety/restless 0 - - - - -  Suicidal thoughts 0 - - - - -  PHQ-9 Score 4 - - - - -  Difficult doing work/chores Not difficult at all - - - - -    Review of Systems  Constitutional: Negative.   HENT: Negative.   Eyes: Negative.   Respiratory: Negative.   Cardiovascular: Negative.   Gastrointestinal: Negative.   Endocrine: Negative.   Genitourinary: Negative.   Musculoskeletal: Positive for back pain.  Skin: Negative.    Allergic/Immunologic: Negative.   Neurological: Negative.   Hematological: Negative.   Psychiatric/Behavioral: Negative.   All other systems reviewed and are negative.      Objective:   Physical Exam  Constitutional: She is oriented to person, place, and time. She appears well-developed and well-nourished.  HENT:  Head: Normocephalic and atraumatic.  Neck: Normal range of motion. Neck supple.  Cardiovascular: Normal rate and regular rhythm.   Pulmonary/Chest: Effort normal and breath sounds normal.  Musculoskeletal:  Normal Muscle Bulk and Muscle Testing Reveals: Upper Extremities: Full ROM and Muscle Strength 5/5 Lumbar Paraspinal Tenderness: L-4-L-5 Left Greater Trochanteric Tenderness Lower Extremities: Full ROM and Muscle Strength 5/5 Arises from Table with ease Narrow Based Gait  Neurological: She is alert and oriented to person, place, and time.  Skin: Skin is warm and dry.  Psychiatric: She has a normal mood and affect.  Nursing note and vitals reviewed.         Assessment & Plan:  1. Thoracic degenerative disc as well as lumbar spondylosis.09/06/2016 Refilled Nucynta 75 mg one tablet every 6 hours as needed. #120 We will continue the opioid monitoring program, this consists of regular clinic visits, examinations, urine drug screen, pill counts as well as use of [place] Controlled Substance Reporting System. 2. Left Greater Trochanteric Tenderness: Continue with HEP and Heat and Ice Therapy. 09/06/2016  20 minutes of face to face patient care time was spent during this visit. All questions were encouraged and answered.   F/U in 1 month

## 2016-09-07 ENCOUNTER — Encounter: Payer: Self-pay | Admitting: Family Medicine

## 2016-09-07 ENCOUNTER — Ambulatory Visit (INDEPENDENT_AMBULATORY_CARE_PROVIDER_SITE_OTHER): Payer: 59 | Admitting: Family Medicine

## 2016-09-07 VITALS — BP 121/82 | HR 76 | Temp 98.5°F | Resp 16 | Ht 67.0 in | Wt 285.1 lb

## 2016-09-07 DIAGNOSIS — Z0000 Encounter for general adult medical examination without abnormal findings: Secondary | ICD-10-CM

## 2016-09-07 LAB — VITAMIN D 25 HYDROXY (VIT D DEFICIENCY, FRACTURES): VITD: 19.96 ng/mL — ABNORMAL LOW (ref 30.00–100.00)

## 2016-09-07 LAB — HEPATIC FUNCTION PANEL
ALBUMIN: 4 g/dL (ref 3.5–5.2)
ALK PHOS: 43 U/L (ref 39–117)
ALT: 13 U/L (ref 0–35)
AST: 13 U/L (ref 0–37)
BILIRUBIN DIRECT: 0.2 mg/dL (ref 0.0–0.3)
BILIRUBIN TOTAL: 1 mg/dL (ref 0.2–1.2)
Total Protein: 7 g/dL (ref 6.0–8.3)

## 2016-09-07 LAB — BASIC METABOLIC PANEL
BUN: 13 mg/dL (ref 6–23)
CHLORIDE: 105 meq/L (ref 96–112)
CO2: 29 mEq/L (ref 19–32)
Calcium: 9.1 mg/dL (ref 8.4–10.5)
Creatinine, Ser: 0.88 mg/dL (ref 0.40–1.20)
GFR: 78.38 mL/min (ref >60.00)
Glucose, Bld: 89 mg/dL (ref 70–99)
POTASSIUM: 3.9 meq/L (ref 3.5–5.1)
SODIUM: 140 meq/L (ref 135–145)

## 2016-09-07 LAB — LIPID PANEL
CHOL/HDL RATIO: 4
Cholesterol: 155 mg/dL (ref 0–200)
HDL: 39.9 mg/dL (ref >39.00)
LDL CALC: 87 mg/dL (ref 0–99)
NONHDL: 115.26
TRIGLYCERIDES: 140 mg/dL (ref 0.0–149.0)
VLDL: 28 mg/dL (ref 0.0–40.0)

## 2016-09-07 LAB — CBC WITH DIFFERENTIAL/PLATELET
Basophils Absolute: 0 10*3/uL (ref 0.0–0.1)
Basophils Relative: 0.4 % (ref 0.0–3.0)
EOS ABS: 0 10*3/uL (ref 0.0–0.7)
EOS PCT: 0.8 % (ref 0.0–5.0)
HCT: 33.8 % — ABNORMAL LOW (ref 36.0–46.0)
Hemoglobin: 11.4 g/dL — ABNORMAL LOW (ref 12.0–15.0)
LYMPHS PCT: 28.3 % (ref 12.0–46.0)
Lymphs Abs: 1.4 10*3/uL (ref 0.7–4.0)
MCHC: 33.6 g/dL (ref 30.0–36.0)
MCV: 89.5 fl (ref 78.0–100.0)
MONO ABS: 0.4 10*3/uL (ref 0.1–1.0)
Monocytes Relative: 7.8 % (ref 3.0–12.0)
NEUTROS ABS: 3.2 10*3/uL (ref 1.4–7.7)
NEUTROS PCT: 62.7 % (ref 43.0–77.0)
Platelets: 227 10*3/uL (ref 150.0–400.0)
RBC: 3.78 Mil/uL — AB (ref 3.87–5.11)
RDW: 14 % (ref 11.5–15.5)
WBC: 5.1 10*3/uL (ref 4.0–10.5)

## 2016-09-07 LAB — TSH: TSH: 2.91 u[IU]/mL (ref 0.35–4.50)

## 2016-09-07 NOTE — Patient Instructions (Signed)
Follow up in 6 months to recheck BP We'll notify you of your lab results and make any changes if needed Keep up the good work on healthy diet and regular exercise- you're doing great! Call with any questions or concerns [place]!!

## 2016-09-07 NOTE — Progress Notes (Signed)
Pre visit review using our clinic review tool, if applicable. No additional management support is needed unless otherwise documented below in the visit note. 

## 2016-09-07 NOTE — Assessment & Plan Note (Signed)
Pt's PE WNL w/ exception of obesity.  Check labs.  Applauded her recent weight loss efforts.  Anticipatory guidance provided.

## 2016-09-07 NOTE — Progress Notes (Signed)
   Subjective:    Patient ID: Suzanne Villanueva, female    DOB: 10/27/1980, 36 y.o.   MRN: [id number]  HPI CPE- UTD on GYN, Tdap.  Has lost 11 lbs!   Review of Systems Patient reports no vision/ hearing changes, adenopathy,fever, weight change,  persistant/recurrent hoarseness , swallowing issues, chest pain, palpitations, edema, persistant/recurrent cough, hemoptysis, dyspnea (rest/exertional/paroxysmal nocturnal), gastrointestinal bleeding (melena, rectal bleeding), abdominal pain, significant heartburn, bowel changes, GU symptoms (dysuria, hematuria, incontinence), Gyn symptoms (abnormal  bleeding, pain),  syncope, focal weakness, memory loss, numbness & tingling, skin/hair/nail changes, abnormal bruising or bleeding, anxiety, or depression.     Objective:   Physical Exam General Appearance:    Alert, cooperative, no distress, appears stated age, obese  Head:    Normocephalic, without obvious abnormality, atraumatic  Eyes:    PERRL, conjunctiva/corneas clear, EOM's intact, fundi    benign, both eyes  Ears:    Normal TM's and external ear canals, both ears  Nose:   Nares normal, septum midline, mucosa normal, no drainage    or sinus tenderness  Throat:   Lips, mucosa, and tongue normal; teeth and gums normal  Neck:   Supple, symmetrical, trachea midline, no adenopathy;    Thyroid: no enlargement/tenderness/nodules  Back:     Symmetric, no curvature, ROM normal, no CVA tenderness  Lungs:     Clear to auscultation bilaterally, respirations unlabored  Chest Wall:    No tenderness or deformity   Heart:    Regular rate and rhythm, S1 and S2 normal, no murmur, rub   or gallop  Breast Exam:    Deferred to GYN  Abdomen:     Soft, non-tender, bowel sounds active all four quadrants,    no masses, no organomegaly  Genitalia:    Deferred to GYN  Rectal:    Extremities:   Extremities normal, atraumatic, no cyanosis or edema  Pulses:   2+ and symmetric all extremities  Skin:   Skin color,  texture, turgor normal, no rashes or lesions  Lymph nodes:   Cervical, supraclavicular, and axillary nodes normal  Neurologic:   CNII-XII intact, normal strength, sensation and reflexes    throughout          Assessment & Plan:

## 2016-09-08 ENCOUNTER — Other Ambulatory Visit: Payer: Self-pay | Admitting: General Practice

## 2016-09-08 MED ORDER — VITAMIN D (ERGOCALCIFEROL) 1.25 MG (50000 UNIT) PO CAPS: 50000.0000 [IU] | ORAL_CAPSULE | ORAL | 0 refills | Status: DC

## 2016-09-11 ENCOUNTER — Encounter: Payer: Self-pay | Admitting: Registered Nurse

## 2016-10-04 ENCOUNTER — Encounter: Payer: 59 | Admitting: Registered Nurse

## 2016-10-05 ENCOUNTER — Ambulatory Visit: Payer: 59 | Admitting: Registered Nurse

## 2016-10-13 ENCOUNTER — Encounter: Payer: Self-pay | Admitting: Registered Nurse

## 2016-10-13 ENCOUNTER — Encounter: Payer: 59 | Attending: Physical Medicine & Rehabilitation | Admitting: Registered Nurse

## 2016-10-13 VITALS — BP 130/86 | HR 79

## 2016-10-13 DIAGNOSIS — G8929 Other chronic pain: Secondary | ICD-10-CM | POA: Diagnosis not present

## 2016-10-13 DIAGNOSIS — Z833 Family history of diabetes mellitus: Secondary | ICD-10-CM | POA: Insufficient documentation

## 2016-10-13 DIAGNOSIS — M519 Unspecified thoracic, thoracolumbar and lumbosacral intervertebral disc disorder: Secondary | ICD-10-CM | POA: Diagnosis not present

## 2016-10-13 DIAGNOSIS — G894 Chronic pain syndrome: Secondary | ICD-10-CM

## 2016-10-13 DIAGNOSIS — Z8619 Personal history of other infectious and parasitic diseases: Secondary | ICD-10-CM | POA: Diagnosis not present

## 2016-10-13 DIAGNOSIS — K219 Gastro-esophageal reflux disease without esophagitis: Secondary | ICD-10-CM | POA: Insufficient documentation

## 2016-10-13 DIAGNOSIS — Z7901 Long term (current) use of anticoagulants: Secondary | ICD-10-CM | POA: Insufficient documentation

## 2016-10-13 DIAGNOSIS — M1288 Other specific arthropathies, not elsewhere classified, other specified site: Secondary | ICD-10-CM | POA: Diagnosis not present

## 2016-10-13 DIAGNOSIS — E039 Hypothyroidism, unspecified: Secondary | ICD-10-CM | POA: Insufficient documentation

## 2016-10-13 DIAGNOSIS — M7062 Trochanteric bursitis, left hip: Secondary | ICD-10-CM

## 2016-10-13 DIAGNOSIS — Z79899 Other long term (current) drug therapy: Secondary | ICD-10-CM | POA: Diagnosis not present

## 2016-10-13 DIAGNOSIS — F4311 Post-traumatic stress disorder, acute: Secondary | ICD-10-CM | POA: Insufficient documentation

## 2016-10-13 DIAGNOSIS — I10 Essential (primary) hypertension: Secondary | ICD-10-CM | POA: Diagnosis not present

## 2016-10-13 DIAGNOSIS — Z87891 Personal history of nicotine dependence: Secondary | ICD-10-CM | POA: Diagnosis not present

## 2016-10-13 DIAGNOSIS — Z8249 Family history of ischemic heart disease and other diseases of the circulatory system: Secondary | ICD-10-CM | POA: Insufficient documentation

## 2016-10-13 DIAGNOSIS — M259 Joint disorder, unspecified: Secondary | ICD-10-CM | POA: Insufficient documentation

## 2016-10-13 DIAGNOSIS — Z5181 Encounter for therapeutic drug level monitoring: Secondary | ICD-10-CM | POA: Diagnosis not present

## 2016-10-13 DIAGNOSIS — M47816 Spondylosis without myelopathy or radiculopathy, lumbar region: Secondary | ICD-10-CM

## 2016-10-13 DIAGNOSIS — Z8261 Family history of arthritis: Secondary | ICD-10-CM | POA: Diagnosis not present

## 2016-10-13 MED ORDER — TAPENTADOL HCL 75 MG PO TABS: 75.0000 mg | ORAL_TABLET | Freq: Four times a day (QID) | ORAL | 0 refills | Status: DC | PRN

## 2016-10-13 NOTE — Progress Notes (Signed)
Subjective:    Patient ID: Suzanne Villanueva, female    DOB: 13-Apr-1981, 36 y.o.   MRN: [id number]  HPI: Suzanne Villanueva is a 36 year old female who returns for follow up appointment and medication refill. She states her pain is located in her mid-lower back radiating into her lefthip. Her current exercise regime is walking.  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  Last UDS was on 08/15/2016, she was prescribed Tramadol.    Pain Inventory Average Pain 7 Pain Right Now 7 My pain is sharp, burning and aching  In the last 24 hours, has pain interfered with the following? General activity 5 Relation with others 3 Enjoyment of life 5 What TIME of day is your pain at its worst? daytime Sleep (in general) Fair  Pain is worse with: inactivity and some activites Pain improves with: therapy/exercise, medication and TENS Relief from Meds: 3  Mobility Do you have any goals in this area?  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  no  Neuro/Psych No problems in this area  Prior Studies Any changes since last visit?  no  Physicians involved in your care Any changes since last visit?  no   Family History  Problem Relation Age of Onset  . Heart attack Father   . Heart disease      parents  . Hypertension      parents  . Arthritis      grandparents  . Diabetes      parents   Social History   Social History  . Marital status: Married    Spouse name: N/A  . Number of children: N/A  . Years of education: N/A   Social History Main Topics  . Smoking status: Former Smoker    Packs/day: 0.50    Quit date: 01/14/2011  . Smokeless tobacco: Never Used  . Alcohol use No  . Drug use: No  . Sexual activity: Yes    Birth control/ protection: None   Other Topics Concern  . None   Social History Narrative  . None   Past Surgical History:  Procedure Laterality Date  . CESAREAN SECTION  09/14/2011   Procedure: CESAREAN SECTION;  Surgeon: Freddrick March. Tenny Craw, MD;  Location: [facility];  Service: Gynecology;  Laterality: N/A;  Primary Cesarean Section with birth of baby girl @ 2302  . MYRINGOTOMY    . TONSILLECTOMY  1990  . TYMPANOSTOMY TUBE PLACEMENT     '90, '96, '12  . WISDOM TOOTH EXTRACTION     Past Medical History:  Diagnosis Date  . Abnormal Pap smear   . Abscess    behind the right ear  . Chicken pox   . Complication of anesthesia    Getsvery emotional  . Depression   . GERD (gastroesophageal reflux disease)   . Heart murmur   . History of CHF (congestive heart failure)    post partum 09/2011 - echo 09/21/11: EF 60-65%, mod MR, mild LAE (due to post partum fluid shifts, HTN, obesity, ?MR)  . Hypertension   . Hypothyroidism   . Migraines   . Moderate mitral regurgitation    needs echo 03/2012  . MRSA (methicillin resistant staph aureus) culture positive   . PTSD (post-traumatic stress disorder)   . Scarlet fever x 2  . Seasonal allergies   . Urinary tract infection    BP 130/86 (BP Location: Left  Arm, Patient Position: Sitting, Cuff Size: Large)   Pulse 79   SpO2 98%   Opioid Risk Score:   Fall Risk Score:  `1  Depression screen PHQ 2/9  Depression screen [facility] 2/9 09/07/2016 03/23/2016 09/17/2015 04/22/2015 10/08/2014 04/08/2014 03/02/2014  Decreased Interest 0 1 0 0 0 0 0  Down, Depressed, Hopeless 0 0 0 0 1 0 0  PHQ - 2 Score 0 1 0 0 1 0 0  Altered sleeping 0 2 - - - - -  Tired, decreased energy 0 1 - - - - -  Change in appetite 0 0 - - - - -  Feeling bad or failure about yourself  0 0 - - - - -  Trouble concentrating 0 0 - - - - -  Moving slowly or fidgety/restless 0 0 - - - - -  Suicidal thoughts 0 0 - - - - -  PHQ-9 Score 0 4 - - - - -  Difficult doing work/chores - Not difficult at all - - - - -   Review of Systems  Constitutional: Negative.   HENT: Negative.   Eyes: Negative.   Respiratory: Negative.   Cardiovascular: Negative.   Gastrointestinal: Negative.     Endocrine: Negative.   Genitourinary: Negative.   Musculoskeletal: Negative.   Skin: Negative.   Allergic/Immunologic: Negative.   Neurological: Negative.   Hematological: Negative.   Psychiatric/Behavioral: Negative.   All other systems reviewed and are negative.      Objective:   Physical Exam  Constitutional: She is oriented to person, place, and time. She appears well-developed and well-nourished.  HENT:  Head: Normocephalic and atraumatic.  Neck: Normal range of motion. Neck supple.  Cardiovascular: Normal rate and regular rhythm.   Pulmonary/Chest: Effort normal and breath sounds normal.  Musculoskeletal:  Normal Muscle Bulk and Muscle Testing Reveals: Upper Extremities: Full ROM and Muscle Strength 5/5 Lumbar Paraspinal Tenderness: L-3-L-5 Lower Extremities: Full ROM and Muscle Strength 5/5 Arises from Table with ease Narrow Based Gait  Neurological: She is alert and oriented to person, place, and time.  Skin: Skin is warm and dry.  Psychiatric: She has a normal mood and affect.  Nursing note and vitals reviewed.         Assessment & Plan:  1. Thoracic degenerative disc as well as lumbar spondylosis.10/13/2016 Refilled Nucynta 75 mg one tablet every 6 hours as needed. #120 We will continue the opioid monitoring program, this consists of regular clinic visits, examinations, urine drug screen, pill counts as well as use of [place] Controlled Substance Reporting System. 2. Left Greater Trochanteric Tenderness: Continue with HEP and Heat and Ice Therapy. 10/13/2016  20 minutes of face to face patient care time was spent during this visit. All questions were encouraged and answered.    F/U in 1 month

## 2016-11-02 ENCOUNTER — Telehealth: Payer: Self-pay | Admitting: Registered Nurse

## 2016-11-02 NOTE — Telephone Encounter (Signed)
On 11/02/2016 the NCCSR was reviewed no conflict was seen on the [facility] Reporting System with multiple prescribers. there were any discrepancies this would have been reported to her physician.

## 2016-11-06 ENCOUNTER — Encounter: Payer: Self-pay | Admitting: Registered Nurse

## 2016-11-06 ENCOUNTER — Encounter: Payer: 59 | Attending: Physical Medicine & Rehabilitation | Admitting: Registered Nurse

## 2016-11-06 VITALS — BP 126/83 | HR 84

## 2016-11-06 DIAGNOSIS — E039 Hypothyroidism, unspecified: Secondary | ICD-10-CM | POA: Diagnosis not present

## 2016-11-06 DIAGNOSIS — M519 Unspecified thoracic, thoracolumbar and lumbosacral intervertebral disc disorder: Secondary | ICD-10-CM | POA: Diagnosis present

## 2016-11-06 DIAGNOSIS — Z5181 Encounter for therapeutic drug level monitoring: Secondary | ICD-10-CM | POA: Diagnosis not present

## 2016-11-06 DIAGNOSIS — M259 Joint disorder, unspecified: Secondary | ICD-10-CM | POA: Insufficient documentation

## 2016-11-06 DIAGNOSIS — Z8619 Personal history of other infectious and parasitic diseases: Secondary | ICD-10-CM | POA: Insufficient documentation

## 2016-11-06 DIAGNOSIS — Z87891 Personal history of nicotine dependence: Secondary | ICD-10-CM | POA: Insufficient documentation

## 2016-11-06 DIAGNOSIS — G894 Chronic pain syndrome: Secondary | ICD-10-CM | POA: Diagnosis not present

## 2016-11-06 DIAGNOSIS — M1288 Other specific arthropathies, not elsewhere classified, other specified site: Secondary | ICD-10-CM | POA: Insufficient documentation

## 2016-11-06 DIAGNOSIS — M47816 Spondylosis without myelopathy or radiculopathy, lumbar region: Secondary | ICD-10-CM

## 2016-11-06 DIAGNOSIS — Z8261 Family history of arthritis: Secondary | ICD-10-CM | POA: Insufficient documentation

## 2016-11-06 DIAGNOSIS — Z7901 Long term (current) use of anticoagulants: Secondary | ICD-10-CM | POA: Diagnosis not present

## 2016-11-06 DIAGNOSIS — Z79899 Other long term (current) drug therapy: Secondary | ICD-10-CM | POA: Diagnosis not present

## 2016-11-06 DIAGNOSIS — Z833 Family history of diabetes mellitus: Secondary | ICD-10-CM | POA: Diagnosis not present

## 2016-11-06 DIAGNOSIS — Z8249 Family history of ischemic heart disease and other diseases of the circulatory system: Secondary | ICD-10-CM | POA: Insufficient documentation

## 2016-11-06 DIAGNOSIS — F4311 Post-traumatic stress disorder, acute: Secondary | ICD-10-CM | POA: Diagnosis not present

## 2016-11-06 DIAGNOSIS — M4696 Unspecified inflammatory spondylopathy, lumbar region: Secondary | ICD-10-CM | POA: Diagnosis not present

## 2016-11-06 DIAGNOSIS — I10 Essential (primary) hypertension: Secondary | ICD-10-CM | POA: Insufficient documentation

## 2016-11-06 DIAGNOSIS — K219 Gastro-esophageal reflux disease without esophagitis: Secondary | ICD-10-CM | POA: Diagnosis not present

## 2016-11-06 DIAGNOSIS — G8929 Other chronic pain: Secondary | ICD-10-CM | POA: Diagnosis not present

## 2016-11-06 MED ORDER — TAPENTADOL HCL 75 MG PO TABS: 75.0000 mg | ORAL_TABLET | Freq: Four times a day (QID) | ORAL | 0 refills | Status: DC | PRN

## 2016-11-06 NOTE — Progress Notes (Signed)
Subjective:     Patient ID: Suzanne Villanueva, female   DOB: Aug 22, 1980, 36 y.o.   MRN: [id number]  HPI: [name] is a 36 year old female who returns for follow up appointment and medication refill. She states her pain is located in her lower back. She rates her pain 4. Her current exercise regime is walking, performing stretching and cardio exercises.  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  Pain Inventory Average Pain 6 Pain Right Now 4 My pain is sharp, burning and aching  In the last 24 hours, has pain interfered with the following? General activity 4 Relation with others 2 Enjoyment of life 4 What TIME of day is your pain at its worst? no selection Sleep (in general) NA  Pain is worse with: sitting and inactivity Pain improves with: medication Relief from Meds: 5  Mobility walk without assistance do you drive?  yes Do you have any goals in this area?  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  no  Neuro/Psych No problems in this area  Prior Studies Any changes since last visit?  no  Physicians involved in your care Any changes since last visit?  no   Family History  Problem Relation Age of Onset  . Heart attack Father   . Heart disease Unknown        parents  . Hypertension Unknown        parents  . Arthritis Unknown        grandparents  . Diabetes Unknown        parents   Social History   Social History  . Marital status: Married    Spouse name: N/A  . Number of children: N/A  . Years of education: N/A   Social History Main Topics  . Smoking status: Former Smoker    Packs/day: 0.50    Quit date: 01/14/2011  . Smokeless tobacco: Never Used  . Alcohol use No  . Drug use: No  . Sexual activity: Yes    Birth control/ protection: None   Other Topics Concern  . None   Social History Narrative  . None   Past Surgical History:  Procedure Laterality Date  . CESAREAN  SECTION  09/14/2011   Procedure: CESAREAN SECTION;  Surgeon: Freddrick March. Tenny Craw, MD;  Location: [facility];  Service: Gynecology;  Laterality: N/A;  Primary Cesarean Section with birth of baby girl @ 2302  . MYRINGOTOMY    . TONSILLECTOMY  1990  . TYMPANOSTOMY TUBE PLACEMENT     '90, '96, '12  . WISDOM TOOTH EXTRACTION     Past Medical History:  Diagnosis Date  . Abnormal Pap smear   . Abscess    behind the right ear  . Chicken pox   . Complication of anesthesia    Getsvery emotional  . Depression   . GERD (gastroesophageal reflux disease)   . Heart murmur   . History of CHF (congestive heart failure)    post partum 09/2011 - echo 09/21/11: EF 60-65%, mod MR, mild LAE (due to post partum fluid shifts, HTN, obesity, ?MR)  . Hypertension   . Hypothyroidism   . Migraines   . Moderate mitral regurgitation    needs echo 03/2012  . MRSA (methicillin resistant staph aureus) culture positive   . PTSD (post-traumatic stress disorder)   . Scarlet fever x 2  . Seasonal allergies   .  Urinary tract infection    BP 126/83 (BP Location: Left Arm, Patient Position: Sitting, Cuff Size: Large)   Pulse 84   SpO2 98%   Opioid Risk Score:   Fall Risk Score:  `1  Depression screen PHQ 2/9  Depression screen [facility] 2/9 09/07/2016 03/23/2016 09/17/2015 04/22/2015 10/08/2014 04/08/2014 03/02/2014  Decreased Interest 0 1 0 0 0 0 0  Down, Depressed, Hopeless 0 0 0 0 1 0 0  PHQ - 2 Score 0 1 0 0 1 0 0  Altered sleeping 0 2 - - - - -  Tired, decreased energy 0 1 - - - - -  Change in appetite 0 0 - - - - -  Feeling bad or failure about yourself  0 0 - - - - -  Trouble concentrating 0 0 - - - - -  Moving slowly or fidgety/restless 0 0 - - - - -  Suicidal thoughts 0 0 - - - - -  PHQ-9 Score 0 4 - - - - -  Difficult doing work/chores - Not difficult at all - - - - -    Review of Systems  Constitutional: Negative.   HENT: Negative.   Eyes: Negative.   Respiratory: Negative.   Cardiovascular: Negative.    Gastrointestinal: Negative.   Endocrine: Negative.   Genitourinary: Negative.   Musculoskeletal: Positive for arthralgias and back pain.  Skin: Negative.   Allergic/Immunologic: Negative.   Neurological: Negative.   Hematological: Negative.   Psychiatric/Behavioral: Negative.   All other systems reviewed and are negative.      Objective:   Physical Exam  Constitutional: She is oriented to person, place, and time. She appears well-developed.  HENT:  Head: Normocephalic and atraumatic.  Neck: Normal range of motion. Neck supple.  Cardiovascular: Normal rate and regular rhythm.   Pulmonary/Chest: Effort normal and breath sounds normal.  Musculoskeletal:  Normal Muscle Bulk and Muscle Testing Reveals: Upper Extremities: Full ROM and Muscle Strength 5/5 Lumbar Paraspinal Tenderness: L-3-L-5 Lower Extremities: Full ROM and Muscle Strength 5/5 Arises from Table with ease Narrow Based Gait  Neurological: She is alert and oriented to person, place, and time.  Skin: Skin is warm and dry.  Psychiatric: She has a normal mood and affect.  Nursing note and vitals reviewed.      Assessment:     1. Thoracic degenerative disc as well as lumbar spondylosis.11/06/2016 Refilled Nucynta 75 mg one tablet every 6 hours as needed. #120 We will continue the opioid monitoring program, this consists of regular clinic visits, examinations, urine drug screen, pill counts as well as use of [place] Controlled Substance Reporting System. 2. Left Greater Trochanteric Tenderness: No complaints today. Continue with HEP and Heat and Ice Therapy. 11/06/2016  20 minutes of face to face patient care time was spent during this visit. All questions were encouraged and answered.   F/U in 1 month

## 2016-12-04 ENCOUNTER — Ambulatory Visit: Payer: 59 | Admitting: Physical Medicine & Rehabilitation

## 2016-12-04 ENCOUNTER — Other Ambulatory Visit: Payer: Self-pay | Admitting: Family Medicine

## 2016-12-06 ENCOUNTER — Encounter: Payer: Self-pay | Admitting: Family Medicine

## 2016-12-06 ENCOUNTER — Other Ambulatory Visit: Payer: Self-pay | Admitting: Family Medicine

## 2016-12-06 MED ORDER — PANTOPRAZOLE SODIUM 40 MG PO TBEC: 40.0000 mg | DELAYED_RELEASE_TABLET | Freq: Two times a day (BID) | ORAL | 6 refills | Status: DC

## 2016-12-28 ENCOUNTER — Encounter: Payer: 59 | Attending: Physical Medicine & Rehabilitation | Admitting: Registered Nurse

## 2016-12-28 ENCOUNTER — Encounter: Payer: Self-pay | Admitting: Registered Nurse

## 2016-12-28 VITALS — BP 128/85 | HR 85

## 2016-12-28 DIAGNOSIS — Z833 Family history of diabetes mellitus: Secondary | ICD-10-CM | POA: Insufficient documentation

## 2016-12-28 DIAGNOSIS — M259 Joint disorder, unspecified: Secondary | ICD-10-CM | POA: Insufficient documentation

## 2016-12-28 DIAGNOSIS — M1288 Other specific arthropathies, not elsewhere classified, other specified site: Secondary | ICD-10-CM | POA: Insufficient documentation

## 2016-12-28 DIAGNOSIS — E039 Hypothyroidism, unspecified: Secondary | ICD-10-CM | POA: Diagnosis not present

## 2016-12-28 DIAGNOSIS — K219 Gastro-esophageal reflux disease without esophagitis: Secondary | ICD-10-CM | POA: Diagnosis not present

## 2016-12-28 DIAGNOSIS — Z8619 Personal history of other infectious and parasitic diseases: Secondary | ICD-10-CM | POA: Diagnosis not present

## 2016-12-28 DIAGNOSIS — Z7901 Long term (current) use of anticoagulants: Secondary | ICD-10-CM | POA: Insufficient documentation

## 2016-12-28 DIAGNOSIS — Z87891 Personal history of nicotine dependence: Secondary | ICD-10-CM | POA: Insufficient documentation

## 2016-12-28 DIAGNOSIS — Z79899 Other long term (current) drug therapy: Secondary | ICD-10-CM

## 2016-12-28 DIAGNOSIS — G8929 Other chronic pain: Secondary | ICD-10-CM | POA: Diagnosis not present

## 2016-12-28 DIAGNOSIS — M519 Unspecified thoracic, thoracolumbar and lumbosacral intervertebral disc disorder: Secondary | ICD-10-CM | POA: Diagnosis not present

## 2016-12-28 DIAGNOSIS — Z8261 Family history of arthritis: Secondary | ICD-10-CM | POA: Insufficient documentation

## 2016-12-28 DIAGNOSIS — G894 Chronic pain syndrome: Secondary | ICD-10-CM | POA: Diagnosis not present

## 2016-12-28 DIAGNOSIS — Z8249 Family history of ischemic heart disease and other diseases of the circulatory system: Secondary | ICD-10-CM | POA: Diagnosis not present

## 2016-12-28 DIAGNOSIS — F4311 Post-traumatic stress disorder, acute: Secondary | ICD-10-CM | POA: Insufficient documentation

## 2016-12-28 DIAGNOSIS — I10 Essential (primary) hypertension: Secondary | ICD-10-CM | POA: Diagnosis not present

## 2016-12-28 DIAGNOSIS — M4696 Unspecified inflammatory spondylopathy, lumbar region: Secondary | ICD-10-CM

## 2016-12-28 DIAGNOSIS — Z5181 Encounter for therapeutic drug level monitoring: Secondary | ICD-10-CM

## 2016-12-28 DIAGNOSIS — M47816 Spondylosis without myelopathy or radiculopathy, lumbar region: Secondary | ICD-10-CM

## 2016-12-28 MED ORDER — TAPENTADOL HCL 75 MG PO TABS
75.0000 mg | ORAL_TABLET | Freq: Four times a day (QID) | ORAL | 0 refills | Status: DC | PRN
Start: 2016-12-28 — End: 2017-01-25

## 2016-12-28 NOTE — Progress Notes (Signed)
Subjective:    Patient ID: Suzanne Villanueva, female    DOB: Mar 31, 1981, 36 y.o.   MRN: [id number]  HPI: Suzanne Villanueva is a 36 year old female who returns for follow up appointment and medication refill. She states her pain is located in her mid- lower back and left hip. She rates her pain 7. Her current exercise regime is walking, performing stretching and cardio exercises.  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  Pain Inventory Average Pain 7 Pain Right Now 7 My pain is sharp and aching  In the last 24 hours, has pain interfered with the following? General activity 6 Relation with others 6 Enjoyment of life 5 What TIME of day is your pain at its worst? varies Sleep (in general) NA  Pain is worse with: inactivity and some activites Pain improves with: rest, heat/ice, therapy/exercise and medication Relief from Meds: 7  Mobility Do you have any goals in this area?  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  no  Neuro/Psych No problems in this area  Prior Studies Any changes since last visit?  no  Physicians involved in your care Any changes since last visit?  no   Family History  Problem Relation Age of Onset  . Heart attack Father   . Heart disease Unknown        parents  . Hypertension Unknown        parents  . Arthritis Unknown        grandparents  . Diabetes Unknown        parents   Social History   Social History  . Marital status: Married    Spouse name: N/A  . Number of children: N/A  . Years of education: N/A   Social History Main Topics  . Smoking status: Former Smoker    Packs/day: 0.50    Quit date: 01/14/2011  . Smokeless tobacco: Never Used  . Alcohol use No  . Drug use: No  . Sexual activity: Yes    Birth control/ protection: None   Other Topics Concern  . None   Social History Narrative  . None   Past Surgical History:  Procedure Laterality  Date  . CESAREAN SECTION  09/14/2011   Procedure: CESAREAN SECTION;  Surgeon: Freddrick March. Tenny Craw, MD;  Location: [facility];  Service: Gynecology;  Laterality: N/A;  Primary Cesarean Section with birth of baby girl @ 2302  . MYRINGOTOMY    . TONSILLECTOMY  1990  . TYMPANOSTOMY TUBE PLACEMENT     '90, '96, '12  . WISDOM TOOTH EXTRACTION     Past Medical History:  Diagnosis Date  . Abnormal Pap smear   . Abscess    behind the right ear  . Chicken pox   . Complication of anesthesia    Getsvery emotional  . Depression   . GERD (gastroesophageal reflux disease)   . Heart murmur   . History of CHF (congestive heart failure)    post partum 09/2011 - echo 09/21/11: EF 60-65%, mod MR, mild LAE (due to post partum fluid shifts, HTN, obesity, ?MR)  . Hypertension   . Hypothyroidism   . Migraines   . Moderate mitral regurgitation    needs echo 03/2012  . MRSA (methicillin resistant staph aureus) culture positive   . PTSD (post-traumatic stress disorder)   . Scarlet fever x 2  . Seasonal allergies   .  Urinary tract infection    BP 128/85   Pulse 85   SpO2 98%   Opioid Risk Score:  2 Fall Risk Score:  `1  Depression screen PHQ 2/9  Depression screen [facility] 2/9 12/28/2016 09/07/2016 03/23/2016 09/17/2015 04/22/2015 10/08/2014 04/08/2014  Decreased Interest 0 0 1 0 0 0 0  Down, Depressed, Hopeless 0 0 0 0 0 1 0  PHQ - 2 Score 0 0 1 0 0 1 0  Altered sleeping - 0 2 - - - -  Tired, decreased energy - 0 1 - - - -  Change in appetite - 0 0 - - - -  Feeling bad or failure about yourself  - 0 0 - - - -  Trouble concentrating - 0 0 - - - -  Moving slowly or fidgety/restless - 0 0 - - - -  Suicidal thoughts - 0 0 - - - -  PHQ-9 Score - 0 4 - - - -  Difficult doing work/chores - - Not difficult at all - - - -    Review of Systems  Constitutional: Negative.   HENT: Negative.   Eyes: Negative.   Respiratory: Negative.   Cardiovascular: Negative.   Gastrointestinal: Negative.   Endocrine: Negative.     Genitourinary: Negative.   Musculoskeletal: Negative.   Skin: Negative.   Allergic/Immunologic: Negative.   Neurological: Negative.   Hematological: Negative.   Psychiatric/Behavioral: Negative.   All other systems reviewed and are negative.      Objective:   Physical Exam  Constitutional: She is oriented to person, place, and time. She appears well-developed and well-nourished.  HENT:  Head: Normocephalic and atraumatic.  Neck: Normal range of motion. Neck supple.  Cardiovascular: Normal rate and regular rhythm.   Pulmonary/Chest: Effort normal and breath sounds normal.  Musculoskeletal:  Normal Muscle Bulk and Muscle Testing Reveals: Upper Extremities: Full ROM and Muscle Strength 5/5 Lumbar Paraspinal Tenderness: L-3-L-5 Lower Extremities: Full ROM and Muscle Strength 5/5 Arises from table with ease  Narrow Based Gait  Neurological: She is alert and oriented to person, place, and time.  Skin: Skin is warm and dry.  Psychiatric: She has a normal mood and affect.  Nursing note and vitals reviewed.         Assessment & Plan:  1. Thoracic degenerative disc as well as lumbar spondylosis.12/29/2016 Refilled Nucynta 75 mg one tablet every 6 hours as needed. #120 We will continue the opioid monitoring program, this consists of regular clinic visits, examinations, urine drug screen, pill counts as well as use of [place] Controlled Substance Reporting System. 2. Left Greater Trochanteric Tenderness: Continue with HEP and Heat and Ice Therapy. 12/29/2016  20 minutes of face to face patient care time was spent during this visit. All questions were encouraged and answered.   F/U in 1 month

## 2017-01-25 ENCOUNTER — Telehealth: Payer: Self-pay | Admitting: Registered Nurse

## 2017-01-25 ENCOUNTER — Encounter: Payer: 59 | Attending: Physical Medicine & Rehabilitation | Admitting: Registered Nurse

## 2017-01-25 ENCOUNTER — Encounter: Payer: Self-pay | Admitting: Registered Nurse

## 2017-01-25 VITALS — BP 127/81

## 2017-01-25 DIAGNOSIS — Z8619 Personal history of other infectious and parasitic diseases: Secondary | ICD-10-CM | POA: Insufficient documentation

## 2017-01-25 DIAGNOSIS — Z833 Family history of diabetes mellitus: Secondary | ICD-10-CM | POA: Insufficient documentation

## 2017-01-25 DIAGNOSIS — Z87891 Personal history of nicotine dependence: Secondary | ICD-10-CM | POA: Insufficient documentation

## 2017-01-25 DIAGNOSIS — M4696 Unspecified inflammatory spondylopathy, lumbar region: Secondary | ICD-10-CM

## 2017-01-25 DIAGNOSIS — Z79899 Other long term (current) drug therapy: Secondary | ICD-10-CM | POA: Insufficient documentation

## 2017-01-25 DIAGNOSIS — M1288 Other specific arthropathies, not elsewhere classified, other specified site: Secondary | ICD-10-CM | POA: Insufficient documentation

## 2017-01-25 DIAGNOSIS — M519 Unspecified thoracic, thoracolumbar and lumbosacral intervertebral disc disorder: Secondary | ICD-10-CM | POA: Insufficient documentation

## 2017-01-25 DIAGNOSIS — Z5181 Encounter for therapeutic drug level monitoring: Secondary | ICD-10-CM | POA: Diagnosis not present

## 2017-01-25 DIAGNOSIS — E039 Hypothyroidism, unspecified: Secondary | ICD-10-CM | POA: Diagnosis not present

## 2017-01-25 DIAGNOSIS — G894 Chronic pain syndrome: Secondary | ICD-10-CM

## 2017-01-25 DIAGNOSIS — I10 Essential (primary) hypertension: Secondary | ICD-10-CM | POA: Diagnosis not present

## 2017-01-25 DIAGNOSIS — K219 Gastro-esophageal reflux disease without esophagitis: Secondary | ICD-10-CM | POA: Diagnosis not present

## 2017-01-25 DIAGNOSIS — Z7901 Long term (current) use of anticoagulants: Secondary | ICD-10-CM | POA: Insufficient documentation

## 2017-01-25 DIAGNOSIS — M259 Joint disorder, unspecified: Secondary | ICD-10-CM | POA: Insufficient documentation

## 2017-01-25 DIAGNOSIS — M47816 Spondylosis without myelopathy or radiculopathy, lumbar region: Secondary | ICD-10-CM

## 2017-01-25 DIAGNOSIS — F4311 Post-traumatic stress disorder, acute: Secondary | ICD-10-CM | POA: Diagnosis not present

## 2017-01-25 DIAGNOSIS — Z8249 Family history of ischemic heart disease and other diseases of the circulatory system: Secondary | ICD-10-CM | POA: Insufficient documentation

## 2017-01-25 DIAGNOSIS — G8929 Other chronic pain: Secondary | ICD-10-CM | POA: Diagnosis not present

## 2017-01-25 DIAGNOSIS — Z8261 Family history of arthritis: Secondary | ICD-10-CM | POA: Insufficient documentation

## 2017-01-25 MED ORDER — TAPENTADOL HCL 75 MG PO TABS
75.0000 mg | ORAL_TABLET | Freq: Four times a day (QID) | ORAL | 0 refills | Status: DC | PRN
Start: 2017-01-25 — End: 2017-02-22

## 2017-01-25 NOTE — Progress Notes (Signed)
Subjective:    Patient ID: Suzanne Villanueva, female    DOB: 01-26-1981, 36 y.o.   MRN: [id number]  HPI: [name] is a 36 year old female who returns for follow up appointment and medication refill. She states her pain is located in her mid- lower back. She rates her pain 4. Her current exercise regime is walking, performing stretching and cardio exercises.  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   Pain Inventory Average Pain 7 Pain Right Now 4 My pain is sharp, burning and aching  In the last 24 hours, has pain interfered with the following? General activity 4 Relation with others 4 Enjoyment of life 5 What TIME of day is your pain at its worst? evening Sleep (in general) Fair  Pain is worse with: inactivity and some activites Pain improves with: heat/ice, medication and TENS Relief from Meds: 6  Mobility Do you have any goals in this area?  no  Function Do you have any goals in this area?  no  Neuro/Psych No problems in this area  Prior Studies Any changes since last visit?  no  Physicians involved in your care Any changes since last visit?  no   Family History  Problem Relation Age of Onset  . Heart attack Father   . Heart disease Unknown        parents  . Hypertension Unknown        parents  . Arthritis Unknown        grandparents  . Diabetes Unknown        parents   Social History   Social History  . Marital status: Married    Spouse name: N/A  . Number of children: N/A  . Years of education: N/A   Social History Main Topics  . Smoking status: Former Smoker    Packs/day: 0.50    Quit date: 01/14/2011  . Smokeless tobacco: Never Used  . Alcohol use No  . Drug use: No  . Sexual activity: Yes    Birth control/ protection: None   Other Topics Concern  . Not on file   Social History Narrative  . No narrative on file   Past Surgical History:  Procedure Laterality Date  . CESAREAN SECTION  09/14/2011   Procedure: CESAREAN  SECTION;  Surgeon: Freddrick March. Tenny Craw, MD;  Location: [facility];  Service: Gynecology;  Laterality: N/A;  Primary Cesarean Section with birth of baby girl @ 2302  . MYRINGOTOMY    . TONSILLECTOMY  1990  . TYMPANOSTOMY TUBE PLACEMENT     '90, '96, '12  . WISDOM TOOTH EXTRACTION     Past Medical History:  Diagnosis Date  . Abnormal Pap smear   . Abscess    behind the right ear  . Chicken pox   . Complication of anesthesia    Getsvery emotional  . Depression   . GERD (gastroesophageal reflux disease)   . Heart murmur   . History of CHF (congestive heart failure)    post partum 09/2011 - echo 09/21/11: EF 60-65%, mod MR, mild LAE (due to post partum fluid shifts, HTN, obesity, ?MR)  . Hypertension   . Hypothyroidism   . Migraines   . Moderate mitral regurgitation    needs echo 03/2012  . MRSA (methicillin resistant staph aureus) culture positive   . PTSD (post-traumatic stress disorder)   . Scarlet fever x 2  . Seasonal allergies   . Urinary tract infection    There  were no vitals taken for this visit.  Opioid Risk Score:  2 Fall Risk Score:  `1  Depression screen PHQ 2/9  Depression screen [facility] 2/9 01/25/2017 12/28/2016 09/07/2016 03/23/2016 09/17/2015 04/22/2015 10/08/2014  Decreased Interest 0 0 0 1 0 0 0  Down, Depressed, Hopeless 0 0 0 0 0 0 1  PHQ - 2 Score 0 0 0 1 0 0 1  Altered sleeping - - 0 2 - - -  Tired, decreased energy - - 0 1 - - -  Change in appetite - - 0 0 - - -  Feeling bad or failure about yourself  - - 0 0 - - -  Trouble concentrating - - 0 0 - - -  Moving slowly or fidgety/restless - - 0 0 - - -  Suicidal thoughts - - 0 0 - - -  PHQ-9 Score - - 0 4 - - -  Difficult doing work/chores - - - Not difficult at all - - -    Review of Systems  Constitutional: Negative.   HENT: Negative.   Eyes: Negative.   Respiratory: Negative.   Cardiovascular: Negative.   Gastrointestinal: Negative.   Endocrine: Negative.   Genitourinary: Negative.   Musculoskeletal: Negative.    Skin: Negative.   Allergic/Immunologic: Negative.   Neurological: Negative.   Hematological: Negative.   Psychiatric/Behavioral: Negative.   All other systems reviewed and are negative.      Objective:   Physical Exam  Constitutional: She is oriented to person, place, and time. She appears well-developed and well-nourished.  HENT:  Head: Normocephalic and atraumatic.  Neck: Normal range of motion. Neck supple.  Cardiovascular: Normal rate and regular rhythm.   Pulmonary/Chest: Effort normal and breath sounds normal.  Musculoskeletal:  Normal Muscle Bulk and Muscle Testing Reveals: Upper Extremities: Full ROM and Muscle Strength 5/5 Back without spinal tenderness noted Lower Extremities: Full ROM and Muscle Strength 5/5 Arises from Table with ease Narrow Based Gait  Neurological: She is alert and oriented to person, place, and time.  Skin: Skin is warm and dry.  Psychiatric: She has a normal mood and affect.  Nursing note and vitals reviewed.         Assessment & Plan:  1. Thoracic degenerative disc as well as lumbar spondylosis.01/25/2017 Refilled Nucynta 75 mg one tablet every 6 hours as needed. #120 We will continue the opioid monitoring program, this consists of regular clinic visits, examinations, urine drug screen, pill counts as well as use of [place] Controlled Substance Reporting System. 2. Left Greater Trochanteric Tenderness: No complaints Today. Continue with HEP and Heat and Ice Therapy. 01/25/2017  20 minutes of face to face patient care time was spent during this visit. All questions were encouraged and answered.  F/U in 1 month

## 2017-01-25 NOTE — Telephone Encounter (Signed)
On 01/25/2017 the  NCCSR was reviewed no conflict was seen on the [facility] Controlled Substance Reporting System with multiple prescribers. Suzanne Villanueva has a signed narcotic contract with our office. If there were any discrepancies this would have been reported to her physician.

## 2017-02-08 ENCOUNTER — Ambulatory Visit: Payer: 59 | Admitting: Physical Medicine & Rehabilitation

## 2017-02-22 ENCOUNTER — Encounter: Payer: Self-pay | Admitting: Physical Medicine & Rehabilitation

## 2017-02-22 ENCOUNTER — Encounter: Payer: 59 | Attending: Physical Medicine & Rehabilitation

## 2017-02-22 ENCOUNTER — Ambulatory Visit (HOSPITAL_BASED_OUTPATIENT_CLINIC_OR_DEPARTMENT_OTHER): Payer: 59 | Admitting: Physical Medicine & Rehabilitation

## 2017-02-22 VITALS — BP 139/86 | HR 76

## 2017-02-22 DIAGNOSIS — E039 Hypothyroidism, unspecified: Secondary | ICD-10-CM | POA: Insufficient documentation

## 2017-02-22 DIAGNOSIS — M1288 Other specific arthropathies, not elsewhere classified, other specified site: Secondary | ICD-10-CM | POA: Insufficient documentation

## 2017-02-22 DIAGNOSIS — Z87891 Personal history of nicotine dependence: Secondary | ICD-10-CM | POA: Diagnosis not present

## 2017-02-22 DIAGNOSIS — M519 Unspecified thoracic, thoracolumbar and lumbosacral intervertebral disc disorder: Secondary | ICD-10-CM | POA: Insufficient documentation

## 2017-02-22 DIAGNOSIS — Z8261 Family history of arthritis: Secondary | ICD-10-CM | POA: Insufficient documentation

## 2017-02-22 DIAGNOSIS — M545 Low back pain, unspecified: Secondary | ICD-10-CM | POA: Insufficient documentation

## 2017-02-22 DIAGNOSIS — Z79899 Other long term (current) drug therapy: Secondary | ICD-10-CM | POA: Insufficient documentation

## 2017-02-22 DIAGNOSIS — M259 Joint disorder, unspecified: Secondary | ICD-10-CM | POA: Insufficient documentation

## 2017-02-22 DIAGNOSIS — K219 Gastro-esophageal reflux disease without esophagitis: Secondary | ICD-10-CM | POA: Diagnosis not present

## 2017-02-22 DIAGNOSIS — Z833 Family history of diabetes mellitus: Secondary | ICD-10-CM | POA: Insufficient documentation

## 2017-02-22 DIAGNOSIS — Z7901 Long term (current) use of anticoagulants: Secondary | ICD-10-CM | POA: Insufficient documentation

## 2017-02-22 DIAGNOSIS — G8929 Other chronic pain: Secondary | ICD-10-CM

## 2017-02-22 DIAGNOSIS — Z8249 Family history of ischemic heart disease and other diseases of the circulatory system: Secondary | ICD-10-CM | POA: Diagnosis not present

## 2017-02-22 DIAGNOSIS — Z8619 Personal history of other infectious and parasitic diseases: Secondary | ICD-10-CM | POA: Insufficient documentation

## 2017-02-22 DIAGNOSIS — M47816 Spondylosis without myelopathy or radiculopathy, lumbar region: Secondary | ICD-10-CM | POA: Insufficient documentation

## 2017-02-22 DIAGNOSIS — F4311 Post-traumatic stress disorder, acute: Secondary | ICD-10-CM | POA: Diagnosis not present

## 2017-02-22 DIAGNOSIS — I10 Essential (primary) hypertension: Secondary | ICD-10-CM | POA: Insufficient documentation

## 2017-02-22 MED ORDER — TAPENTADOL HCL 75 MG PO TABS: 75.0000 mg | ORAL_TABLET | Freq: Four times a day (QID) | ORAL | 0 refills | Status: DC | PRN

## 2017-02-22 NOTE — Progress Notes (Signed)
Subjective:    Patient ID: Suzanne Villanueva, female    DOB: [birth year], 36 y.o.   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 She continues to complain of low back pain, with her medications. She is able to tolerate her usual activities. She does continue do lifting at work as a Radiation protection practitioner. We discussed strengthening of the core musculature. Pain Inventory Average Pain 6 Pain Right Now 3 My pain is burning, dull and aching  In the last 24 hours, has pain interfered with the following? General activity 4 Relation with others 3 Enjoyment of life 4 What TIME of day is your pain at its worst? varies Sleep (in general) NA  Pain is worse with: inactivity and some activites Pain improves with: rest, heat/ice, therapy/exercise, medication and TENS Relief from Meds: 4  Mobility Do you have any goals in this area?  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  no  Physicians involved in your care Any changes since last visit?  no   Family History  Problem Relation Age of Onset  . Heart attack Father   . Heart disease Unknown        parents  . Hypertension Unknown        parents  . Arthritis Unknown        grandparents  . Diabetes Unknown        parents   Social History   Social History  . Marital status: Married    Spouse name: N/A  . Number of children: N/A  . Years of education: N/A   Social History Main Topics  . Smoking status: Former Smoker    Packs/day: 0.50    Quit date: 01/14/2011  . Smokeless tobacco: Never Used  . Alcohol use No  . Drug use: No  . Sexual activity: Yes    Birth control/ protection: None   Other Topics Concern  . None   Social History Narrative  . None   Past Surgical History:  Procedure Laterality Date  . CESAREAN SECTION  09/14/2011   Procedure:  CESAREAN SECTION;  Surgeon: Freddrick March. Tenny Craw, MD;  Location: [facility];  Service: Gynecology;  Laterality: N/A;  Primary Cesarean Section with birth of baby girl @ 2302  . MYRINGOTOMY    . TONSILLECTOMY  1990  . TYMPANOSTOMY TUBE PLACEMENT     '90, '96, '12  . WISDOM TOOTH EXTRACTION     Past Medical History:  Diagnosis Date  . Abnormal Pap smear   . Abscess    behind the right ear  . Chicken pox   . Complication of anesthesia    Getsvery emotional  . Depression   . GERD (gastroesophageal reflux disease)   . Heart murmur   . History of CHF (congestive heart failure)    post partum 09/2011 - echo 09/21/11: EF 60-65%, mod MR, mild LAE (due to post partum fluid shifts, HTN, obesity, ?MR)  . Hypertension   . Hypothyroidism   . Migraines   . Moderate mitral regurgitation    needs echo 03/2012  . MRSA (methicillin resistant staph aureus) culture positive   . PTSD (post-traumatic stress disorder)   . Scarlet fever x 2  . Seasonal allergies   . Urinary tract infection    BP 139/86   Pulse 76   SpO2 98%  Opioid Risk Score:  2 Fall Risk Score:  `1  Depression screen PHQ 2/9  Depression screen [facility] 2/9 02/22/2017 01/25/2017 12/28/2016 09/07/2016 03/23/2016 09/17/2015 04/22/2015  Decreased Interest 0 0 0 0 1 0 0  Down, Depressed, Hopeless 0 0 0 0 0 0 0  PHQ - 2 Score 0 0 0 0 1 0 0  Altered sleeping - - - 0 2 - -  Tired, decreased energy - - - 0 1 - -  Change in appetite - - - 0 0 - -  Feeling bad or failure about yourself  - - - 0 0 - -  Trouble concentrating - - - 0 0 - -  Moving slowly or fidgety/restless - - - 0 0 - -  Suicidal thoughts - - - 0 0 - -  PHQ-9 Score - - - 0 4 - -  Difficult doing work/chores - - - - Not difficult at all - -    Review of Systems  Constitutional: Negative.   HENT: Negative.   Eyes: Negative.   Respiratory: Negative.   Cardiovascular: Negative.   Gastrointestinal: Negative.   Endocrine: Negative.   Genitourinary: Negative.   Musculoskeletal: Negative.    Skin: Negative.   Allergic/Immunologic: Negative.   Neurological: Negative.   Hematological: Negative.   Psychiatric/Behavioral: Negative.   All other systems reviewed and are negative.      Objective:   Physical Exam  Constitutional: She is oriented to person, place, and time. She appears well-developed and well-nourished.  HENT:  Head: Normocephalic and atraumatic.  Eyes: Pupils are equal, round, and reactive to light. Conjunctivae and EOM are normal.  Neck: Normal range of motion.  Neurological: She is alert and oriented to person, place, and time.  Psychiatric: She has a normal mood and affect.  Nursing note and vitals reviewed.  patient has tenderness palpation bilateral L4, L5, S1 paraspinal muscles. Also some tenderness of the greater trochanter of the both hip. She has pain with forward flexion as well as lumbar extension. Her lumbar range of motion, reduced to 50% flexion and extension. Negative straight leg raising. Motor strength is 5/5 bilateral hip flexor, knee extensor, ankle dorsal flexor.        Assessment & Plan:  1. Lumbar spondylosis plus minus sacroiliac disorder. She does not wish to pursue any type of injection therapy due to insurance co-pay costs. She is functioning reasonably well on a medication, wrist regimen which consists of Nucynta 75 mg 4 times a day No signs of abuse Due for repeat  Toxicology screening  Continue opioid monitoring program. This consists of regular clinic visits, examinations, u drug screen, pill counts as well as use of [place] controlled substance reporting System.  Nurse practitioner. Visit 1 month  Discussed some exercises for her low back pain Printout was given

## 2017-02-22 NOTE — Patient Instructions (Signed)

## 2017-03-07 ENCOUNTER — Ambulatory Visit: Payer: 59 | Admitting: Family Medicine

## 2017-03-21 ENCOUNTER — Telehealth: Payer: Self-pay | Admitting: Registered Nurse

## 2017-03-21 NOTE — Telephone Encounter (Signed)
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 Her appointment on 03/22/2017 will be cancelled. She was instructed to call our office on 03/27/2017 when she returns home. She verbalizes understanding.

## 2017-03-22 ENCOUNTER — Encounter: Payer: 59 | Admitting: Registered Nurse

## 2017-03-30 ENCOUNTER — Encounter: Payer: Self-pay | Admitting: Registered Nurse

## 2017-03-30 ENCOUNTER — Encounter: Payer: 59 | Attending: Physical Medicine & Rehabilitation | Admitting: Registered Nurse

## 2017-03-30 VITALS — BP 138/85 | HR 71

## 2017-03-30 DIAGNOSIS — K219 Gastro-esophageal reflux disease without esophagitis: Secondary | ICD-10-CM | POA: Insufficient documentation

## 2017-03-30 DIAGNOSIS — E039 Hypothyroidism, unspecified: Secondary | ICD-10-CM | POA: Diagnosis not present

## 2017-03-30 DIAGNOSIS — I10 Essential (primary) hypertension: Secondary | ICD-10-CM | POA: Insufficient documentation

## 2017-03-30 DIAGNOSIS — Z5181 Encounter for therapeutic drug level monitoring: Secondary | ICD-10-CM | POA: Diagnosis not present

## 2017-03-30 DIAGNOSIS — Z8619 Personal history of other infectious and parasitic diseases: Secondary | ICD-10-CM | POA: Insufficient documentation

## 2017-03-30 DIAGNOSIS — Z79899 Other long term (current) drug therapy: Secondary | ICD-10-CM | POA: Diagnosis not present

## 2017-03-30 DIAGNOSIS — Z833 Family history of diabetes mellitus: Secondary | ICD-10-CM | POA: Diagnosis not present

## 2017-03-30 DIAGNOSIS — G8929 Other chronic pain: Secondary | ICD-10-CM | POA: Diagnosis not present

## 2017-03-30 DIAGNOSIS — M259 Joint disorder, unspecified: Secondary | ICD-10-CM | POA: Insufficient documentation

## 2017-03-30 DIAGNOSIS — M1288 Other specific arthropathies, not elsewhere classified, other specified site: Secondary | ICD-10-CM | POA: Insufficient documentation

## 2017-03-30 DIAGNOSIS — Z8249 Family history of ischemic heart disease and other diseases of the circulatory system: Secondary | ICD-10-CM | POA: Diagnosis not present

## 2017-03-30 DIAGNOSIS — F4311 Post-traumatic stress disorder, acute: Secondary | ICD-10-CM | POA: Insufficient documentation

## 2017-03-30 DIAGNOSIS — G894 Chronic pain syndrome: Secondary | ICD-10-CM

## 2017-03-30 DIAGNOSIS — M47816 Spondylosis without myelopathy or radiculopathy, lumbar region: Secondary | ICD-10-CM | POA: Diagnosis not present

## 2017-03-30 DIAGNOSIS — M519 Unspecified thoracic, thoracolumbar and lumbosacral intervertebral disc disorder: Secondary | ICD-10-CM | POA: Insufficient documentation

## 2017-03-30 DIAGNOSIS — Z87891 Personal history of nicotine dependence: Secondary | ICD-10-CM | POA: Insufficient documentation

## 2017-03-30 DIAGNOSIS — Z8261 Family history of arthritis: Secondary | ICD-10-CM | POA: Diagnosis not present

## 2017-03-30 DIAGNOSIS — M545 Low back pain: Secondary | ICD-10-CM

## 2017-03-30 DIAGNOSIS — Z7901 Long term (current) use of anticoagulants: Secondary | ICD-10-CM | POA: Insufficient documentation

## 2017-03-30 MED ORDER — TAPENTADOL HCL 75 MG PO TABS: 75.0000 mg | ORAL_TABLET | Freq: Four times a day (QID) | ORAL | 0 refills | Status: DC | PRN

## 2017-03-30 NOTE — Progress Notes (Signed)
Subjective:    Patient ID: Suzanne Villanueva, female    DOB: Apr 05, 1981, 36 y.o.   MRN: [id number]  HPI: [name] is a 36 year old female who returns for follow up appointment and medication refill. She states her pain is located in her mid-lower back radiating into her left hip. She rates her pain 3. Her current exercise regime is walking.   Ms. Record Morphine equivalent is 120.00 MME.   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  Pain Inventory Average Pain 7 Pain Right Now 3 My pain is intermittent, burning, dull, tingling and aching  In the last 24 hours, has pain interfered with the following? General activity 4 Relation with others 3 Enjoyment of life 3 What TIME of day is your pain at its worst? daytime Sleep (in general) Fair  Pain is worse with: inactivity and some activites Pain improves with: medication Relief from Meds: 5  Mobility walk without assistance  Function Do you have any goals in this area?  no  Neuro/Psych No problems in this area  Prior Studies Any changes since last visit?  no  Physicians involved in your care Any changes since last visit?  no   Family History  Problem Relation Age of Onset  . Heart attack Father   . Heart disease Unknown        parents  . Hypertension Unknown        parents  . Arthritis Unknown        grandparents  . Diabetes Unknown        parents   Social History   Social History  . Marital status: Married    Spouse name: N/A  . Number of children: N/A  . Years of education: N/A   Social History Main Topics  . Smoking status: Former Smoker    Packs/day: 0.50    Quit date: 01/14/2011  . Smokeless tobacco: Never Used  . Alcohol use No  . Drug use: No  . Sexual activity: Yes    Birth control/ protection: None   Other Topics Concern  . Not on file   Social History Narrative  . No narrative on file   Past Surgical History:  Procedure Laterality Date  . CESAREAN SECTION  09/14/2011   Procedure: CESAREAN SECTION;  Surgeon: Freddrick March. Tenny Craw, MD;  Location: [facility];  Service: Gynecology;  Laterality: N/A;  Primary Cesarean Section with birth of baby girl @ 2302  . MYRINGOTOMY    . TONSILLECTOMY  1990  . TYMPANOSTOMY TUBE PLACEMENT     '90, '96, '12  . WISDOM TOOTH EXTRACTION     Past Medical History:  Diagnosis Date  . Abnormal Pap smear   . Abscess    behind the right ear  . Chicken pox   . Complication of anesthesia    Getsvery emotional  . Depression   . GERD (gastroesophageal reflux disease)   . Heart murmur   . History of CHF (congestive heart failure)    post partum 09/2011 - echo 09/21/11: EF 60-65%, mod MR, mild LAE (due to post partum fluid shifts, HTN, obesity, ?MR)  . Hypertension   . Hypothyroidism   . Migraines   . Moderate mitral regurgitation    needs echo 03/2012  . MRSA (methicillin resistant staph aureus) culture positive   . PTSD (post-traumatic stress disorder)   . Scarlet fever x 2  . Seasonal allergies   . Urinary tract infection    BP 138/85  Pulse 71   SpO2 97%   Opioid Risk Score:   Fall Risk Score:  `1  Depression screen PHQ 2/9  Depression screen [facility] 2/9 02/22/2017 01/25/2017 12/28/2016 09/07/2016 03/23/2016 09/17/2015 04/22/2015  Decreased Interest 0 0 0 0 1 0 0  Down, Depressed, Hopeless 0 0 0 0 0 0 0  PHQ - 2 Score 0 0 0 0 1 0 0  Altered sleeping - - - 0 2 - -  Tired, decreased energy - - - 0 1 - -  Change in appetite - - - 0 0 - -  Feeling bad or failure about yourself  - - - 0 0 - -  Trouble concentrating - - - 0 0 - -  Moving slowly or fidgety/restless - - - 0 0 - -  Suicidal thoughts - - - 0 0 - -  PHQ-9 Score - - - 0 4 - -  Difficult doing work/chores - - - - Not difficult at all - -     Review of Systems  Constitutional: Negative.   HENT: Negative.   Eyes: Negative.   Respiratory: Negative.   Cardiovascular: Negative.   Gastrointestinal: Negative.   Endocrine: Negative.   Genitourinary: Negative.     Musculoskeletal: Negative.   Skin: Negative.   Allergic/Immunologic: Negative.   Neurological: Negative.   Hematological: Negative.   Psychiatric/Behavioral: Negative.   All other systems reviewed and are negative.      Objective:   Physical Exam  Constitutional: She is oriented to person, place, and time. She appears well-developed and well-nourished.  HENT:  Head: Normocephalic and atraumatic.  Neck: Normal range of motion. Neck supple.  Cardiovascular: Normal rate and regular rhythm.   Pulmonary/Chest: Effort normal and breath sounds normal.  Musculoskeletal:  Normal Muscle Bulk and Muscle Testing Reveals: Upper Extremities: Full ROM and Muscle Strength 5/5 Thoracic Paraspinal Tenderness: T-7-T-9 Lumbar Paraspinal Tenderness: L-4-L-5 Lower Extremities: Full ROM and Muscle Strength 5/5 Arises from Table with ease Narrow Based Gait  Neurological: She is alert and oriented to person, place, and time.  Skin: Skin is warm and dry.  Psychiatric: She has a normal mood and affect.  Nursing note and vitals reviewed.         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 #120 We will continue the opioid monitoring program, this consists of regular clinic visits, examinations, urine drug screen, pill counts as well as use of [place] Controlled Substance Reporting System. 2. Left Greater Trochanteric Tenderness: Continue with HEP and Heat and Ice Therapy.03/30/2017  20 minutes of face to face patient care time was spent during this visit. All questions were encouraged and answered.  F/U in 1 month

## 2017-04-24 ENCOUNTER — Encounter: Payer: 59 | Attending: Physical Medicine & Rehabilitation

## 2017-04-24 ENCOUNTER — Encounter: Payer: Self-pay | Admitting: Physical Medicine & Rehabilitation

## 2017-04-24 ENCOUNTER — Ambulatory Visit: Payer: 59 | Admitting: Physical Medicine & Rehabilitation

## 2017-04-24 VITALS — BP 156/101 | HR 63

## 2017-04-24 DIAGNOSIS — Z8619 Personal history of other infectious and parasitic diseases: Secondary | ICD-10-CM | POA: Diagnosis not present

## 2017-04-24 DIAGNOSIS — Z8249 Family history of ischemic heart disease and other diseases of the circulatory system: Secondary | ICD-10-CM | POA: Insufficient documentation

## 2017-04-24 DIAGNOSIS — M47816 Spondylosis without myelopathy or radiculopathy, lumbar region: Secondary | ICD-10-CM

## 2017-04-24 DIAGNOSIS — M1288 Other specific arthropathies, not elsewhere classified, other specified site: Secondary | ICD-10-CM | POA: Insufficient documentation

## 2017-04-24 DIAGNOSIS — K219 Gastro-esophageal reflux disease without esophagitis: Secondary | ICD-10-CM | POA: Insufficient documentation

## 2017-04-24 DIAGNOSIS — M259 Joint disorder, unspecified: Secondary | ICD-10-CM | POA: Insufficient documentation

## 2017-04-24 DIAGNOSIS — Z833 Family history of diabetes mellitus: Secondary | ICD-10-CM | POA: Diagnosis not present

## 2017-04-24 DIAGNOSIS — Z7901 Long term (current) use of anticoagulants: Secondary | ICD-10-CM | POA: Insufficient documentation

## 2017-04-24 DIAGNOSIS — Z87891 Personal history of nicotine dependence: Secondary | ICD-10-CM | POA: Insufficient documentation

## 2017-04-24 DIAGNOSIS — Z8261 Family history of arthritis: Secondary | ICD-10-CM | POA: Diagnosis not present

## 2017-04-24 DIAGNOSIS — Z79899 Other long term (current) drug therapy: Secondary | ICD-10-CM | POA: Insufficient documentation

## 2017-04-24 DIAGNOSIS — G8929 Other chronic pain: Secondary | ICD-10-CM | POA: Insufficient documentation

## 2017-04-24 DIAGNOSIS — F4311 Post-traumatic stress disorder, acute: Secondary | ICD-10-CM | POA: Diagnosis not present

## 2017-04-24 DIAGNOSIS — M519 Unspecified thoracic, thoracolumbar and lumbosacral intervertebral disc disorder: Secondary | ICD-10-CM

## 2017-04-24 DIAGNOSIS — E039 Hypothyroidism, unspecified: Secondary | ICD-10-CM | POA: Insufficient documentation

## 2017-04-24 DIAGNOSIS — I10 Essential (primary) hypertension: Secondary | ICD-10-CM | POA: Insufficient documentation

## 2017-04-24 MED ORDER — TAPENTADOL HCL 75 MG PO TABS: 75.0000 mg | ORAL_TABLET | Freq: Four times a day (QID) | ORAL | 0 refills | Status: DC | PRN

## 2017-04-24 NOTE — Patient Instructions (Signed)
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
## 2017-05-22 ENCOUNTER — Encounter: Payer: 59 | Admitting: Registered Nurse

## 2017-05-25 ENCOUNTER — Encounter: Payer: Self-pay | Admitting: Registered Nurse

## 2017-05-25 ENCOUNTER — Telehealth: Payer: Self-pay | Admitting: Registered Nurse

## 2017-05-25 ENCOUNTER — Encounter: Payer: 59 | Attending: Physical Medicine & Rehabilitation | Admitting: Registered Nurse

## 2017-05-25 VITALS — BP 146/89 | HR 75 | Resp 14

## 2017-05-25 DIAGNOSIS — Z87891 Personal history of nicotine dependence: Secondary | ICD-10-CM | POA: Insufficient documentation

## 2017-05-25 DIAGNOSIS — Z7901 Long term (current) use of anticoagulants: Secondary | ICD-10-CM | POA: Diagnosis not present

## 2017-05-25 DIAGNOSIS — Z5181 Encounter for therapeutic drug level monitoring: Secondary | ICD-10-CM | POA: Diagnosis not present

## 2017-05-25 DIAGNOSIS — Z8261 Family history of arthritis: Secondary | ICD-10-CM | POA: Insufficient documentation

## 2017-05-25 DIAGNOSIS — F4311 Post-traumatic stress disorder, acute: Secondary | ICD-10-CM | POA: Insufficient documentation

## 2017-05-25 DIAGNOSIS — Z833 Family history of diabetes mellitus: Secondary | ICD-10-CM | POA: Diagnosis not present

## 2017-05-25 DIAGNOSIS — Z8249 Family history of ischemic heart disease and other diseases of the circulatory system: Secondary | ICD-10-CM | POA: Diagnosis not present

## 2017-05-25 DIAGNOSIS — K219 Gastro-esophageal reflux disease without esophagitis: Secondary | ICD-10-CM | POA: Diagnosis not present

## 2017-05-25 DIAGNOSIS — M47816 Spondylosis without myelopathy or radiculopathy, lumbar region: Secondary | ICD-10-CM | POA: Diagnosis not present

## 2017-05-25 DIAGNOSIS — E039 Hypothyroidism, unspecified: Secondary | ICD-10-CM | POA: Diagnosis not present

## 2017-05-25 DIAGNOSIS — G894 Chronic pain syndrome: Secondary | ICD-10-CM

## 2017-05-25 DIAGNOSIS — M519 Unspecified thoracic, thoracolumbar and lumbosacral intervertebral disc disorder: Secondary | ICD-10-CM | POA: Diagnosis not present

## 2017-05-25 DIAGNOSIS — Z79899 Other long term (current) drug therapy: Secondary | ICD-10-CM

## 2017-05-25 DIAGNOSIS — Z8619 Personal history of other infectious and parasitic diseases: Secondary | ICD-10-CM | POA: Insufficient documentation

## 2017-05-25 DIAGNOSIS — I10 Essential (primary) hypertension: Secondary | ICD-10-CM | POA: Diagnosis not present

## 2017-05-25 DIAGNOSIS — M259 Joint disorder, unspecified: Secondary | ICD-10-CM | POA: Insufficient documentation

## 2017-05-25 DIAGNOSIS — G8929 Other chronic pain: Secondary | ICD-10-CM | POA: Insufficient documentation

## 2017-05-25 DIAGNOSIS — M1288 Other specific arthropathies, not elsewhere classified, other specified site: Secondary | ICD-10-CM | POA: Diagnosis not present

## 2017-05-25 MED ORDER — TAPENTADOL HCL 75 MG PO TABS: 75.0000 mg | ORAL_TABLET | Freq: Four times a day (QID) | ORAL | 0 refills | Status: DC | PRN

## 2017-05-25 NOTE — Telephone Encounter (Signed)
On 05/25/2017 the  NCCSR was reviewed no conflict was seen on the [facility] Controlled Substance Reporting System with multiple prescribers. Suzanne Villanueva  has a signed narcotic contract with our office. If there were any discrepancies this would have been reported to her physician.

## 2017-05-25 NOTE — Progress Notes (Signed)
Subjective:    Patient ID: Suzanne Villanueva, female    DOB: 1981/03/10, 36 y.o.   MRN: [id number]  HPI: [name] is a 36 year old female who returns for follow up appointment and medication refill. She states her pain is located in her mid back and lower back mainly left side. She rates her pain 4. Her current exercise regime is walking.   Ms. Bonus Morphine equivalent is 120.00 MME.   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  Pain Inventory Average Pain 6 Pain Right Now 4 My pain is intermittent, burning, dull and aching  In the last 24 hours, has pain interfered with the following? General activity 4 Relation with others 5 Enjoyment of life 4 What TIME of day is your pain at its worst? daytime Sleep (in general) Fair  Pain is worse with: inactivity and some activites Pain improves with: rest, heat/ice, therapy/exercise, medication and TENS Relief from Meds: 3  Mobility walk without assistance Do you have any goals in this area?  no  Function Do you have any goals in this area?  no  Neuro/Psych No problems in this area  Prior Studies Any changes since last visit?  no  Physicians involved in your care Any changes since last visit?  no   Family History  Problem Relation Age of Onset  . Heart attack Father   . Heart disease Unknown        parents  . Hypertension Unknown        parents  . Arthritis Unknown        grandparents  . Diabetes Unknown        parents   Social History   Socioeconomic History  . Marital status: Married    Spouse name: None  . Number of children: None  . Years of education: None  . Highest education level: None  Social Needs  . Financial resource strain: None  . Food insecurity - worry: None  . Food insecurity - inability: None  . Transportation needs - medical: None  . Transportation needs - non-medical: None  Occupational History  . None  Tobacco Use  . Smoking status: Former Smoker    Packs/day: 0.50    Last  attempt to quit: 01/14/2011    Years since quitting: 6.3  . Smokeless tobacco: Never Used  Substance and Sexual Activity  . Alcohol use: No  . Drug use: No  . Sexual activity: Yes    Birth control/protection: None  Other Topics Concern  . None  Social History Narrative  . None   Past Surgical History:  Procedure Laterality Date  . CESAREAN SECTION  09/14/2011   Procedure: CESAREAN SECTION;  Surgeon: Freddrick March. Tenny Craw, MD;  Location: [facility];  Service: Gynecology;  Laterality: N/A;  Primary Cesarean Section with birth of baby girl @ 2302  . MYRINGOTOMY    . TONSILLECTOMY  1990  . TYMPANOSTOMY TUBE PLACEMENT     '90, '96, '12  . WISDOM TOOTH EXTRACTION     Past Medical History:  Diagnosis Date  . Abnormal Pap smear   . Abscess    behind the right ear  . Chicken pox   . Complication of anesthesia    Getsvery emotional  . Depression   . GERD (gastroesophageal reflux disease)   . Heart murmur   . History of CHF (congestive heart failure)    post partum 09/2011 - echo 09/21/11: EF 60-65%, mod MR, mild LAE (due to post  partum fluid shifts, HTN, obesity, ?MR)  . Hypertension   . Hypothyroidism   . Migraines   . Moderate mitral regurgitation    needs echo 03/2012  . MRSA (methicillin resistant staph aureus) culture positive   . PTSD (post-traumatic stress disorder)   . Scarlet fever x 2  . Seasonal allergies   . Urinary tract infection    BP (!) 154/101 (BP Location: Right Arm, Patient Position: Sitting, Cuff Size: Large)   Pulse 83   Resp 14   SpO2 98%   Opioid Risk Score:   Fall Risk Score:  `1  Depression screen PHQ 2/9  [facility] 04/24/2017 02/22/2017 01/25/2017 12/28/2016 09/07/2016 03/23/2016 09/17/2015  Decreased Interest 0 0 0 0 0 1 0  Down, Depressed, Hopeless 0 0 0 0 0 0 0  PHQ - 2 Score 0 0 0 0 0 1 0  Altered sleeping - - - - 0 2 -  Tired, decreased energy - - - - 0 1 -  Change in appetite - - - - 0 0 -  Feeling bad or failure about yourself  - - - - 0  0 -  Trouble concentrating - - - - 0 0 -  Moving slowly or fidgety/restless - - - - 0 0 -  Suicidal thoughts - - - - 0 0 -  PHQ-9 Score - - - - 0 4 -  Difficult doing work/chores - - - - - Not difficult at all -     Review of Systems  Constitutional: Negative.   HENT: Negative.   Eyes: Negative.   Respiratory: Negative.   Cardiovascular: Negative.   Gastrointestinal: Negative.   Endocrine: Negative.   Genitourinary: Negative.   Musculoskeletal: Positive for back pain.  Skin: Negative.   Allergic/Immunologic: Negative.   Neurological: Negative.   Hematological: Negative.   Psychiatric/Behavioral: Negative.   All other systems reviewed and are negative.      Objective:   Physical Exam  Constitutional: She is oriented to person, place, and time. She appears well-developed and well-nourished.  HENT:  Head: Normocephalic and atraumatic.  Neck: Normal range of motion. Neck supple.  Cardiovascular: Normal rate and regular rhythm.  Pulmonary/Chest: Effort normal and breath sounds normal.  Musculoskeletal:  Normal Muscle Bulk and Muscle Testing Reveals: Upper Extremities: Full ROM and Muscle Strength 5/5 No Thoracic Paraspinal Tenderness Lumbar Paraspinal Tenderness: L-4-L-5 Mainly Left Side Lower Extremities: Full ROM and Muscle Strength 5/5 Arises from Table with ease Narrow Based Gait  Neurological: She is alert and oriented to person, place, and time.  Skin: Skin is warm and dry.  Psychiatric: She has a normal mood and affect.  Nursing note and vitals reviewed.         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 #120 We will continue the opioid monitoring program, this consists of regular clinic visits, examinations, urine drug screen, pill counts as well as use of [place]. 2. Left Greater Trochanteric Tenderness:  No Complaints Today Continue with HEP and Heat and Ice Therapy.05/25/2017  20 minutes of face to face patient care time was spent during this visit. All questions were encouraged and answered.  F/U in 1 month

## 2017-06-22 ENCOUNTER — Encounter: Payer: Self-pay | Admitting: Registered Nurse

## 2017-06-22 ENCOUNTER — Encounter: Payer: 59 | Attending: Physical Medicine & Rehabilitation | Admitting: Registered Nurse

## 2017-06-22 ENCOUNTER — Other Ambulatory Visit: Payer: Self-pay

## 2017-06-22 VITALS — BP 108/73 | HR 95

## 2017-06-22 DIAGNOSIS — Z87891 Personal history of nicotine dependence: Secondary | ICD-10-CM | POA: Insufficient documentation

## 2017-06-22 DIAGNOSIS — G894 Chronic pain syndrome: Secondary | ICD-10-CM

## 2017-06-22 DIAGNOSIS — E039 Hypothyroidism, unspecified: Secondary | ICD-10-CM | POA: Diagnosis not present

## 2017-06-22 DIAGNOSIS — I10 Essential (primary) hypertension: Secondary | ICD-10-CM | POA: Insufficient documentation

## 2017-06-22 DIAGNOSIS — Z833 Family history of diabetes mellitus: Secondary | ICD-10-CM | POA: Insufficient documentation

## 2017-06-22 DIAGNOSIS — M545 Low back pain: Secondary | ICD-10-CM

## 2017-06-22 DIAGNOSIS — K219 Gastro-esophageal reflux disease without esophagitis: Secondary | ICD-10-CM | POA: Diagnosis not present

## 2017-06-22 DIAGNOSIS — M259 Joint disorder, unspecified: Secondary | ICD-10-CM | POA: Diagnosis not present

## 2017-06-22 DIAGNOSIS — M1288 Other specific arthropathies, not elsewhere classified, other specified site: Secondary | ICD-10-CM | POA: Diagnosis not present

## 2017-06-22 DIAGNOSIS — G8929 Other chronic pain: Secondary | ICD-10-CM | POA: Diagnosis not present

## 2017-06-22 DIAGNOSIS — Z5181 Encounter for therapeutic drug level monitoring: Secondary | ICD-10-CM

## 2017-06-22 DIAGNOSIS — M519 Unspecified thoracic, thoracolumbar and lumbosacral intervertebral disc disorder: Secondary | ICD-10-CM | POA: Insufficient documentation

## 2017-06-22 DIAGNOSIS — Z8249 Family history of ischemic heart disease and other diseases of the circulatory system: Secondary | ICD-10-CM | POA: Diagnosis not present

## 2017-06-22 DIAGNOSIS — Z8261 Family history of arthritis: Secondary | ICD-10-CM | POA: Diagnosis not present

## 2017-06-22 DIAGNOSIS — Z8619 Personal history of other infectious and parasitic diseases: Secondary | ICD-10-CM | POA: Insufficient documentation

## 2017-06-22 DIAGNOSIS — Z79899 Other long term (current) drug therapy: Secondary | ICD-10-CM | POA: Diagnosis not present

## 2017-06-22 DIAGNOSIS — M47816 Spondylosis without myelopathy or radiculopathy, lumbar region: Secondary | ICD-10-CM

## 2017-06-22 DIAGNOSIS — F4311 Post-traumatic stress disorder, acute: Secondary | ICD-10-CM | POA: Insufficient documentation

## 2017-06-22 DIAGNOSIS — Z7901 Long term (current) use of anticoagulants: Secondary | ICD-10-CM | POA: Insufficient documentation

## 2017-06-22 DIAGNOSIS — M7062 Trochanteric bursitis, left hip: Secondary | ICD-10-CM | POA: Diagnosis not present

## 2017-06-22 MED ORDER — TAPENTADOL HCL 75 MG PO TABS: 75.0000 mg | ORAL_TABLET | Freq: Four times a day (QID) | ORAL | 0 refills | Status: DC | PRN

## 2017-06-22 NOTE — Progress Notes (Signed)
Subjective:    Patient ID: Suzanne Villanueva, female    DOB: [birth year], 37 y.o.   MRN: [id number]  HPI: Suzanne Villanueva is a 37 year old female who returns for follow up appointment and medication refill. She states her pain is located in her mid- lower  Back radiating into her left hip. She rates her pain 3. Her current exercise regime is walking.   Suzanne Villanueva Morphine equivalent is 120.00 MME.   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  Pain Inventory Average Pain 5 Pain Right Now 3 My pain is burning, dull and aching  In the last 24 hours, has pain interfered with the following? General activity 5 Relation with others 4 Enjoyment of life 5 What TIME of day is your pain at its worst? daytime Sleep (in general) Fair  Pain is worse with: inactivity Pain improves with: therapy/exercise, medication and TENS Relief from Meds: 5  Mobility walk without assistance ability to climb steps?  yes do you drive?  yes Do you have any goals in this area?  no  Function Do you have any goals in this area?  no  Neuro/Psych No problems in this area  Prior Studies Any changes since last visit?  no  Physicians involved in your care Any changes since last visit?  no   Family History  Problem Relation Age of Onset  . Heart attack Father   . Heart disease Unknown        parents  . Hypertension Unknown        parents  . Arthritis Unknown        grandparents  . Diabetes Unknown        parents   Social History   Socioeconomic History  . Marital status: Married    Spouse name: None  . Number of children: None  . Years of education: None  . Highest education level: None  Social Needs  . Financial resource strain: None  . Food insecurity - worry: None  . Food insecurity - inability: None  . Transportation needs - medical: None  . Transportation needs - non-medical: None  Occupational History  . None  Tobacco Use  . Smoking status: Former Smoker    Packs/day: 0.50   Last attempt to quit: 01/14/2011    Years since quitting: 6.4  . Smokeless tobacco: Never Used  Substance and Sexual Activity  . Alcohol use: No  . Drug use: No  . Sexual activity: Yes    Birth control/protection: None  Other Topics Concern  . None  Social History Narrative  . None   Past Surgical History:  Procedure Laterality Date  . CESAREAN SECTION  09/14/2011   Procedure: CESAREAN SECTION;  Surgeon: Freddrick March. Tenny Craw, MD;  Location: [facility];  Service: Gynecology;  Laterality: N/A;  Primary Cesarean Section with birth of baby girl @ 2302  . MYRINGOTOMY    . TONSILLECTOMY  1990  . TYMPANOSTOMY TUBE PLACEMENT     '90, '96, '12  . WISDOM TOOTH EXTRACTION     Past Medical History:  Diagnosis Date  . Abnormal Pap smear   . Abscess    behind the right ear  . Chicken pox   . Complication of anesthesia    Getsvery emotional  . Depression   . GERD (gastroesophageal reflux disease)   . Heart murmur   . History of CHF (congestive heart failure)    post partum 09/2011 - echo 09/21/11: EF 60-65%, mod MR, mild  LAE (due to post partum fluid shifts, HTN, obesity, ?MR)  . Hypertension   . Hypothyroidism   . Migraines   . Moderate mitral regurgitation    needs echo 03/2012  . MRSA (methicillin resistant staph aureus) culture positive   . PTSD (post-traumatic stress disorder)   . Scarlet fever x 2  . Seasonal allergies   . Urinary tract infection    BP 108/73   Pulse 95   SpO2 95%   Opioid Risk Score:  2 Fall Risk Score:  `1  Depression screen PHQ 2/9  Depression screen [facility] 2/9 06/22/2017 04/24/2017 02/22/2017 01/25/2017 12/28/2016 09/07/2016 03/23/2016  Decreased Interest 0 0 0 0 0 0 1  Down, Depressed, Hopeless 0 0 0 0 0 0 0  PHQ - 2 Score 0 0 0 0 0 0 1  Altered sleeping - - - - - 0 2  Tired, decreased energy - - - - - 0 1  Change in appetite - - - - - 0 0  Feeling bad or failure about yourself  - - - - - 0 0  Trouble concentrating - - - - - 0 0  Moving slowly or fidgety/restless - -  - - - 0 0  Suicidal thoughts - - - - - 0 0  PHQ-9 Score - - - - - 0 4  Difficult doing work/chores - - - - - - Not difficult at all     Review of Systems  Constitutional: Negative.   HENT: Negative.   Eyes: Negative.   Respiratory: Negative.   Cardiovascular: Negative.   Gastrointestinal: Negative.   Endocrine: Negative.   Genitourinary: Negative.   Musculoskeletal: Positive for back pain.  Skin: Negative.   Allergic/Immunologic: Negative.   Neurological: Negative.   Hematological: Negative.   Psychiatric/Behavioral: Negative.   All other systems reviewed and are negative.      Objective:   Physical Exam  Constitutional: She is oriented to person, place, and time. She appears well-developed and well-nourished.  HENT:  Head: Normocephalic and atraumatic.  Neck: Normal range of motion. Neck supple.  Cardiovascular: Normal rate and regular rhythm.  Pulmonary/Chest: Effort normal and breath sounds normal.  Musculoskeletal:  Normal Muscle Bulk and Muscle Testing Reveals: Upper Extremities: Full ROM and Muscle Strength 5/5 Lumbar Paraspinal Tenderness: L-4-L-5  Left Greater Trochanter Tenderness Lower Extremities: Full ROM and Muscle Strength 5/5 Arises from Table with ease Narrow Based Gait  Neurological: She is alert and oriented to person, place, and time.  Skin: Skin is warm and dry.  Psychiatric: She has a normal mood and affect.  Nursing note and vitals reviewed.         Assessment & Plan:  1.Thoracic degenerative disc as well as lumbar spondylosis.06/22/2017 Refilled Nucynta 75 mg one tablet every 6 hours as needed. #120 We will continue the opioid monitoring program, this consists of regular clinic visits, examinations, urine drug screen, pill counts as well as use of [place] Controlled Substance Reporting System. 2. Left Greater Trochanteric Tenderness:  Continue with HEP and Heat and Ice Therapy.06/22/2017  20 minutes of face to face patient care  time was spent during this visit. All questions were encouraged and answered.  F/U in 1 month

## 2017-06-27 ENCOUNTER — Telehealth: Payer: Self-pay | Admitting: *Deleted

## 2017-06-27 NOTE — Telephone Encounter (Signed)
Oral swab drug screen was consistent for prescribed medications.  ?

## 2017-06-28 LAB — DRUG TOX MONITOR 1 W/CONF, ORAL FLD
AMPHETAMINES: NEGATIVE ng/mL (ref <10)
BENZODIAZEPINES: NEGATIVE ng/mL (ref <0.50)
BUPRENORPHINE: NEGATIVE ng/mL (ref <0.10)
Barbiturates: NEGATIVE ng/mL (ref <10)
Cocaine: NEGATIVE ng/mL (ref <5.0)
Cotinine: 37.1 ng/mL — ABNORMAL HIGH (ref <5.0)
Fentanyl: NEGATIVE ng/mL (ref <0.10)
Heroin Metabolite: NEGATIVE ng/mL (ref <1.0)
MARIJUANA: NEGATIVE ng/mL (ref <2.5)
MDMA: NEGATIVE ng/mL (ref <10)
MEPROBAMATE: NEGATIVE ng/mL (ref <2.5)
METHADONE: NEGATIVE ng/mL (ref <5.0)
Nicotine Metabolite: POSITIVE ng/mL — AB (ref <5.0)
Opiates: NEGATIVE ng/mL (ref <2.5)
Phencyclidine: NEGATIVE ng/mL (ref <10)
TRAMADOL: NEGATIVE ng/mL (ref <5.0)
Tapentadol: 38.6 ng/mL — ABNORMAL HIGH (ref <5.0)
Tapentadol: POSITIVE ng/mL — AB (ref <5.0)
ZOLPIDEM: NEGATIVE ng/mL (ref <5.0)

## 2017-06-28 LAB — DRUG TOX ALC METAB W/CON, ORAL FLD: ALCOHOL METABOLITE: NEGATIVE ng/mL (ref <25)

## 2017-07-30 ENCOUNTER — Encounter: Payer: 59 | Attending: Physical Medicine & Rehabilitation | Admitting: Registered Nurse

## 2017-07-30 ENCOUNTER — Other Ambulatory Visit: Payer: Self-pay

## 2017-07-30 ENCOUNTER — Encounter: Payer: Self-pay | Admitting: Registered Nurse

## 2017-07-30 VITALS — BP 145/93 | HR 74 | Resp 99

## 2017-07-30 DIAGNOSIS — Z8261 Family history of arthritis: Secondary | ICD-10-CM | POA: Insufficient documentation

## 2017-07-30 DIAGNOSIS — Z7901 Long term (current) use of anticoagulants: Secondary | ICD-10-CM | POA: Insufficient documentation

## 2017-07-30 DIAGNOSIS — M519 Unspecified thoracic, thoracolumbar and lumbosacral intervertebral disc disorder: Secondary | ICD-10-CM | POA: Insufficient documentation

## 2017-07-30 DIAGNOSIS — M545 Low back pain: Secondary | ICD-10-CM

## 2017-07-30 DIAGNOSIS — E039 Hypothyroidism, unspecified: Secondary | ICD-10-CM | POA: Diagnosis not present

## 2017-07-30 DIAGNOSIS — G894 Chronic pain syndrome: Secondary | ICD-10-CM

## 2017-07-30 DIAGNOSIS — Z8619 Personal history of other infectious and parasitic diseases: Secondary | ICD-10-CM | POA: Insufficient documentation

## 2017-07-30 DIAGNOSIS — Z87891 Personal history of nicotine dependence: Secondary | ICD-10-CM | POA: Diagnosis not present

## 2017-07-30 DIAGNOSIS — I10 Essential (primary) hypertension: Secondary | ICD-10-CM | POA: Diagnosis not present

## 2017-07-30 DIAGNOSIS — M7062 Trochanteric bursitis, left hip: Secondary | ICD-10-CM | POA: Diagnosis not present

## 2017-07-30 DIAGNOSIS — G8929 Other chronic pain: Secondary | ICD-10-CM | POA: Insufficient documentation

## 2017-07-30 DIAGNOSIS — M259 Joint disorder, unspecified: Secondary | ICD-10-CM | POA: Diagnosis not present

## 2017-07-30 DIAGNOSIS — Z8249 Family history of ischemic heart disease and other diseases of the circulatory system: Secondary | ICD-10-CM | POA: Insufficient documentation

## 2017-07-30 DIAGNOSIS — M1288 Other specific arthropathies, not elsewhere classified, other specified site: Secondary | ICD-10-CM | POA: Insufficient documentation

## 2017-07-30 DIAGNOSIS — Z833 Family history of diabetes mellitus: Secondary | ICD-10-CM | POA: Insufficient documentation

## 2017-07-30 DIAGNOSIS — Z5181 Encounter for therapeutic drug level monitoring: Secondary | ICD-10-CM | POA: Diagnosis not present

## 2017-07-30 DIAGNOSIS — Z79899 Other long term (current) drug therapy: Secondary | ICD-10-CM | POA: Diagnosis not present

## 2017-07-30 DIAGNOSIS — F4311 Post-traumatic stress disorder, acute: Secondary | ICD-10-CM | POA: Diagnosis not present

## 2017-07-30 DIAGNOSIS — M47816 Spondylosis without myelopathy or radiculopathy, lumbar region: Secondary | ICD-10-CM

## 2017-07-30 DIAGNOSIS — K219 Gastro-esophageal reflux disease without esophagitis: Secondary | ICD-10-CM | POA: Diagnosis not present

## 2017-07-30 MED ORDER — METHYLPREDNISOLONE 4 MG PO TBPK: ORAL_TABLET | ORAL | 0 refills | Status: DC

## 2017-07-30 MED ORDER — TAPENTADOL HCL 75 MG PO TABS: 75.0000 mg | ORAL_TABLET | Freq: Four times a day (QID) | ORAL | 0 refills | Status: DC | PRN

## 2017-07-30 NOTE — Progress Notes (Signed)
Subjective:    Patient ID: Suzanne Villanueva, female    DOB: 07-22-1980, 37 y.o.   MRN: [id number]  HPI: Suzanne Villanueva is a 37 year old female who returns for follow up appointment and medication refill. She states her pain is located in her mid-lower back and left hip. She rates her pain 3. Her current exercise regime is walking.   Suzanne Villanueva Morphine equivalent is 116.00 MME.   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  Pain Inventory Average Pain 6 Pain Right Now 3 My pain is burning, dull and aching  In the last 24 hours, has pain interfered with the following? General activity 4 Relation with others 3 Enjoyment of life 3 What TIME of day is your pain at its worst? daytime Sleep (in general) Fair  Pain is worse with: inactivity Pain improves with: therapy/exercise, medication and TENS Relief from Meds: 5  Mobility walk without assistance ability to climb steps?  yes do you drive?  yes Do you have any goals in this area?  no  Function Do you have any goals in this area?  no  Neuro/Psych No problems in this area  Prior Studies Any changes since last visit?  no  Physicians involved in your care Any changes since last visit?  no   Family History  Problem Relation Age of Onset  . Heart attack Father   . Heart disease Unknown        parents  . Hypertension Unknown        parents  . Arthritis Unknown        grandparents  . Diabetes Unknown        parents   Social History   Socioeconomic History  . Marital status: Married    Spouse name: None  . Number of children: None  . Years of education: None  . Highest education level: None  Social Needs  . Financial resource strain: None  . Food insecurity - worry: None  . Food insecurity - inability: None  . Transportation needs - medical: None  . Transportation needs - non-medical: None  Occupational History  . None  Tobacco Use  . Smoking status: Former Smoker    Packs/day: 0.50    Last attempt to  quit: 01/14/2011    Years since quitting: 6.5  . Smokeless tobacco: Never Used  Substance and Sexual Activity  . Alcohol use: No  . Drug use: No  . Sexual activity: Yes    Birth control/protection: None  Other Topics Concern  . None  Social History Narrative  . None   Past Surgical History:  Procedure Laterality Date  . CESAREAN SECTION  09/14/2011   Procedure: CESAREAN SECTION;  Surgeon: Freddrick March. Tenny Craw, MD;  Location: [facility];  Service: Gynecology;  Laterality: N/A;  Primary Cesarean Section with birth of baby girl @ 2302  . MYRINGOTOMY    . TONSILLECTOMY  1990  . TYMPANOSTOMY TUBE PLACEMENT     '90, '96, '12  . WISDOM TOOTH EXTRACTION     Past Medical History:  Diagnosis Date  . Abnormal Pap smear   . Abscess    behind the right ear  . Chicken pox   . Complication of anesthesia    Getsvery emotional  . Depression   . GERD (gastroesophageal reflux disease)   . Heart murmur   . History of CHF (congestive heart failure)    post partum 09/2011 - echo 09/21/11: EF 60-65%, mod MR, mild LAE (due  to post partum fluid shifts, HTN, obesity, ?MR)  . Hypertension   . Hypothyroidism   . Migraines   . Moderate mitral regurgitation    needs echo 03/2012  . MRSA (methicillin resistant staph aureus) culture positive   . PTSD (post-traumatic stress disorder)   . Scarlet fever x 2  . Seasonal allergies   . Urinary tract infection    BP (!) 145/93   Pulse 74   Resp (!) 99   Opioid Risk Score:  2 Fall Risk Score:  `1  Depression screen PHQ 2/9  Depression screen [facility] 2/9 07/30/2017 06/22/2017 04/24/2017 02/22/2017 01/25/2017 12/28/2016 09/07/2016  Decreased Interest 0 0 0 0 0 0 0  Down, Depressed, Hopeless 0 0 0 0 0 0 0  PHQ - 2 Score 0 0 0 0 0 0 0  Altered sleeping - - - - - - 0  Tired, decreased energy - - - - - - 0  Change in appetite - - - - - - 0  Feeling bad or failure about yourself  - - - - - - 0  Trouble concentrating - - - - - - 0  Moving slowly or fidgety/restless - - - - - -  0  Suicidal thoughts - - - - - - 0  PHQ-9 Score - - - - - - 0  Difficult doing work/chores - - - - - - -     Review of Systems  Constitutional: Negative.   HENT: Negative.   Eyes: Negative.   Respiratory: Negative.   Cardiovascular: Negative.   Gastrointestinal: Negative.   Endocrine: Negative.   Genitourinary: Negative.   Musculoskeletal: Positive for back pain.  Skin: Negative.   Allergic/Immunologic: Negative.   Neurological: Negative.   Hematological: Negative.   Psychiatric/Behavioral: Negative.   All other systems reviewed and are negative.      Objective:   Physical Exam  Constitutional: She is oriented to person, place, and time. She appears well-developed and well-nourished.  HENT:  Head: Normocephalic and atraumatic.  Neck: Normal range of motion. Neck supple.  Cardiovascular: Normal rate and regular rhythm.  Pulmonary/Chest: Effort normal and breath sounds normal.  Musculoskeletal:  Normal Muscle Bulk and Muscle Testing Reveals: Upper Extremities: Full ROM and Muscle Strength 5/5 Lumbar Paraspinal Tenderness: L-4-L-5  Left:Greater Trochanter Tenderness Lower Extremities: Full ROM and Muscle Strength 5/5 Arises from Table with ease Narrow Based Gait  Neurological: She is alert and oriented to person, place, and time.  Skin: Skin is warm and dry.  Psychiatric: She has a normal mood and affect.  Nursing note and vitals reviewed.         Assessment & Plan:  1.Thoracic degenerative disc as well as lumbar spondylosis.07/30/2017 Refilled Nucynta 75 mg one tablet every 6 hours as needed. #120 We will continue the opioid monitoring program, this consists of regular clinic visits, examinations, urine drug screen, pill counts as well as use of [place] Controlled Substance Reporting System. 2. Left Greater Trochanteric Tenderness:  Continue with HEP and Heat and Ice Therapy.07/30/2017  20 minutes of face to face patient care time was spent during this  visit. All questions were encouraged and answered.  F/U in 1 month

## 2017-08-17 LAB — HM PAP SMEAR

## 2017-08-20 ENCOUNTER — Encounter: Payer: Self-pay | Admitting: General Practice

## 2017-09-10 ENCOUNTER — Ambulatory Visit: Payer: 59 | Admitting: Registered Nurse

## 2017-09-20 ENCOUNTER — Encounter: Payer: 59 | Attending: Physical Medicine & Rehabilitation | Admitting: Registered Nurse

## 2017-09-20 ENCOUNTER — Encounter: Payer: Self-pay | Admitting: Registered Nurse

## 2017-09-20 ENCOUNTER — Encounter: Payer: Self-pay | Admitting: Family Medicine

## 2017-09-20 ENCOUNTER — Other Ambulatory Visit: Payer: Self-pay

## 2017-09-20 VITALS — BP 121/83 | HR 74 | Ht 68.0 in | Wt 284.0 lb

## 2017-09-20 DIAGNOSIS — Z8619 Personal history of other infectious and parasitic diseases: Secondary | ICD-10-CM | POA: Insufficient documentation

## 2017-09-20 DIAGNOSIS — Z87891 Personal history of nicotine dependence: Secondary | ICD-10-CM | POA: Insufficient documentation

## 2017-09-20 DIAGNOSIS — M62830 Muscle spasm of back: Secondary | ICD-10-CM | POA: Diagnosis not present

## 2017-09-20 DIAGNOSIS — Z5181 Encounter for therapeutic drug level monitoring: Secondary | ICD-10-CM

## 2017-09-20 DIAGNOSIS — I10 Essential (primary) hypertension: Secondary | ICD-10-CM | POA: Diagnosis not present

## 2017-09-20 DIAGNOSIS — F4311 Post-traumatic stress disorder, acute: Secondary | ICD-10-CM | POA: Diagnosis not present

## 2017-09-20 DIAGNOSIS — M1288 Other specific arthropathies, not elsewhere classified, other specified site: Secondary | ICD-10-CM | POA: Diagnosis not present

## 2017-09-20 DIAGNOSIS — Z7901 Long term (current) use of anticoagulants: Secondary | ICD-10-CM | POA: Insufficient documentation

## 2017-09-20 DIAGNOSIS — M259 Joint disorder, unspecified: Secondary | ICD-10-CM | POA: Insufficient documentation

## 2017-09-20 DIAGNOSIS — Z8249 Family history of ischemic heart disease and other diseases of the circulatory system: Secondary | ICD-10-CM | POA: Diagnosis not present

## 2017-09-20 DIAGNOSIS — E039 Hypothyroidism, unspecified: Secondary | ICD-10-CM | POA: Diagnosis not present

## 2017-09-20 DIAGNOSIS — Z79899 Other long term (current) drug therapy: Secondary | ICD-10-CM | POA: Diagnosis not present

## 2017-09-20 DIAGNOSIS — Z8261 Family history of arthritis: Secondary | ICD-10-CM | POA: Diagnosis not present

## 2017-09-20 DIAGNOSIS — M47816 Spondylosis without myelopathy or radiculopathy, lumbar region: Secondary | ICD-10-CM | POA: Diagnosis not present

## 2017-09-20 DIAGNOSIS — G8929 Other chronic pain: Secondary | ICD-10-CM | POA: Diagnosis not present

## 2017-09-20 DIAGNOSIS — Z833 Family history of diabetes mellitus: Secondary | ICD-10-CM | POA: Diagnosis not present

## 2017-09-20 DIAGNOSIS — K219 Gastro-esophageal reflux disease without esophagitis: Secondary | ICD-10-CM | POA: Diagnosis not present

## 2017-09-20 DIAGNOSIS — G894 Chronic pain syndrome: Secondary | ICD-10-CM

## 2017-09-20 DIAGNOSIS — M519 Unspecified thoracic, thoracolumbar and lumbosacral intervertebral disc disorder: Secondary | ICD-10-CM | POA: Diagnosis not present

## 2017-09-20 MED ORDER — LEVOTHYROXINE SODIUM 150 MCG PO TABS: 150.0000 ug | ORAL_TABLET | Freq: Every day | ORAL | 0 refills | Status: DC

## 2017-09-20 MED ORDER — TAPENTADOL HCL 75 MG PO TABS: 75.0000 mg | ORAL_TABLET | Freq: Four times a day (QID) | ORAL | 0 refills | Status: DC | PRN

## 2017-09-20 MED ORDER — LOSARTAN POTASSIUM-HCTZ 100-25 MG PO TABS
1.0000 | ORAL_TABLET | Freq: Every day | ORAL | 0 refills | Status: DC
Start: 2017-09-20 — End: 2017-10-18

## 2017-09-20 MED ORDER — CYCLOBENZAPRINE HCL 5 MG PO TABS: 5.0000 mg | ORAL_TABLET | Freq: Every evening | ORAL | 1 refills | Status: DC | PRN

## 2017-09-20 NOTE — Progress Notes (Signed)
Subjective:    Patient ID: Suzanne Villanueva, female    DOB: 1980-07-01, 37 y.o.   MRN: [id number]  HPI: [name] is a 37 year old female who returns for follow up appointment and medication refill. She states her pain is located in her mid- lower back, also report increase frequency of muscle spasm in her back. She rates her pain 4. Her current exercise regime is walking.   [name] Morphine Equivalent is 116.00 MME.  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   Pain Inventory Average Pain 6 Pain Right Now 4 My pain is burning, dull and aching  In the last 24 hours, has pain interfered with the following? General activity 3 Relation with others 3 Enjoyment of life 4 What TIME of day is your pain at its worst? morning and daytime Sleep (in general) Fair  Pain is worse with: inactivity and some activites Pain improves with: rest, therapy/exercise, medication and TENS Relief from Meds: 5  Mobility Do you have any goals in this area?  no  Function Do you have any goals in this area?  no  Neuro/Psych No problems in this area  Prior Studies Any changes since last visit?  no  Physicians involved in your care Any changes since last visit?  no   Family History  Problem Relation Age of Onset  . Heart attack Father   . Heart disease Unknown        parents  . Hypertension Unknown        parents  . Arthritis Unknown        grandparents  . Diabetes Unknown        parents   Social History   Socioeconomic History  . Marital status: Married    Spouse name: Not on file  . Number of children: Not on file  . Years of education: Not on file  . Highest education level: Not on file  Occupational History  . Not on file  Social Needs  . Financial resource strain: Not on file  . Food insecurity:    Worry: Not on file    Inability: Not on file  . Transportation needs:    Medical: Not on file    Non-medical: Not on file  Tobacco Use  . Smoking status: Former  Smoker    Packs/day: 0.50    Last attempt to quit: 01/14/2011    Years since quitting: 6.6  . Smokeless tobacco: Never Used  Substance and Sexual Activity  . Alcohol use: No  . Drug use: No  . Sexual activity: Yes    Birth control/protection: None  Lifestyle  . Physical activity:    Days per week: Not on file    Minutes per session: Not on file  . Stress: Not on file  Relationships  . Social connections:    Talks on phone: Not on file    Gets together: Not on file    Attends religious service: Not on file    Active member of club or organization: Not on file    Attends meetings of clubs or organizations: Not on file    Relationship status: Not on file  Other Topics Concern  . Not on file  Social History Narrative  . Not on file   Past Surgical History:  Procedure Laterality Date  . CESAREAN SECTION  09/14/2011   Procedure: CESAREAN SECTION;  Surgeon: Freddrick March. Tenny Craw, MD;  Location: [facility];  Service: Gynecology;  Laterality: N/A;  Primary Cesarean Section with birth of baby girl @ 2302  . MYRINGOTOMY    . TONSILLECTOMY  1990  . TYMPANOSTOMY TUBE PLACEMENT     '90, '96, '12  . WISDOM TOOTH EXTRACTION     Past Medical History:  Diagnosis Date  . Abnormal Pap smear   . Abscess    behind the right ear  . Chicken pox   . Complication of anesthesia    Getsvery emotional  . Depression   . GERD (gastroesophageal reflux disease)   . Heart murmur   . History of CHF (congestive heart failure)    post partum 09/2011 - echo 09/21/11: EF 60-65%, mod MR, mild LAE (due to post partum fluid shifts, HTN, obesity, ?MR)  . Hypertension   . Hypothyroidism   . Migraines   . Moderate mitral regurgitation    needs echo 03/2012  . MRSA (methicillin resistant staph aureus) culture positive   . PTSD (post-traumatic stress disorder)   . Scarlet fever x 2  . Seasonal allergies   . Urinary tract infection    BP 121/83   Pulse 74   Ht 5\' 8"  (1.727 m) Comment: pt reported  Wt 284 lb (128.8  kg)   SpO2 99%   BMI 43.18 kg/m   Opioid Risk Score:   Fall Risk Score:  `1  Depression screen PHQ 2/9  Depression screen [facility] 2/9 09/20/2017 07/30/2017 06/22/2017 04/24/2017 02/22/2017 01/25/2017 12/28/2016  Decreased Interest 0 0 0 0 0 0 0  Down, Depressed, Hopeless 0 0 0 0 0 0 0  PHQ - 2 Score 0 0 0 0 0 0 0  Altered sleeping - - - - - - -  Tired, decreased energy - - - - - - -  Change in appetite - - - - - - -  Feeling bad or failure about yourself  - - - - - - -  Trouble concentrating - - - - - - -  Moving slowly or fidgety/restless - - - - - - -  Suicidal thoughts - - - - - - -  PHQ-9 Score - - - - - - -  Difficult doing work/chores - - - - - - -    Review of Systems  Constitutional: Negative.   HENT: Negative.   Eyes: Negative.   Respiratory: Negative.   Cardiovascular: Negative.   Gastrointestinal: Negative.   Endocrine: Negative.   Genitourinary: Negative.   Musculoskeletal: Negative.   Skin: Negative.   Allergic/Immunologic: Negative.   Neurological: Negative.   Hematological: Negative.   Psychiatric/Behavioral: Negative.   All other systems reviewed and are negative.      Objective:   Physical Exam  Constitutional: She is oriented to person, place, and time. She appears well-developed and well-nourished.  Morbid Obesity  HENT:  Head: Normocephalic and atraumatic.  Neck: Normal range of motion. Neck supple.  Cardiovascular: Normal rate and regular rhythm.  Pulmonary/Chest: Effort normal and breath sounds normal.  Musculoskeletal:  Normal Muscle Bulk and Muscle Testing Reveals: Upper Extremities: Full ROM and Muscle Strength 5/5 Thoracic Paraspinal Tenderness: T-11-T-12 Lumbar Paraspinal Tenderness: L-3-L-5 Lower Extremities: Full ROM and Muscle Strength 5/5 Arises from Table with ease Narrow Based Gait  Neurological: She is alert and oriented to person, place, and time.  Skin: Skin is warm and dry.  Psychiatric: She has a normal mood and affect.  Nursing  note and vitals reviewed.         Assessment & Plan:  1.Thoracic degenerative disc as  well as lumbar spondylosis.Continue current medication regime and continue HEP as Tolerated. 09/20/2017. Refilled: Continue current treatment regimen: Nucynta 75 mg one tablet every 6 hours as needed. #120 We will continue the opioid monitoring program, this consists of regular clinic visits, examinations, urine drug screen, pill counts as well as use of [place] Controlled Substance Reporting System. 2. Left Greater Trochanteric Tenderness: No complaints today. Continue with HEP and Heat and Ice Therapy.09/20/2017 3.Muscle Spasm: RX: Flexeril.   20 minutes of face to face patient care time was spent during this visit. All questions were encouraged and answered.  F/U in 1 month

## 2017-10-18 ENCOUNTER — Other Ambulatory Visit: Payer: Self-pay | Admitting: Family Medicine

## 2017-10-18 ENCOUNTER — Encounter: Payer: 59 | Attending: Physical Medicine & Rehabilitation | Admitting: Registered Nurse

## 2017-10-18 ENCOUNTER — Encounter: Payer: Self-pay | Admitting: Registered Nurse

## 2017-10-18 VITALS — BP 124/84 | HR 78 | Resp 14 | Ht 68.0 in | Wt 284.0 lb

## 2017-10-18 DIAGNOSIS — Z8619 Personal history of other infectious and parasitic diseases: Secondary | ICD-10-CM | POA: Insufficient documentation

## 2017-10-18 DIAGNOSIS — Z5181 Encounter for therapeutic drug level monitoring: Secondary | ICD-10-CM | POA: Diagnosis not present

## 2017-10-18 DIAGNOSIS — M1288 Other specific arthropathies, not elsewhere classified, other specified site: Secondary | ICD-10-CM | POA: Insufficient documentation

## 2017-10-18 DIAGNOSIS — Z8249 Family history of ischemic heart disease and other diseases of the circulatory system: Secondary | ICD-10-CM | POA: Diagnosis not present

## 2017-10-18 DIAGNOSIS — G8929 Other chronic pain: Secondary | ICD-10-CM | POA: Insufficient documentation

## 2017-10-18 DIAGNOSIS — G894 Chronic pain syndrome: Secondary | ICD-10-CM

## 2017-10-18 DIAGNOSIS — K219 Gastro-esophageal reflux disease without esophagitis: Secondary | ICD-10-CM | POA: Insufficient documentation

## 2017-10-18 DIAGNOSIS — Z7901 Long term (current) use of anticoagulants: Secondary | ICD-10-CM | POA: Insufficient documentation

## 2017-10-18 DIAGNOSIS — Z8261 Family history of arthritis: Secondary | ICD-10-CM | POA: Insufficient documentation

## 2017-10-18 DIAGNOSIS — E039 Hypothyroidism, unspecified: Secondary | ICD-10-CM | POA: Insufficient documentation

## 2017-10-18 DIAGNOSIS — M259 Joint disorder, unspecified: Secondary | ICD-10-CM | POA: Diagnosis not present

## 2017-10-18 DIAGNOSIS — Z87891 Personal history of nicotine dependence: Secondary | ICD-10-CM | POA: Diagnosis not present

## 2017-10-18 DIAGNOSIS — M47816 Spondylosis without myelopathy or radiculopathy, lumbar region: Secondary | ICD-10-CM

## 2017-10-18 DIAGNOSIS — Z79899 Other long term (current) drug therapy: Secondary | ICD-10-CM

## 2017-10-18 DIAGNOSIS — M62830 Muscle spasm of back: Secondary | ICD-10-CM | POA: Diagnosis not present

## 2017-10-18 DIAGNOSIS — F4311 Post-traumatic stress disorder, acute: Secondary | ICD-10-CM | POA: Insufficient documentation

## 2017-10-18 DIAGNOSIS — Z833 Family history of diabetes mellitus: Secondary | ICD-10-CM | POA: Diagnosis not present

## 2017-10-18 DIAGNOSIS — I10 Essential (primary) hypertension: Secondary | ICD-10-CM | POA: Insufficient documentation

## 2017-10-18 DIAGNOSIS — M519 Unspecified thoracic, thoracolumbar and lumbosacral intervertebral disc disorder: Secondary | ICD-10-CM | POA: Diagnosis not present

## 2017-10-18 MED ORDER — TAPENTADOL HCL 75 MG PO TABS: 75.0000 mg | ORAL_TABLET | Freq: Four times a day (QID) | ORAL | 0 refills | Status: DC | PRN

## 2017-10-18 NOTE — Progress Notes (Signed)
Subjective:    Patient ID: Suzanne Villanueva, female    DOB: 06/10/1981, 37 y.o.   MRN: [id number]  HPI: [name] is a 37 year old  female who returns for follow up appointment for chronic pain and medication refill. She states her pain is located in her mid-lower back. She rates her pain 4 . Her current exercise regime is walking and lifting light weights daily.   [name] Morphine Equivalent is 112.00 MME.  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      Pain Inventory Average Pain 6 Pain Right Now 4 My pain is burning, dull and aching  In the last 24 hours, has pain interfered with the following? General activity 4 Relation with others 3 Enjoyment of life 5 What TIME of day is your pain at its worst? daytime Sleep (in general) Fair  Pain is worse with: inactivity and some activites Pain improves with: rest, heat/ice, therapy/exercise, pacing activities, medication and TENS Relief from Meds: 7  Mobility Do you have any goals in this area?  no  Function Do you have any goals in this area?  no  Neuro/Psych No problems in this area  Prior Studies Any changes since last visit?  no  Physicians involved in your care Any changes since last visit?  no   Family History  Problem Relation Age of Onset  . Heart attack Father   . Heart disease Unknown        parents  . Hypertension Unknown        parents  . Arthritis Unknown        grandparents  . Diabetes Unknown        parents   Social History   Socioeconomic History  . Marital status: Married    Spouse name: Not on file  . Number of children: Not on file  . Years of education: Not on file  . Highest education level: Not on file  Occupational History  . Not on file  Social Needs  . Financial resource strain: Not on file  . Food insecurity:    Worry: Not on file    Inability: Not on file  . Transportation needs:    Medical: Not on file    Non-medical: Not on file  Tobacco Use  . Smoking status:  Former Smoker    Packs/day: 0.50    Last attempt to quit: 01/14/2011    Years since quitting: 6.7  . Smokeless tobacco: Never Used  Substance and Sexual Activity  . Alcohol use: No  . Drug use: No  . Sexual activity: Yes    Birth control/protection: None  Lifestyle  . Physical activity:    Days per week: Not on file    Minutes per session: Not on file  . Stress: Not on file  Relationships  . Social connections:    Talks on phone: Not on file    Gets together: Not on file    Attends religious service: Not on file    Active member of club or organization: Not on file    Attends meetings of clubs or organizations: Not on file    Relationship status: Not on file  Other Topics Concern  . Not on file  Social History Narrative  . Not on file   Past Surgical History:  Procedure Laterality Date  . CESAREAN SECTION  09/14/2011   Procedure: CESAREAN SECTION;  Surgeon: Freddrick March. Tenny Craw, MD;  Location: [facility];  Service: Gynecology;  Laterality: N/A;  Primary Cesarean Section with birth of baby girl @ 2302  . MYRINGOTOMY    . TONSILLECTOMY  1990  . TYMPANOSTOMY TUBE PLACEMENT     '90, '96, '12  . WISDOM TOOTH EXTRACTION     Past Medical History:  Diagnosis Date  . Abnormal Pap smear   . Abscess    behind the right ear  . Chicken pox   . Complication of anesthesia    Getsvery emotional  . Depression   . GERD (gastroesophageal reflux disease)   . Heart murmur   . History of CHF (congestive heart failure)    post partum 09/2011 - echo 09/21/11: EF 60-65%, mod MR, mild LAE (due to post partum fluid shifts, HTN, obesity, ?MR)  . Hypertension   . Hypothyroidism   . Migraines   . Moderate mitral regurgitation    needs echo 03/2012  . MRSA (methicillin resistant staph aureus) culture positive   . PTSD (post-traumatic stress disorder)   . Scarlet fever x 2  . Seasonal allergies   . Urinary tract infection    BP 124/84 (BP Location: Right Arm, Patient Position: Sitting, Cuff Size:  Large)   Pulse 78   Resp 14   Ht 5\' 8"  (1.727 m)   Wt 284 lb (128.8 kg)   SpO2 98%   BMI 43.18 kg/m   Opioid Risk Score:   Fall Risk Score:  `1  Depression screen PHQ 2/9  Depression screen [facility] 2/9 10/18/2017 09/20/2017 07/30/2017 06/22/2017 04/24/2017 02/22/2017 01/25/2017  Decreased Interest 0 0 0 0 0 0 0  Down, Depressed, Hopeless 0 0 0 0 0 0 0  PHQ - 2 Score 0 0 0 0 0 0 0  Altered sleeping - - - - - - -  Tired, decreased energy - - - - - - -  Change in appetite - - - - - - -  Feeling bad or failure about yourself  - - - - - - -  Trouble concentrating - - - - - - -  Moving slowly or fidgety/restless - - - - - - -  Suicidal thoughts - - - - - - -  PHQ-9 Score - - - - - - -  Difficult doing work/chores - - - - - - -    Review of Systems  Constitutional: Negative.   HENT: Negative.   Eyes: Negative.   Respiratory: Negative.   Cardiovascular: Negative.   Gastrointestinal: Negative.   Endocrine: Negative.   Genitourinary: Negative.   Musculoskeletal: Positive for back pain.  Skin: Negative.   Allergic/Immunologic: Negative.   Neurological: Negative.   Hematological: Negative.   Psychiatric/Behavioral: Negative.   All other systems reviewed and are negative.      Objective:   Physical Exam  Constitutional: She is oriented to person, place, and time. She appears well-developed and well-nourished.  HENT:  Head: Normocephalic and atraumatic.  Neck: Normal range of motion. Neck supple.  Cardiovascular: Normal rate and regular rhythm.  Pulmonary/Chest: Effort normal and breath sounds normal.  Musculoskeletal:  Normal Muscle Bulk and Muscle Testing Reveals: Upper Extremities: Full ROM and Muscle Strength 5/5 Thoracic Paraspinal Tenderness: T-7-T9 Lumbar Paraspinal Tenderness: L-45 Lower Extremities: Full ROM and Muscle Strength 5/5 Arises from Table with Ease  Narrow Based gait   Neurological: She is alert and oriented to person, place, and time.  Skin: Skin is warm and  dry.  Psychiatric: She has a normal mood and affect.  Nursing note and vitals reviewed.  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 10/18/2017. Refilled: Continue current treatment regimen: Nucynta 75 mg one tablet every 6 hours as needed. #120 We will continue the opioid monitoring program, this consists of regular clinic visits, examinations, urine drug screen, pill counts as well as use of [place] Controlled Substance Reporting System. 2. Left Greater Trochanteric Tenderness: No complaints today.Continue with HEP and Heat and Ice Therapy.10/18/2017 3.Muscle Spasm: Continue current medication regimen with  Flexeril. 10/18/2017.  20 minutes of face to face patient care time was spent during this visit. All questions were encouraged and answered.  F/U in 1 month

## 2017-10-19 ENCOUNTER — Encounter: Payer: Self-pay | Admitting: Family Medicine

## 2017-10-19 ENCOUNTER — Other Ambulatory Visit: Payer: Self-pay | Admitting: General Practice

## 2017-10-19 MED ORDER — PANTOPRAZOLE SODIUM 40 MG PO TBEC: 40.0000 mg | DELAYED_RELEASE_TABLET | Freq: Two times a day (BID) | ORAL | 6 refills | Status: DC

## 2017-10-23 ENCOUNTER — Other Ambulatory Visit: Payer: Self-pay

## 2017-10-23 ENCOUNTER — Encounter: Payer: Self-pay | Admitting: Family Medicine

## 2017-10-23 ENCOUNTER — Ambulatory Visit: Payer: 59 | Admitting: Family Medicine

## 2017-10-23 VITALS — BP 120/80 | HR 74 | Temp 98.4°F | Resp 16 | Ht 68.0 in | Wt 286.5 lb

## 2017-10-23 DIAGNOSIS — E039 Hypothyroidism, unspecified: Secondary | ICD-10-CM

## 2017-10-23 DIAGNOSIS — I10 Essential (primary) hypertension: Secondary | ICD-10-CM

## 2017-10-23 LAB — CBC WITH DIFFERENTIAL/PLATELET
BASOS ABS: 0 10*3/uL (ref 0.0–0.1)
BASOS PCT: 0.3 % (ref 0.0–3.0)
EOS ABS: 0 10*3/uL (ref 0.0–0.7)
Eosinophils Relative: 0.7 % (ref 0.0–5.0)
HCT: 32.7 % — ABNORMAL LOW (ref 36.0–46.0)
HEMOGLOBIN: 11.1 g/dL — AB (ref 12.0–15.0)
Lymphocytes Relative: 25.3 % (ref 12.0–46.0)
Lymphs Abs: 1.4 10*3/uL (ref 0.7–4.0)
MCHC: 34 g/dL (ref 30.0–36.0)
MCV: 90.5 fl (ref 78.0–100.0)
MONO ABS: 0.5 10*3/uL (ref 0.1–1.0)
Monocytes Relative: 8.8 % (ref 3.0–12.0)
Neutro Abs: 3.5 10*3/uL (ref 1.4–7.7)
Neutrophils Relative %: 64.9 % (ref 43.0–77.0)
PLATELETS: 242 10*3/uL (ref 150.0–400.0)
RBC: 3.61 Mil/uL — ABNORMAL LOW (ref 3.87–5.11)
RDW: 13.7 % (ref 11.5–15.5)
WBC: 5.4 10*3/uL (ref 4.0–10.5)

## 2017-10-23 LAB — BASIC METABOLIC PANEL
BUN: 13 mg/dL (ref 6–23)
CALCIUM: 8.7 mg/dL (ref 8.4–10.5)
CO2: 27 meq/L (ref 19–32)
CREATININE: 0.73 mg/dL (ref 0.40–1.20)
Chloride: 106 mEq/L (ref 96–112)
GFR: 96.6 mL/min (ref >60.00)
GLUCOSE: 98 mg/dL (ref 70–99)
Potassium: 3.7 mEq/L (ref 3.5–5.1)
Sodium: 139 mEq/L (ref 135–145)

## 2017-10-23 LAB — HEPATIC FUNCTION PANEL
ALBUMIN: 4 g/dL (ref 3.5–5.2)
ALT: 18 U/L (ref 0–35)
AST: 16 U/L (ref 0–37)
Alkaline Phosphatase: 43 U/L (ref 39–117)
Bilirubin, Direct: 0.2 mg/dL (ref 0.0–0.3)
TOTAL PROTEIN: 6.9 g/dL (ref 6.0–8.3)
Total Bilirubin: 0.8 mg/dL (ref 0.2–1.2)

## 2017-10-23 LAB — LIPID PANEL
Cholesterol: 133 mg/dL (ref 0–200)
HDL: 37.1 mg/dL — AB (ref >39.00)
LDL Cholesterol: 79 mg/dL (ref 0–99)
NONHDL: 96.2
Total CHOL/HDL Ratio: 4
Triglycerides: 84 mg/dL (ref 0.0–149.0)
VLDL: 16.8 mg/dL (ref 0.0–40.0)

## 2017-10-23 LAB — TSH: TSH: 0.24 u[IU]/mL — ABNORMAL LOW (ref 0.35–4.50)

## 2017-10-23 NOTE — Assessment & Plan Note (Signed)
Ongoing issue.  Stressed need for healthy diet and regular exercise.  Pt is interested in seeing Bariatrics.  Will refer to Dr Cathey Endow.  Pt expressed understanding and is in agreement w/ plan.

## 2017-10-23 NOTE — Assessment & Plan Note (Signed)
Chronic problem.  Well controlled.  Asymptomatic.  Check labs.  No anticipated med changes. 

## 2017-10-23 NOTE — Assessment & Plan Note (Signed)
Chronic problem.  Currently asymptomatic.  Check labs.  Adjust meds prn  

## 2017-10-23 NOTE — Progress Notes (Signed)
   Subjective:    Patient ID: Suzanne Villanueva, female    DOB: Sep 10, 1980, 37 y.o.   MRN: [id number]  HPI HTN- chronic problem, on Losartan-HCTZ 100/25mg  daily.  Good control today.  No CP, SOB, HAs, visual changes, edema.    Hypothyroid- chronic problem, on Levothyroxine daily.  Denies excessive fatigue, changes to skin/hair/nails.  Obesity- ongoing issue.  BMI is 43.6.  No regular exercise.  Pt has been in school.   Review of Systems For ROS see HPI     Objective:   Physical Exam  Constitutional: She is oriented to person, place, and time. She appears well-developed and well-nourished. No distress.  obese  HENT:  Head: Normocephalic and atraumatic.  Eyes: Pupils are equal, round, and reactive to light. Conjunctivae and EOM are normal.  Neck: Normal range of motion. Neck supple. No thyromegaly present.  Cardiovascular: Normal rate, regular rhythm, normal heart sounds and intact distal pulses.  No murmur heard. Pulmonary/Chest: Effort normal and breath sounds normal. No respiratory distress.  Abdominal: Soft. She exhibits no distension. There is no tenderness.  Musculoskeletal: She exhibits no edema.  Lymphadenopathy:    She has no cervical adenopathy.  Neurological: She is alert and oriented to person, place, and time.  Skin: Skin is warm and dry.  Psychiatric: She has a normal mood and affect. Her behavior is normal.  Vitals reviewed.         Assessment & Plan:

## 2017-10-23 NOTE — Patient Instructions (Addendum)
Schedule your complete physical in 6 months We'll notify you of your lab results and make any changes if needed Continue to work on healthy diet and regular exercise- you can do it!!! We'll call you with your Bariatrics appt Call with any questions or concerns Happy Mother's Day!!!

## 2017-10-24 ENCOUNTER — Other Ambulatory Visit: Payer: Self-pay | Admitting: General Practice

## 2017-10-24 DIAGNOSIS — E039 Hypothyroidism, unspecified: Secondary | ICD-10-CM

## 2017-10-24 MED ORDER — LEVOTHYROXINE SODIUM 137 MCG PO TABS: 137.0000 ug | ORAL_TABLET | Freq: Every day | ORAL | 3 refills | Status: DC

## 2017-11-02 ENCOUNTER — Encounter: Payer: Self-pay | Admitting: Family Medicine

## 2017-11-06 NOTE — Telephone Encounter (Signed)
LMOVM let pt know that [facility] Bariatric LM on her VM to schedule an appt. Gave pt their number for her to call.

## 2017-11-16 ENCOUNTER — Other Ambulatory Visit: Payer: Self-pay | Admitting: Family Medicine

## 2017-11-19 ENCOUNTER — Ambulatory Visit: Payer: 59 | Admitting: Physical Medicine & Rehabilitation

## 2017-11-20 ENCOUNTER — Other Ambulatory Visit: Payer: Self-pay

## 2017-11-20 ENCOUNTER — Ambulatory Visit: Payer: 59 | Admitting: Physical Medicine & Rehabilitation

## 2017-11-20 ENCOUNTER — Encounter: Payer: Self-pay | Admitting: Physical Medicine & Rehabilitation

## 2017-11-20 ENCOUNTER — Encounter: Payer: 59 | Attending: Physical Medicine & Rehabilitation

## 2017-11-20 VITALS — BP 121/80 | HR 80 | Ht 68.0 in | Wt 281.0 lb

## 2017-11-20 DIAGNOSIS — Z5181 Encounter for therapeutic drug level monitoring: Secondary | ICD-10-CM | POA: Diagnosis not present

## 2017-11-20 DIAGNOSIS — K219 Gastro-esophageal reflux disease without esophagitis: Secondary | ICD-10-CM | POA: Insufficient documentation

## 2017-11-20 DIAGNOSIS — E039 Hypothyroidism, unspecified: Secondary | ICD-10-CM | POA: Diagnosis not present

## 2017-11-20 DIAGNOSIS — F4311 Post-traumatic stress disorder, acute: Secondary | ICD-10-CM | POA: Diagnosis not present

## 2017-11-20 DIAGNOSIS — M47816 Spondylosis without myelopathy or radiculopathy, lumbar region: Secondary | ICD-10-CM | POA: Diagnosis not present

## 2017-11-20 DIAGNOSIS — Z79899 Other long term (current) drug therapy: Secondary | ICD-10-CM | POA: Insufficient documentation

## 2017-11-20 DIAGNOSIS — M259 Joint disorder, unspecified: Secondary | ICD-10-CM | POA: Diagnosis not present

## 2017-11-20 DIAGNOSIS — G894 Chronic pain syndrome: Secondary | ICD-10-CM

## 2017-11-20 DIAGNOSIS — M1288 Other specific arthropathies, not elsewhere classified, other specified site: Secondary | ICD-10-CM | POA: Diagnosis not present

## 2017-11-20 DIAGNOSIS — I10 Essential (primary) hypertension: Secondary | ICD-10-CM | POA: Diagnosis not present

## 2017-11-20 DIAGNOSIS — Z87891 Personal history of nicotine dependence: Secondary | ICD-10-CM | POA: Insufficient documentation

## 2017-11-20 DIAGNOSIS — Z833 Family history of diabetes mellitus: Secondary | ICD-10-CM | POA: Diagnosis not present

## 2017-11-20 DIAGNOSIS — Z79891 Long term (current) use of opiate analgesic: Secondary | ICD-10-CM

## 2017-11-20 DIAGNOSIS — Z8249 Family history of ischemic heart disease and other diseases of the circulatory system: Secondary | ICD-10-CM | POA: Insufficient documentation

## 2017-11-20 DIAGNOSIS — G8929 Other chronic pain: Secondary | ICD-10-CM | POA: Diagnosis not present

## 2017-11-20 DIAGNOSIS — Z8619 Personal history of other infectious and parasitic diseases: Secondary | ICD-10-CM | POA: Diagnosis not present

## 2017-11-20 DIAGNOSIS — Z7901 Long term (current) use of anticoagulants: Secondary | ICD-10-CM | POA: Diagnosis not present

## 2017-11-20 DIAGNOSIS — Z8261 Family history of arthritis: Secondary | ICD-10-CM | POA: Insufficient documentation

## 2017-11-20 DIAGNOSIS — M519 Unspecified thoracic, thoracolumbar and lumbosacral intervertebral disc disorder: Secondary | ICD-10-CM | POA: Diagnosis present

## 2017-11-20 MED ORDER — TAPENTADOL HCL 75 MG PO TABS: 75.0000 mg | ORAL_TABLET | Freq: Four times a day (QID) | ORAL | 0 refills | Status: DC | PRN

## 2017-11-20 NOTE — Patient Instructions (Signed)
Cont exercise and weight loss  Cont walking ~39min per day

## 2017-11-20 NOTE — Progress Notes (Signed)
Subjective:    Patient ID: Suzanne Villanueva, female    DOB: 02-18-1981, 37 y.o.   MRN: [id number]  HPI   37 year old female with history of chronic low back pain secondary to lumbar spondylosis without myelopathy.  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  No side effect from medication  Nucynta reduces pain by ~75%  30lb weight loss, over 4-38months, has an appointment to see a bariatric medicine specialist.  She does back stretching and strengthening exercises.,  She is walking 45 minutes 3 or 4 times per week Pain Inventory Average Pain 6 Pain Right Now 4 My pain is constant, burning and dull  In the last 24 hours, has pain interfered with the following? General activity 3 Relation with others 3 Enjoyment of life 4 What TIME of day is your pain at its worst? daytime Sleep (in general) Fair  Pain is worse with: inactivity and some activites Pain improves with: heat/ice, therapy/exercise, medication and TENS Relief from Meds: 7  Mobility Do you have any goals in this area?  no  Function Do you have any goals in this area?  no  Neuro/Psych No problems in this area  Prior Studies Any changes since last visit?  no  Physicians involved in your care Any changes since last visit?  no   Family History  Problem Relation Age of Onset  . Heart attack Father   . Heart disease Unknown        parents  . Hypertension Unknown        parents  . Arthritis Unknown        grandparents  . Diabetes Unknown        parents   Social History   Socioeconomic History  . Marital status: Married    Spouse name: Not on file  . Number of children: Not on file  . Years of education: Not on file  . Highest education level: Not on file  Occupational History  . Not on file  Social Needs  . Financial resource strain: Not on file  . Food insecurity:    Worry: Not on  file    Inability: Not on file  . Transportation needs:    Medical: Not on file    Non-medical: Not on file  Tobacco Use  . Smoking status: Former Smoker    Packs/day: 0.50    Last attempt to quit: 01/14/2011    Years since quitting: 6.8  . Smokeless tobacco: Never Used  Substance and Sexual Activity  . Alcohol use: No  . Drug use: No  . Sexual activity: Yes    Birth control/protection: None  Lifestyle  . Physical activity:    Days per week: Not on file    Minutes per session: Not on file  . Stress: Not on file  Relationships  . Social connections:    Talks on phone: Not on file    Gets together: Not on file    Attends religious service: Not on file    Active member of club or organization: Not on file    Attends meetings of clubs or organizations: Not on file    Relationship status: Not on file  Other Topics Concern  . Not on file  Social History Narrative  . Not on file   Past Surgical History:  Procedure Laterality Date  . CESAREAN SECTION  09/14/2011   Procedure: CESAREAN SECTION;  Surgeon: Freddrick March. Tenny Craw, MD;  Location: [facility];  Service: Gynecology;  Laterality: N/A;  Primary Cesarean Section with birth of baby girl @ 2302  . MYRINGOTOMY    . TONSILLECTOMY  1990  . TYMPANOSTOMY TUBE PLACEMENT     '90, '96, '12  . WISDOM TOOTH EXTRACTION     Past Medical History:  Diagnosis Date  . Abnormal Pap smear   . Abscess    behind the right ear  . Chicken pox   . Complication of anesthesia    Getsvery emotional  . Depression   . GERD (gastroesophageal reflux disease)   . Heart murmur   . History of CHF (congestive heart failure)    post partum 09/2011 - echo 09/21/11: EF 60-65%, mod MR, mild LAE (due to post partum fluid shifts, HTN, obesity, ?MR)  . Hypertension   . Hypothyroidism   . Migraines   . Moderate mitral regurgitation    needs echo 03/2012  . MRSA (methicillin resistant staph aureus) culture positive   . PTSD (post-traumatic stress disorder)   .  Scarlet fever x 2  . Seasonal allergies   . Urinary tract infection    BP 121/80   Pulse 80   Ht 5\' 8"  (1.727 m) Comment: pt reported  Wt 281 lb (127.5 kg)   SpO2 97%   BMI 42.73 kg/m   Opioid Risk Score:   Fall Risk Score:  `1  Depression screen PHQ 2/9  Depression screen [facility] 2/9 11/20/2017 10/18/2017 09/20/2017 07/30/2017 06/22/2017 04/24/2017 02/22/2017  Decreased Interest 0 0 0 0 0 0 0  Down, Depressed, Hopeless 0 0 0 0 0 0 0  PHQ - 2 Score 0 0 0 0 0 0 0  Altered sleeping - - - - - - -  Tired, decreased energy - - - - - - -  Change in appetite - - - - - - -  Feeling bad or failure about yourself  - - - - - - -  Trouble concentrating - - - - - - -  Moving slowly or fidgety/restless - - - - - - -  Suicidal thoughts - - - - - - -  PHQ-9 Score - - - - - - -  Difficult doing work/chores - - - - - - -   Review of Systems  Constitutional: Negative.   HENT: Negative.   Eyes: Negative.   Respiratory: Negative.   Cardiovascular: Negative.   Gastrointestinal: Negative.   Endocrine: Negative.   Genitourinary: Negative.   Musculoskeletal: Negative.   Skin: Negative.   Allergic/Immunologic: Negative.   Neurological: Negative.   Hematological: Negative.   Psychiatric/Behavioral: Negative.   All other systems reviewed and are negative.      Objective:   Physical Exam  Constitutional: She is oriented to person, place, and time. She appears well-developed.  obese  HENT:  Head: Normocephalic and atraumatic.  Eyes: Pupils are equal, round, and reactive to light. EOM are normal.  Neck: Normal range of motion.  Musculoskeletal:  Has normal lumbar flexion extension lateral bending and rotation. Negative straight leg raise  Neurological: She is alert and oriented to person, place, and time. She exhibits normal muscle tone. Coordination and gait normal.  Motor strength is 5/5 bilateral hip flexor knee extensor ankle dorsiflexor  Skin: Skin is warm and dry.  Psychiatric: She has a  normal mood and affect.  Nursing note and vitals reviewed.  Assessment & Plan:  1.  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  She has excellent relief with Nucynta 75 mg 4 times per day.  Her only complaint is the cost.  We discussed other options such as switching to the extended release 150 mg twice a day.  She will check with her insurance on this. We discussed her current exercise program which appears to be appropriate it is difficult for her to do her walking exercise on the days that she is working 12 or 13 hours. We will continue her current dosage. PMP aware website has been reviewed, no red flags urine drug screen, will be performed today Treatment agreement was reviewed last month

## 2017-11-25 ENCOUNTER — Other Ambulatory Visit: Payer: Self-pay | Admitting: Registered Nurse

## 2017-11-25 LAB — TOXASSURE SELECT,+ANTIDEPR,UR

## 2017-11-27 ENCOUNTER — Telehealth: Payer: Self-pay | Admitting: *Deleted

## 2017-11-27 NOTE — Telephone Encounter (Signed)
Urine drug screen for this encounter is inconsistent.  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

## 2017-11-27 NOTE — Telephone Encounter (Signed)
Will need to discharge

## 2017-11-28 ENCOUNTER — Other Ambulatory Visit: Payer: Self-pay | Admitting: General Practice

## 2017-11-28 ENCOUNTER — Other Ambulatory Visit (INDEPENDENT_AMBULATORY_CARE_PROVIDER_SITE_OTHER): Payer: 59

## 2017-11-28 DIAGNOSIS — E039 Hypothyroidism, unspecified: Secondary | ICD-10-CM | POA: Diagnosis not present

## 2017-11-28 LAB — TSH: TSH: 0.06 u[IU]/mL — ABNORMAL LOW (ref 0.35–4.50)

## 2017-11-28 MED ORDER — LEVOTHYROXINE SODIUM 125 MCG PO TABS: 125.0000 ug | ORAL_TABLET | Freq: Every day | ORAL | 3 refills | Status: DC

## 2017-11-28 NOTE — Telephone Encounter (Signed)
Her last rx was 11/20/17.  It is now 11/28/17. Do you ned to issue a final Rx for her Nucynta?

## 2017-11-28 NOTE — Telephone Encounter (Signed)
Attempted to reach Suzanne Villanueva to notify her of findings. Her last Rx was given 11/20/17 so no final Rx will be given. A discharge letter will be mailed and via MyChart. Stacy returned my call and said her mother takes methadone and she was with her a few weeks ago and asked for some tylenol and she must have mistakenly given her the methadone, as she keeps them all together.  Discharge stands.

## 2017-11-29 NOTE — Telephone Encounter (Signed)
No final rx given that she obviously has access t other narcotics

## 2017-12-18 ENCOUNTER — Ambulatory Visit: Payer: 59 | Admitting: Registered Nurse

## 2017-12-27 ENCOUNTER — Other Ambulatory Visit: Payer: Self-pay | Admitting: General Practice

## 2017-12-27 ENCOUNTER — Other Ambulatory Visit (INDEPENDENT_AMBULATORY_CARE_PROVIDER_SITE_OTHER): Payer: 59

## 2017-12-27 DIAGNOSIS — E039 Hypothyroidism, unspecified: Secondary | ICD-10-CM | POA: Diagnosis not present

## 2017-12-27 LAB — TSH: TSH: 4.97 u[IU]/mL — ABNORMAL HIGH (ref 0.35–4.50)

## 2017-12-27 MED ORDER — LEVOTHYROXINE SODIUM 137 MCG PO TABS: 137.0000 ug | ORAL_TABLET | Freq: Every day | ORAL | 3 refills | Status: DC

## 2018-01-29 ENCOUNTER — Other Ambulatory Visit (INDEPENDENT_AMBULATORY_CARE_PROVIDER_SITE_OTHER): Payer: 59

## 2018-01-29 DIAGNOSIS — E039 Hypothyroidism, unspecified: Secondary | ICD-10-CM | POA: Diagnosis not present

## 2018-01-29 LAB — TSH: TSH: 5.96 u[IU]/mL — AB (ref 0.35–4.50)

## 2018-01-30 ENCOUNTER — Encounter: Payer: Self-pay | Admitting: Family Medicine

## 2018-01-30 ENCOUNTER — Other Ambulatory Visit: Payer: Self-pay | Admitting: General Practice

## 2018-01-30 DIAGNOSIS — E039 Hypothyroidism, unspecified: Secondary | ICD-10-CM

## 2018-01-30 MED ORDER — LEVOTHYROXINE SODIUM 150 MCG PO TABS: 150.0000 ug | ORAL_TABLET | Freq: Every day | ORAL | 0 refills | Status: DC

## 2018-02-16 ENCOUNTER — Other Ambulatory Visit: Payer: Self-pay | Admitting: Family Medicine

## 2018-02-19 ENCOUNTER — Encounter: Payer: Self-pay | Admitting: Family Medicine

## 2018-02-19 MED ORDER — LOSARTAN POTASSIUM-HCTZ 100-25 MG PO TABS: 1.0000 | ORAL_TABLET | Freq: Every day | ORAL | 0 refills | Status: DC

## 2018-03-12 ENCOUNTER — Other Ambulatory Visit: Payer: Self-pay | Admitting: Family Medicine

## 2018-03-25 ENCOUNTER — Other Ambulatory Visit: Payer: Self-pay | Admitting: Family Medicine

## 2018-03-25 ENCOUNTER — Encounter: Payer: Self-pay | Admitting: Family Medicine

## 2018-03-25 DIAGNOSIS — E039 Hypothyroidism, unspecified: Secondary | ICD-10-CM

## 2018-04-19 ENCOUNTER — Encounter: Payer: Self-pay | Admitting: Family Medicine

## 2018-04-19 MED ORDER — VALSARTAN-HYDROCHLOROTHIAZIDE 160-25 MG PO TABS: 1.0000 | ORAL_TABLET | Freq: Every day | ORAL | 3 refills | Status: DC

## 2018-05-02 ENCOUNTER — Encounter: Payer: 59 | Admitting: Family Medicine

## 2018-08-03 ENCOUNTER — Other Ambulatory Visit: Payer: Self-pay | Admitting: Physical Medicine & Rehabilitation

## 2018-09-16 ENCOUNTER — Other Ambulatory Visit: Payer: Self-pay | Admitting: Family Medicine

## 2018-09-17 ENCOUNTER — Telehealth: Payer: 59 | Admitting: Physician Assistant

## 2018-09-17 DIAGNOSIS — R197 Diarrhea, unspecified: Secondary | ICD-10-CM | POA: Diagnosis not present

## 2018-09-17 DIAGNOSIS — R110 Nausea: Secondary | ICD-10-CM | POA: Diagnosis not present

## 2018-09-17 MED ORDER — ONDANSETRON HCL 4 MG PO TABS: 4.0000 mg | ORAL_TABLET | Freq: Three times a day (TID) | ORAL | 0 refills | Status: DC | PRN

## 2018-09-17 NOTE — Progress Notes (Signed)
We are sorry that you are not feeling well. Here is how we plan to help!  Based on what you have shared with me it looks like you have a Virus that is irritating your GI tract.  Vomiting is the forceful emptying of a portion of the stomach's content through the mouth.  Although nausea and vomiting can make you feel miserable, it's important to remember that these are not diseases, but rather symptoms of an underlying illness.  When we treat short term symptoms, we always caution that any symptoms that persist should be fully evaluated in a medical office.  I have prescribed a medication that will help alleviate your symptoms and allow you to stay hydrated:  Zofran 4 mg 1 tablet every 8 hours as needed for nausea and vomiting. For diarrhea that is causing excessive fluid loss I recommend immodium otc as directed on the package.   HOME CARE: Drink clear liquids.  This is very important! Dehydration (the lack of fluid) can lead to a serious complication.  Start off with 1 tablespoon every 5 minutes for 8 hours. You may begin eating bland foods after 8 hours without vomiting.  Start with saltine crackers, white bread, rice, mashed potatoes, applesauce. After 48 hours on a bland diet, you may resume a normal diet. Try to go to sleep.  Sleep often empties the stomach and relieves the need to vomit.  GET HELP RIGHT AWAY IF:  Your symptoms do not improve or worsen within 2 days after treatment. You have a fever for over 3 days. You cannot keep down fluids after trying the medication.  MAKE SURE YOU:  Understand these instructions. Will watch your condition. Will get help right away if you are not doing well or get worse.   Thank you for choosing an e-visit. Your e-visit answers were reviewed by a board certified advanced clinical practitioner to complete your personal care plan. Depending upon the condition, your plan could have included both over the counter or prescription medications. Please  review your pharmacy choice. Be sure that the pharmacy you have chosen is open so that you can pick up your prescription now.  If there is a problem you may message your provider in MyChart to have the prescription routed to another pharmacy. Your safety is important to [place]. If you have drug allergies check your prescription carefully.  For the next 24 hours, you can use MyChart to ask questions about today's visit, request a non-urgent call back, or ask for a work or school excuse from your e-visit provider. You will get an e-mail in the next two days asking about your experience. I hope that your e-visit has been valuable and will speed your recovery.    ===View-only below this line===   ----- Message -----    From: Suzanne Villanueva    Sent: 09/17/2018  8:19 AM EDT      To: E-Visit Mailing List Subject: E-Visit Submission: Nausea & Vomiting  E-Visit Submission: Nausea & Vomiting --------------------------------  Question: What is your primary symptom? Answer:   Both vomiting and nausea  Question: Have you experienced fever or chills with your symptoms? Answer:   No  Question: How long have you been vomiting? Answer:   Started today  Question: How often have you been vomiting? Answer:   1-5 times a day  Question: Is there a red or maroon color or the appearance of coffee grounds in the vomit? Answer:   No  Question: Do you have any  of the following symptoms? (check all that apply) Answer:   Diarrhea  Question: Other symptoms or additional comments: Answer:   Abd pain,  feels like its in knots,  only in epigastric region.  Question: Are you taking any of the following medications? Answer:     Question: Have you tried any over the counter medications?  Answer:   Yes  Question: If Yes, please indicate which of the following over the counter medications you have tried (Check all that apply) Answer:   Nauzene  Question: Other over the counter medications you have tried or  comments: Answer:     Question: Were any of the medications effective? Answer:     Question: Is your mouth dry? Answer:   No  Question: Have you urinated? Answer:   Yes  Question: Was your urine dark? Answer:   No  Question: Are you able to keep down liquids? Answer:   Yes  Question: Please list your medication allergies that you may have ? (If 'none' , please list as 'none') Answer:   Doxycycline,  amoxicillin,  Question: Are you pregnant? Answer:   I am confident that I am not pregnant  Question: Are you breastfeeding? Answer:   No  Question: Please list any additional comments  Answer:   Had left over zofran odt, which was in date,  but did not seem to ease symptoms.  Think it maybe food related.

## 2018-09-24 ENCOUNTER — Other Ambulatory Visit: Payer: Self-pay | Admitting: Family Medicine

## 2018-09-25 NOTE — Telephone Encounter (Signed)
Patient needs med f/u appt and to schedule CPE

## 2018-09-25 NOTE — Telephone Encounter (Signed)
LMOVM to return call to schedule appt  

## 2018-09-26 ENCOUNTER — Encounter: Payer: Self-pay | Admitting: Emergency Medicine

## 2018-10-24 ENCOUNTER — Encounter: Payer: Self-pay | Admitting: Family Medicine

## 2018-10-24 ENCOUNTER — Ambulatory Visit (INDEPENDENT_AMBULATORY_CARE_PROVIDER_SITE_OTHER): Payer: 59 | Admitting: Family Medicine

## 2018-10-24 ENCOUNTER — Other Ambulatory Visit: Payer: Self-pay | Admitting: Family Medicine

## 2018-10-24 VITALS — HR 84 | Temp 98.2°F | Ht 68.0 in | Wt 290.0 lb

## 2018-10-24 DIAGNOSIS — I10 Essential (primary) hypertension: Secondary | ICD-10-CM

## 2018-10-24 NOTE — Progress Notes (Signed)
I have discussed the procedure for the virtual visit with the patient who has given consent to proceed with assessment and treatment.   Jessica L Brodmerkel, CMA     

## 2018-10-24 NOTE — Progress Notes (Signed)
Virtual Visit via Video   I connected with patient on 10/24/18 at  2:00 PM EDT by a video enabled telemedicine application and verified that I am speaking with the correct person using two identifiers.  Location patient: Home Location provider: [facility], Office Persons participating in the virtual visit: Patient, Provider, CMA (Jess B)  I discussed the limitations of evaluation and management by telemedicine and the availability of in person appointments. The patient expressed understanding and agreed to proceed.  Subjective:   HPI:   HTN- chronic problem, on Valsartan HCTZ 160/25 daily.  Today she palpated her BP at 128.  No CP, SOB, HAs, visual changes, edema.  Obesity- ongoing issue for pt.  She has gained 5 lbs since last visit.  BMI is 44.  Pt is walking some.    ROS:   See pertinent positives and negatives per HPI.  Patient Active Problem List   Diagnosis Date Noted  . Lumbar spondylosis 02/22/2017  . Chronic bilateral low back pain without sciatica 02/22/2017  . Disorder of left sacroiliac joint 03/23/2016  . Lumbar facet arthropathy 03/23/2016  . Breast mass, right 03/13/2016  . Morbid obesity (HCC) 04/22/2015  . Physical exam 04/08/2014  . Colicky RUQ abdominal pain 03/02/2014  . Hypothyroidism 10/03/2013  . HTN (hypertension) 10/03/2013  . PTSD (post-traumatic stress disorder) 10/03/2013  . Thoracic disc disease 10/03/2013  . Moderate mitral regurgitation   . Pulmonary edema w/congestive heart failure w/preserved LV function 09/22/2011    Social History   Tobacco Use  . Smoking status: Former Smoker    Packs/day: 0.50    Last attempt to quit: 01/14/2011    Years since quitting: 7.7  . Smokeless tobacco: Never Used  Substance Use Topics  . Alcohol use: No    Current Outpatient Medications:  .  levothyroxine (SYNTHROID) 175 MCG tablet, Take 175 mcg by mouth daily before breakfast., Disp: , Rfl:  .  norethindrone (MICRONOR,CAMILA,ERRIN) 0.35  MG tablet, Take 1 tablet by mouth daily., Disp: , Rfl:  .  ondansetron (ZOFRAN) 4 MG tablet, Take 1 tablet (4 mg total) by mouth every 8 (eight) hours as needed for nausea or vomiting., Disp: 20 tablet, Rfl: 0 .  pantoprazole (PROTONIX) 40 MG tablet, Take 1 tablet (40 mg total) by mouth 2 (two) times daily., Disp: 60 tablet, Rfl: 6 .  valsartan-hydrochlorothiazide (DIOVAN-HCT) 160-25 MG tablet, TAKE 1 TABLET BY MOUTH EVERY DAY, Disp: 30 tablet, Rfl: 0  Allergies  Allergen Reactions  . Doxycycline Swelling and Hives    Caused eyes/face to swell  . Amoxicillin Other (See Comments)    Diarrhea itching swelling  . Amoxapine And Related Diarrhea  . Other Swelling and Other (See Comments)    Antibiotic eye drops (pt not sure which kind) caused red swollen eye    Objective:   Pulse 84   Temp 98.2 F (36.8 C) (Oral)   Ht 5\' 8"  (1.727 m)   Wt 290 lb (131.5 kg)   BMI 44.09 kg/m   AAOx3, NAD NCAT, EOMI No obvious CN deficits Coloring WNL Pt is able to speak clearly, coherently without shortness of breath or increased work of breathing.  Thought process is linear.  Mood is appropriate.   Assessment and Plan:   HTN- chronic problem.  Adequate control by pt report.  Stressed need for healthy diet and regular exercise.  Check labs.  No anticipated med changes.  Will follow.  Obesity- ongoing issue for pt.  Stressed need for healthy diet and  regular exercise.  Check labs to risk stratify.  Will follow.   Neena Rhymes, MD 10/24/2018

## 2018-10-25 ENCOUNTER — Other Ambulatory Visit: Payer: Self-pay | Admitting: Family Medicine

## 2018-10-30 ENCOUNTER — Other Ambulatory Visit: Payer: 59

## 2018-11-08 ENCOUNTER — Other Ambulatory Visit (INDEPENDENT_AMBULATORY_CARE_PROVIDER_SITE_OTHER): Payer: 59

## 2018-11-08 ENCOUNTER — Other Ambulatory Visit: Payer: Self-pay

## 2018-11-08 DIAGNOSIS — I10 Essential (primary) hypertension: Secondary | ICD-10-CM | POA: Diagnosis not present

## 2018-11-08 LAB — LIPID PANEL
Cholesterol: 153 mg/dL (ref 0–200)
HDL: 46.2 mg/dL (ref >39.00)
LDL Cholesterol: 87 mg/dL (ref 0–99)
NonHDL: 106.97
Total CHOL/HDL Ratio: 3
Triglycerides: 101 mg/dL (ref 0.0–149.0)
VLDL: 20.2 mg/dL (ref 0.0–40.0)

## 2018-11-08 LAB — CBC WITH DIFFERENTIAL/PLATELET
Basophils Absolute: 0 10*3/uL (ref 0.0–0.1)
Basophils Relative: 0.6 % (ref 0.0–3.0)
Eosinophils Absolute: 0.1 10*3/uL (ref 0.0–0.7)
Eosinophils Relative: 1.7 % (ref 0.0–5.0)
HCT: 35.6 % — ABNORMAL LOW (ref 36.0–46.0)
Hemoglobin: 12.3 g/dL (ref 12.0–15.0)
Lymphocytes Relative: 32.3 % (ref 12.0–46.0)
Lymphs Abs: 1.9 10*3/uL (ref 0.7–4.0)
MCHC: 34.4 g/dL (ref 30.0–36.0)
MCV: 92 fl (ref 78.0–100.0)
Monocytes Absolute: 0.5 10*3/uL (ref 0.1–1.0)
Monocytes Relative: 8.7 % (ref 3.0–12.0)
Neutro Abs: 3.3 10*3/uL (ref 1.4–7.7)
Neutrophils Relative %: 56.7 % (ref 43.0–77.0)
Platelets: 214 10*3/uL (ref 150.0–400.0)
RBC: 3.88 Mil/uL (ref 3.87–5.11)
RDW: 13.7 % (ref 11.5–15.5)
WBC: 5.9 10*3/uL (ref 4.0–10.5)

## 2018-11-08 LAB — BASIC METABOLIC PANEL
BUN: 21 mg/dL (ref 6–23)
CO2: 27 mEq/L (ref 19–32)
Calcium: 9.1 mg/dL (ref 8.4–10.5)
Chloride: 106 mEq/L (ref 96–112)
Creatinine, Ser: 0.93 mg/dL (ref 0.40–1.20)
GFR: 68.32 mL/min (ref >60.00)
Glucose, Bld: 77 mg/dL (ref 70–99)
Potassium: 4.1 mEq/L (ref 3.5–5.1)
Sodium: 142 mEq/L (ref 135–145)

## 2018-11-08 LAB — HEPATIC FUNCTION PANEL
ALT: 13 U/L (ref 0–35)
AST: 15 U/L (ref 0–37)
Albumin: 4.1 g/dL (ref 3.5–5.2)
Alkaline Phosphatase: 49 U/L (ref 39–117)
Bilirubin, Direct: 0.1 mg/dL (ref 0.0–0.3)
Total Bilirubin: 0.6 mg/dL (ref 0.2–1.2)
Total Protein: 7 g/dL (ref 6.0–8.3)

## 2018-11-08 LAB — TSH: TSH: 1.78 u[IU]/mL (ref 0.35–4.50)

## 2018-11-14 ENCOUNTER — Other Ambulatory Visit: Payer: Self-pay

## 2018-11-14 MED ORDER — PANTOPRAZOLE SODIUM 40 MG PO TBEC: 40.0000 mg | DELAYED_RELEASE_TABLET | Freq: Two times a day (BID) | ORAL | 6 refills | Status: DC

## 2018-12-03 ENCOUNTER — Other Ambulatory Visit: Payer: Self-pay | Admitting: Family Medicine

## 2019-02-19 ENCOUNTER — Telehealth: Payer: Self-pay

## 2019-02-19 NOTE — Telephone Encounter (Signed)
If that doesn't work for her she will either need to wait or see someone else if she's in crisis while I'm out.

## 2019-02-19 NOTE — Telephone Encounter (Signed)
I can see her next week when PCP out of office if she cannot do Friday and needs to be seen before 17th

## 2019-02-19 NOTE — Telephone Encounter (Signed)
Pt has been scheduled with [facility] on 02/25/2019

## 2019-02-19 NOTE — Telephone Encounter (Signed)
Patient called in yesterday stating that she needed an appt with PCP to discuss anxiety and depression that she is currently struggling with. Stated that it is also difficult for her to focus throughout the day. Stated that she and PCP have discussed these issues in the past and believes it is time to possibly start medication. I informed patient that PCP did not have any appts this week and will be out of the office the week of Sept. 7th. I made an appt for Sept. 17th, but told her I would route message to PCP and Elyn Aquas to see if this was something he was comfortable seeing her for or if the initial visit needed to be with PCP. Please advise

## 2019-02-19 NOTE — Telephone Encounter (Signed)
Pt has an appt with PCP on 9/17 but would like to know if Einar Pheasant would be ok seeing her sooner as she is not sure she would like to wait that long.

## 2019-02-19 NOTE — Telephone Encounter (Signed)
Can we schedule pt with [facility]?

## 2019-02-19 NOTE — Telephone Encounter (Signed)
I offered that time slot to patient yesterday, patient is working.

## 2019-02-19 NOTE — Telephone Encounter (Signed)
She can be scheduled in my hospital f/u slot on Friday

## 2019-02-25 ENCOUNTER — Other Ambulatory Visit: Payer: Self-pay

## 2019-02-25 ENCOUNTER — Ambulatory Visit (INDEPENDENT_AMBULATORY_CARE_PROVIDER_SITE_OTHER): Payer: 59 | Admitting: Physician Assistant

## 2019-02-25 ENCOUNTER — Encounter: Payer: Self-pay | Admitting: Physician Assistant

## 2019-02-25 VITALS — BP 124/76 | HR 74 | Temp 97.4°F | Resp 18 | Ht 68.0 in | Wt 298.0 lb

## 2019-02-25 DIAGNOSIS — R41840 Attention and concentration deficit: Secondary | ICD-10-CM

## 2019-02-25 NOTE — Progress Notes (Signed)
Virtual Visit via Video   I connected with patient on 02/25/19 at  1:00 PM EDT by a video enabled telemedicine application and verified that I am speaking with the correct person using two identifiers.  Location patient: Home Location provider: [facility], Office Persons participating in the virtual visit: Patient, Provider, Pepin (Patina Moore)  I discussed the limitations of evaluation and management by telemedicine and the availability of in person appointments. The patient expressed understanding and agreed to proceed.  Subjective:   HPI:   Patient presents via [employer] today to discuss longstanding issue with focus. Notes she has had issue with attention for some time but this has significantly worsened over the past 6-7 months. Denies depressed mood, anxiety or anhedonia. Sleeping well. Notes difficulty completing tasks and easy errors. 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 Daily symptoms, worse when not at work. Lack of motivation. Denies noted memory loss. Endorses being told she had similar symptoms as a child but parents did not believe in ADD/ADHD.   ROS:   See pertinent positives and negatives per HPI.  Patient Active Problem List   Diagnosis Date Noted  . Lumbar spondylosis 02/22/2017  . Chronic bilateral low back pain without sciatica 02/22/2017  . Disorder of left sacroiliac joint 03/23/2016  . Lumbar facet arthropathy 03/23/2016  . Breast mass, right 03/13/2016  . Morbid obesity ([place]) 04/22/2015  . Physical exam 04/08/2014  . Colicky RUQ abdominal pain 03/02/2014  . Hypothyroidism 10/03/2013  . HTN (hypertension) 10/03/2013  . PTSD (post-traumatic stress disorder) 10/03/2013  . Thoracic disc disease 10/03/2013  . Moderate mitral regurgitation   . Pulmonary edema w/congestive heart failure w/preserved LV function 09/22/2011    Social  History   Tobacco Use  . Smoking status: Former Smoker    Packs/day: 0.50    Quit date: 01/14/2011    Years since quitting: 8.1  . Smokeless tobacco: Never Used  Substance Use Topics  . Alcohol use: No    Current Outpatient Medications:  .  levothyroxine (SYNTHROID) 175 MCG tablet, Take 175 mcg by mouth daily before breakfast., Disp: , Rfl:  .  norethindrone (MICRONOR,CAMILA,ERRIN) 0.35 MG tablet, Take 1 tablet by mouth daily., Disp: , Rfl:  .  pantoprazole (PROTONIX) 40 MG tablet, Take 1 tablet (40 mg total) by mouth 2 (two) times daily., Disp: 60 tablet, Rfl: 6 .  valsartan-hydrochlorothiazide (DIOVAN-HCT) 160-25 MG tablet, TAKE 1 TABLET BY MOUTH EVERY DAY, Disp: 90 tablet, Rfl: 0  Allergies  Allergen Reactions  . Doxycycline Swelling and Hives    Caused eyes/face to swell  . Amoxicillin Other (See Comments)    Diarrhea itching swelling  . Amoxapine And Related Diarrhea  . Other Swelling and Other (See Comments)    Antibiotic eye drops (pt not sure which kind) caused red swollen eye    Objective:   There were no vitals taken for this visit.  Patient is well-developed, well-nourished in no acute distress.  Resting comfortably at home.  Head is normocephalic, atraumatic.  No labored breathing.  Speech is clear and coherent with logical content.  Patient is alert and oriented at baseline.   Assessment and Plan:   1. Inattention Concern for ADD but no prior diagnosis. Has history of anxiety and depression but these do not seem to be present at current time. Will send her to our [facility] department  for formal evaluation for adult ADD. This way she and her PCP can discuss treatment options once a formal diagnosis has been given.    Piedad Climes, PA-C 02/25/2019

## 2019-02-25 NOTE — Patient Instructions (Signed)
Please call our [facility] at [phone] to schedule an appointment for ADD evaluation. These are typically done by either Dr. Irven Shelling, Dr. Janelle Floor or Dr. Laroy Apple. The staff there will be able to help you.   Once the evaluation is completed they will send results to Dr. Birdie Riddle so the two of you can discuss potential treatments.

## 2019-02-27 ENCOUNTER — Other Ambulatory Visit: Payer: Self-pay | Admitting: Family Medicine

## 2019-03-06 ENCOUNTER — Ambulatory Visit: Payer: 59 | Admitting: Family Medicine

## 2019-04-05 ENCOUNTER — Other Ambulatory Visit: Payer: Self-pay | Admitting: Family Medicine

## 2019-04-22 ENCOUNTER — Encounter: Payer: Self-pay | Admitting: Physician Assistant

## 2019-04-28 ENCOUNTER — Encounter: Payer: Self-pay | Admitting: Family Medicine

## 2019-04-30 ENCOUNTER — Encounter: Payer: Self-pay | Admitting: Family Medicine

## 2019-04-30 ENCOUNTER — Ambulatory Visit (INDEPENDENT_AMBULATORY_CARE_PROVIDER_SITE_OTHER): Payer: 59 | Admitting: Family Medicine

## 2019-04-30 ENCOUNTER — Other Ambulatory Visit: Payer: Self-pay

## 2019-04-30 VITALS — BP 126/78 | HR 82 | Temp 97.2°F | Resp 17 | Ht 68.0 in | Wt 292.0 lb

## 2019-04-30 DIAGNOSIS — F900 Attention-deficit hyperactivity disorder, predominantly inattentive type: Secondary | ICD-10-CM

## 2019-04-30 MED ORDER — METHYLPHENIDATE HCL ER (OSM) 18 MG PO TBCR: 18.0000 mg | EXTENDED_RELEASE_TABLET | Freq: Every day | ORAL | 0 refills | Status: DC

## 2019-04-30 NOTE — Progress Notes (Signed)
Virtual Visit via Video   I connected with patient on 04/30/19 at 11:30 AM EST by a video enabled telemedicine application and verified that I am speaking with the correct person using two identifiers.  Location patient: Home Location provider: [facility], Office Persons participating in the virtual visit: Patient, Provider, CMA (Jess B)  I discussed the limitations of evaluation and management by telemedicine and the availability of in person appointments. The patient expressed understanding and agreed to proceed.  Subjective:   HPI:   ADHD- Pt had complete evaluation done.  'it was not what I expected'.  Pt feels better 'knowing that there's an answer to things'.  Pt was dx'd w/ ADHD inattentive.  Pt was found to have underlying anxiety and poor coping skills despite being on the 'Peer Support Team' at work.  Has been in counseling in the past.  Pt has never been on medication for ADHD before.  ROS:   See pertinent positives and negatives per HPI.  Patient Active Problem List   Diagnosis Date Noted  . Lumbar spondylosis 02/22/2017  . Chronic bilateral low back pain without sciatica 02/22/2017  . Disorder of left sacroiliac joint 03/23/2016  . Lumbar facet arthropathy 03/23/2016  . Breast mass, right 03/13/2016  . Morbid obesity (HCC) 04/22/2015  . Physical exam 04/08/2014  . Colicky RUQ abdominal pain 03/02/2014  . Hypothyroidism 10/03/2013  . HTN (hypertension) 10/03/2013  . PTSD (post-traumatic stress disorder) 10/03/2013  . Thoracic disc disease 10/03/2013  . Moderate mitral regurgitation   . Pulmonary edema w/congestive heart failure w/preserved LV function 09/22/2011    Social History   Tobacco Use  . Smoking status: Former Smoker    Packs/day: 0.50    Quit date: 01/14/2011    Years since quitting: 8.2  . Smokeless tobacco: Never Used  Substance Use Topics  . Alcohol use: No    Current Outpatient Medications:  .  levothyroxine (SYNTHROID) 175 MCG  tablet, Take 175 mcg by mouth daily before breakfast., Disp: , Rfl:  .  norethindrone (MICRONOR,CAMILA,ERRIN) 0.35 MG tablet, Take 1 tablet by mouth daily., Disp: , Rfl:  .  pantoprazole (PROTONIX) 40 MG tablet, Take 1 tablet (40 mg total) by mouth 2 (two) times daily., Disp: 60 tablet, Rfl: 6 .  valsartan-hydrochlorothiazide (DIOVAN-HCT) 160-25 MG tablet, TAKE 1 TABLET BY MOUTH EVERY DAY, Disp: 30 tablet, Rfl: 0  Allergies  Allergen Reactions  . Doxycycline Swelling and Hives    Caused eyes/face to swell  . Amoxicillin Other (See Comments)    Diarrhea itching swelling  . Amoxapine And Related Diarrhea  . Other Swelling and Other (See Comments)    Antibiotic eye drops (pt not sure which kind) caused red swollen eye    Objective:   BP 126/78   Pulse 82   Temp (!) 97.2 F (36.2 C) (Skin)   Resp 17   Ht 5\' 8"  (1.727 m)   Wt 292 lb (132.5 kg)   BMI 44.40 kg/m  AAOx3, NAD NCAT, EOMI Obese No obvious CN deficits Coloring WNL Pt is able to speak clearly, coherently without shortness of breath or increased work of breathing.  Thought process is linear.  Mood is appropriate.   Assessment and Plan:   ADHD- new.  Reviewed pt's outside evaluation and was able to discuss this w/ her today.  She now has an official ADHD dx and co-morbid anxiety.  Will start treatment of ADHD w/ low dose Concerta w/ the understanding that we may need to titrate  up and/or add a short acting medication to cover late afternoon/evening hours.  Pt expressed understanding and is in agreement w/ plan.    Annye Asa, MD 04/30/2019

## 2019-04-30 NOTE — Progress Notes (Signed)
I have discussed the procedure for the virtual visit with the patient who has given consent to proceed with assessment and treatment.   Jessica L Brodmerkel, CMA     

## 2019-05-02 ENCOUNTER — Other Ambulatory Visit: Payer: Self-pay | Admitting: Family Medicine

## 2019-05-09 ENCOUNTER — Encounter: Payer: Self-pay | Admitting: Family Medicine

## 2019-05-12 MED ORDER — METHYLPHENIDATE HCL 5 MG PO TABS: ORAL_TABLET | ORAL | 0 refills | Status: DC

## 2019-05-12 NOTE — Telephone Encounter (Signed)
Pt called checking on this, please advise

## 2019-05-14 ENCOUNTER — Ambulatory Visit: Payer: 59 | Admitting: Family Medicine

## 2019-05-19 ENCOUNTER — Ambulatory Visit (INDEPENDENT_AMBULATORY_CARE_PROVIDER_SITE_OTHER): Payer: 59 | Admitting: Family Medicine

## 2019-05-19 ENCOUNTER — Encounter: Payer: Self-pay | Admitting: Family Medicine

## 2019-05-19 ENCOUNTER — Other Ambulatory Visit: Payer: Self-pay

## 2019-05-19 VITALS — BP 126/72 | HR 78 | Ht 68.0 in | Wt 292.0 lb

## 2019-05-19 DIAGNOSIS — F900 Attention-deficit hyperactivity disorder, predominantly inattentive type: Secondary | ICD-10-CM

## 2019-05-19 NOTE — Progress Notes (Signed)
Virtual Visit via Video   I connected with patient on 05/19/19 at  9:30 AM EST by a video enabled telemedicine application and verified that I am speaking with the correct person using two identifiers.  Location patient: Home Location provider: [facility], Office Persons participating in the virtual visit: Patient, Provider, CMA (Jess B)  I discussed the limitations of evaluation and management by telemedicine and the availability of in person appointments. The patient expressed understanding and agreed to proceed.  Subjective:   HPI:   ADHD- 'it's been doing a lot better' since starting medication.  She had noticed that medication would wear off between 2-3pm.  We added a short acting 5mg  tab for her to take in the afternoon and this has helped considerably.  Pt feels that being able to focus has also improved anxiety.  She finds herself less irritable and less likely to take things personally.  Sleeping well.  Mild decrease in appetite- pt is trying to use this to her advantage in weight loss.    ROS:   See pertinent positives and negatives per HPI.  Patient Active Problem List   Diagnosis Date Noted  . 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 Lumbar spondylosis 02/22/2017  . Chronic bilateral low back pain without sciatica 02/22/2017  . Disorder of left sacroiliac joint 03/23/2016  . Lumbar facet arthropathy 03/23/2016  . Breast mass, right 03/13/2016  . Morbid obesity (HCC) 04/22/2015  . Physical exam 04/08/2014  . Colicky RUQ abdominal pain 03/02/2014  . Hypothyroidism 10/03/2013  . HTN (hypertension) 10/03/2013  . PTSD (post-traumatic stress disorder) 10/03/2013  . Thoracic disc disease 10/03/2013  . Moderate mitral regurgitation   . Pulmonary edema w/congestive heart failure w/preserved LV function 09/22/2011    Social History   Tobacco Use  . Smoking status: Former Smoker    Packs/day: 0.50    Quit date: 01/14/2011     Years since quitting: 8.3  . Smokeless tobacco: Never Used  Substance Use Topics  . Alcohol use: No    Current Outpatient Medications:  .  levothyroxine (SYNTHROID) 175 MCG tablet, Take 175 mcg by mouth daily before breakfast., Disp: , Rfl:  .  methylphenidate (RITALIN) 5 MG tablet, Take 1 tab in the afternoon as needed for added symptom control, Disp: 30 tablet, Rfl: 0 .  methylphenidate 18 MG PO CR tablet, Take 1 tablet (18 mg total) by mouth daily., Disp: 30 tablet, Rfl: 0 .  norethindrone (MICRONOR,CAMILA,ERRIN) 0.35 MG tablet, Take 1 tablet by mouth daily., Disp: , Rfl:  .  pantoprazole (PROTONIX) 40 MG tablet, TAKE 1 TABLET BY MOUTH TWICE A DAY, Disp: 60 tablet, Rfl: 3 .  valsartan-hydrochlorothiazide (DIOVAN-HCT) 160-25 MG tablet, TAKE 1 TABLET BY MOUTH EVERY DAY, Disp: 30 tablet, Rfl: 0  Allergies  Allergen Reactions  . Doxycycline Swelling and Hives    Caused eyes/face to swell  . Amoxicillin Other (See Comments)    Diarrhea itching swelling  . Amoxapine And Related Diarrhea  . Other Swelling and Other (See Comments)    Antibiotic eye drops (pt not sure which kind) caused red swollen eye    Objective:   BP 126/72   Pulse 78   Ht 5\' 8"  (1.727 m)   Wt 292 lb (132.5 kg)   SpO2 97%   BMI 44.40 kg/m  AAOx3, NAD NCAT, EOMI No obvious CN deficits Coloring WNL Pt is able to speak clearly, coherently without shortness of breath or increased work of breathing.  Thought process is linear.  Mood is appropriate.   Assessment and Plan:   ADHD- improved since starting Concerta.  She was having issues w/ the medication wearing off in the afternoon but feels this has dramatically improved w/ the addition of short acting Ritalin between 2-3pm.  No med changes at this time.  Will follow.   Annye Asa, MD 05/19/2019

## 2019-05-19 NOTE — Progress Notes (Signed)
I have discussed the procedure for the virtual visit with the patient who has given consent to proceed with assessment and treatment.   Jessica L Brodmerkel, CMA     

## 2019-05-22 MED ORDER — METHYLPHENIDATE HCL ER (OSM) 18 MG PO TBCR: 18.0000 mg | EXTENDED_RELEASE_TABLET | Freq: Every day | ORAL | 0 refills | Status: DC

## 2019-05-22 MED ORDER — METHYLPHENIDATE HCL 5 MG PO TABS: ORAL_TABLET | ORAL | 0 refills | Status: DC

## 2019-05-22 MED ORDER — VALSARTAN-HYDROCHLOROTHIAZIDE 160-25 MG PO TABS: 1.0000 | ORAL_TABLET | Freq: Every day | ORAL | 1 refills | Status: DC

## 2019-05-22 NOTE — Telephone Encounter (Signed)
Last OV 05/19/19 Ritalin 5mg  last filled 05/12/19 #30 with 0 Methylphenidate 18mg  last filled 04/30/19 #30 with 0

## 2019-06-01 ENCOUNTER — Encounter: Payer: Self-pay | Admitting: Family Medicine

## 2019-06-02 MED ORDER — METHYLPHENIDATE HCL ER (OSM) 27 MG PO TBCR: 27.0000 mg | EXTENDED_RELEASE_TABLET | ORAL | 0 refills | Status: DC

## 2019-06-23 ENCOUNTER — Encounter: Payer: Self-pay | Admitting: Family Medicine

## 2019-06-25 MED ORDER — METHYLPHENIDATE HCL 5 MG PO TABS: 5.0000 mg | ORAL_TABLET | Freq: Three times a day (TID) | ORAL | 0 refills | Status: DC

## 2019-07-06 ENCOUNTER — Encounter: Payer: Self-pay | Admitting: Family Medicine

## 2019-07-07 MED ORDER — METHYLPHENIDATE HCL 10 MG PO TABS: 10.0000 mg | ORAL_TABLET | Freq: Three times a day (TID) | ORAL | 0 refills | Status: DC

## 2019-07-29 ENCOUNTER — Encounter: Payer: Self-pay | Admitting: Family Medicine

## 2019-08-05 MED ORDER — METHYLPHENIDATE HCL 10 MG PO TABS: ORAL_TABLET | ORAL | 0 refills | Status: DC

## 2019-08-15 ENCOUNTER — Other Ambulatory Visit: Payer: Self-pay | Admitting: Family Medicine

## 2019-08-21 ENCOUNTER — Encounter: Payer: Self-pay | Admitting: Family Medicine

## 2019-09-03 ENCOUNTER — Other Ambulatory Visit: Payer: Self-pay | Admitting: Family Medicine

## 2019-09-03 ENCOUNTER — Encounter: Payer: Self-pay | Admitting: Family Medicine

## 2019-09-03 MED ORDER — METHYLPHENIDATE HCL 20 MG PO TABS: 20.0000 mg | ORAL_TABLET | Freq: Three times a day (TID) | ORAL | 0 refills | Status: DC

## 2019-09-03 NOTE — Progress Notes (Signed)
Prescription filled at pt request °

## 2019-09-18 ENCOUNTER — Ambulatory Visit (INDEPENDENT_AMBULATORY_CARE_PROVIDER_SITE_OTHER)
Admission: RE | Admit: 2019-09-18 | Discharge: 2019-09-18 | Disposition: A | Discharge status: Home / Self Care | Payer: 59 | Source: Ambulatory Visit

## 2019-09-18 ENCOUNTER — Encounter: Payer: Self-pay | Admitting: Family Medicine

## 2019-09-18 ENCOUNTER — Other Ambulatory Visit: Payer: Self-pay

## 2019-09-18 DIAGNOSIS — Z87440 Personal history of urinary (tract) infections: Secondary | ICD-10-CM

## 2019-09-18 DIAGNOSIS — N3090 Cystitis, unspecified without hematuria: Secondary | ICD-10-CM | POA: Diagnosis not present

## 2019-09-18 DIAGNOSIS — Z87891 Personal history of nicotine dependence: Secondary | ICD-10-CM

## 2019-09-18 MED ORDER — NITROFURANTOIN MONOHYD MACRO 100 MG PO CAPS: 100.0000 mg | ORAL_CAPSULE | Freq: Two times a day (BID) | ORAL | 0 refills | Status: DC

## 2019-09-18 NOTE — Discharge Instructions (Addendum)
Take the antibiotic as directed.    Follow up with your primary care provider or come here to be seen in person if your symptoms are not improving.    

## 2019-09-18 NOTE — ED Provider Notes (Signed)
Virtual Visit via Video Note:  Suzanne Villanueva  initiated request for Telemedicine visit with Suzanne Villanueva [facility]gent [facility] team. I connected with Suzanne Villanueva  on 09/18/2019 at 12:15 PM  for a synchronized telemedicine visit using a video enabled HIPPA compliant telemedicine application. I verified that I am speaking with Suzanne Villanueva  using two identifiers. Suzanne Bail, NP  was physically located in a Suzanne Villanueva site and Suzanne Villanueva was located at a different location.   The limitations of evaluation and management by telemedicine as well as the availability of in-person appointments were discussed. Patient was informed that she  may incur a bill ( including co-pay) for this virtual visit encounter. Suzanne Villanueva  expressed understanding and gave verbal consent to proceed with virtual visit.     History of Present Illness:Suzanne Villanueva  is a 39 y.o. female presents for evaluation of dysuria, urinary frequency, urgency x 1 day.  Treating symptoms at home with AZO.  She denies fever, chills, abdominal pain, flank pain, vaginal discharge, pelvic pain, or other symptoms.  She denies pregnancy or breastfeeding.  History of UTIs.     Allergies  Allergen Reactions  . Doxycycline Swelling and Hives    Caused eyes/face to swell  . Amoxicillin Other (See Comments)    Diarrhea itching swelling  . Amoxapine And Related Diarrhea  . Other Swelling and Other (See Comments)    Antibiotic eye drops (pt not sure which kind) caused red swollen eye     Past Medical History:  Diagnosis Date  . Abnormal Pap smear   . Abscess    behind the right ear  . Chicken pox   . Complication of anesthesia    Getsvery emotional  . Depression   . GERD (gastroesophageal reflux disease)   . Heart murmur   . History of CHF (congestive heart failure)    post partum 09/2011 - echo 09/21/11: EF 60-65%, mod MR, mild LAE (due to post partum fluid shifts, HTN, obesity, ?MR)  . Hypertension   .  Hypothyroidism   . Migraines   . Moderate mitral regurgitation    needs echo 03/2012  . MRSA (methicillin resistant staph aureus) culture positive   . PTSD (post-traumatic stress disorder)   . Scarlet fever x 2  . Seasonal allergies   . Urinary tract infection      Social History   Tobacco Use  . Smoking status: Former Smoker    Packs/day: 0.50    Quit date: 01/14/2011    Years since quitting: 8.6  . Smokeless tobacco: Never Used  Substance Use Topics  . Alcohol use: No  . Drug use: No   ROS: as stated in HPI.  All other systems reviewed and negative.      Observations/Objective: Physical Exam  VITALS: Patient denies fever. GENERAL: Alert, appears well and in no acute distress. HEENT: Atraumatic. NECK: Normal movements of the head and neck. CARDIOPULMONARY: No increased WOB. Speaking in clear sentences. I:E ratio WNL.  MS: Moves all visible extremities without noticeable abnormality. PSYCH: Pleasant and cooperative, well-groomed. Speech normal rate and rhythm. Affect is appropriate. Insight and judgement are appropriate. Attention is focused, linear, and appropriate.  NEURO: CN grossly intact. Oriented as arrived to appointment on time with no prompting. Moves both UE equally.  SKIN: No obvious lesions, wounds, erythema, or cyanosis noted on face or hands.   Assessment and Plan:    ICD-10-CM   1. Cystitis  N30.90  Follow Up Instructions: Treating with Macrobid.  Instructed patient to follow-up with her PCP or come here to be seen in person if her symptoms or not improving.  Patient agrees to plan of Villanueva.      I discussed the assessment and treatment plan with the patient. The patient was provided an opportunity to ask questions and all were answered. The patient agreed with the plan and demonstrated an understanding of the instructions.   The patient was advised to call back or seek an in-person evaluation if the symptoms worsen or if the condition fails to  improve as anticipated.      Sharion Balloon, NP  09/18/2019 12:15 PM         Sharion Balloon, NP 09/18/19 1215

## 2019-09-18 NOTE — Telephone Encounter (Signed)
Patient would need appointment for medication.

## 2019-09-29 ENCOUNTER — Encounter: Payer: Self-pay | Admitting: Family Medicine

## 2019-09-29 MED ORDER — METHYLPHENIDATE HCL 20 MG PO TABS: 20.0000 mg | ORAL_TABLET | Freq: Three times a day (TID) | ORAL | 0 refills | Status: DC

## 2019-10-01 ENCOUNTER — Encounter: Payer: 59 | Admitting: Family Medicine

## 2019-10-06 ENCOUNTER — Other Ambulatory Visit: Payer: Self-pay

## 2019-10-06 ENCOUNTER — Ambulatory Visit (INDEPENDENT_AMBULATORY_CARE_PROVIDER_SITE_OTHER): Payer: 59 | Admitting: Family Medicine

## 2019-10-06 ENCOUNTER — Encounter: Payer: Self-pay | Admitting: Family Medicine

## 2019-10-06 VITALS — BP 128/81 | HR 81 | Temp 98.1°F | Resp 16 | Ht 68.0 in | Wt 342.2 lb

## 2019-10-06 DIAGNOSIS — N390 Urinary tract infection, site not specified: Secondary | ICD-10-CM | POA: Diagnosis not present

## 2019-10-06 DIAGNOSIS — Z0000 Encounter for general adult medical examination without abnormal findings: Secondary | ICD-10-CM | POA: Diagnosis not present

## 2019-10-06 LAB — LIPID PANEL
Cholesterol: 134 mg/dL (ref 0–200)
HDL: 46.9 mg/dL (ref >39.00)
LDL Cholesterol: 75 mg/dL (ref 0–99)
NonHDL: 86.86
Total CHOL/HDL Ratio: 3
Triglycerides: 60 mg/dL (ref 0.0–149.0)
VLDL: 12 mg/dL (ref 0.0–40.0)

## 2019-10-06 LAB — CBC WITH DIFFERENTIAL/PLATELET
Basophils Absolute: 0 10*3/uL (ref 0.0–0.1)
Basophils Relative: 0.2 % (ref 0.0–3.0)
Eosinophils Absolute: 0.1 10*3/uL (ref 0.0–0.7)
Eosinophils Relative: 1.8 % (ref 0.0–5.0)
HCT: 34.6 % — ABNORMAL LOW (ref 36.0–46.0)
Hemoglobin: 11.6 g/dL — ABNORMAL LOW (ref 12.0–15.0)
Lymphocytes Relative: 23.2 % (ref 12.0–46.0)
Lymphs Abs: 1.7 10*3/uL (ref 0.7–4.0)
MCHC: 33.5 g/dL (ref 30.0–36.0)
MCV: 91.7 fl (ref 78.0–100.0)
Monocytes Absolute: 0.7 10*3/uL (ref 0.1–1.0)
Monocytes Relative: 10 % (ref 3.0–12.0)
Neutro Abs: 4.7 10*3/uL (ref 1.4–7.7)
Neutrophils Relative %: 64.8 % (ref 43.0–77.0)
Platelets: 220 10*3/uL (ref 150.0–400.0)
RBC: 3.77 Mil/uL — ABNORMAL LOW (ref 3.87–5.11)
RDW: 13.7 % (ref 11.5–15.5)
WBC: 7.3 10*3/uL (ref 4.0–10.5)

## 2019-10-06 LAB — HEPATIC FUNCTION PANEL
ALT: 19 U/L (ref 0–35)
AST: 19 U/L (ref 0–37)
Albumin: 4.2 g/dL (ref 3.5–5.2)
Alkaline Phosphatase: 62 U/L (ref 39–117)
Bilirubin, Direct: 0.1 mg/dL (ref 0.0–0.3)
Total Bilirubin: 0.6 mg/dL (ref 0.2–1.2)
Total Protein: 7 g/dL (ref 6.0–8.3)

## 2019-10-06 LAB — HEMOGLOBIN A1C: Hgb A1c MFr Bld: 5.1 % (ref 4.6–6.5)

## 2019-10-06 LAB — BASIC METABOLIC PANEL
BUN: 12 mg/dL (ref 6–23)
CO2: 29 mEq/L (ref 19–32)
Calcium: 9 mg/dL (ref 8.4–10.5)
Chloride: 103 mEq/L (ref 96–112)
Creatinine, Ser: 0.73 mg/dL (ref 0.40–1.20)
GFR: 89.88 mL/min (ref >60.00)
Glucose, Bld: 91 mg/dL (ref 70–99)
Potassium: 4.5 mEq/L (ref 3.5–5.1)
Sodium: 139 mEq/L (ref 135–145)

## 2019-10-06 LAB — TSH: TSH: 0.79 u[IU]/mL (ref 0.35–4.50)

## 2019-10-06 MED ORDER — NITROFURANTOIN MONOHYD MACRO 100 MG PO CAPS: ORAL_CAPSULE | ORAL | 3 refills | Status: DC

## 2019-10-06 NOTE — Progress Notes (Signed)
   Subjective:    Patient ID: Suzanne Villanueva, female    DOB: 1980/09/08, 38 y.o.   MRN: [id number]  HPI CPE- UTD on pap, immunizations.  Pt has gained 50 lbs since November.  BMI is now 52.  Pt is aware of her weight gain and has restarted all of her previous interventions.  Is now walking 4 miles/week   Review of Systems Patient reports no vision/ hearing changes, adenopathy,fever, persistant/recurrent hoarseness , swallowing issues, chest pain, palpitations, edema, persistant/recurrent cough, hemoptysis, dyspnea (rest/exertional/paroxysmal nocturnal), gastrointestinal bleeding (melena, rectal bleeding), abdominal pain, significant heartburn, bowel changes, Gyn symptoms (abnormal  bleeding, pain),  syncope, focal weakness, memory loss, numbness & tingling, skin/hair/nail changes, abnormal bruising or bleeding, anxiety, or depression.   + recurrent UTIs after menstrual cycle  This visit occurred during the SARS-CoV-2 public health emergency.  Safety protocols were in place, including screening questions prior to the visit, additional usage of staff PPE, and extensive cleaning of exam room while observing appropriate contact time as indicated for disinfecting solutions.       Objective:   Physical Exam General Appearance:    Alert, cooperative, no distress, appears stated age, obese  Head:    Normocephalic, without obvious abnormality, atraumatic  Eyes:    PERRL, conjunctiva/corneas clear, EOM's intact, fundi    benign, both eyes  Ears:    Normal TM's and external ear canals, both ears  Nose:   Deferred due to COVID  Throat:   Neck:   Supple, symmetrical, trachea midline, no adenopathy;    Thyroid: no enlargement/tenderness/nodules  Back:     Symmetric, no curvature, ROM normal, no CVA tenderness  Lungs:     Clear to auscultation bilaterally, respirations unlabored  Chest Wall:    No tenderness or deformity   Heart:    Regular rate and rhythm, S1 and S2 normal, no murmur, rub   or  gallop  Breast Exam:    Deferred to GYN  Abdomen:     Soft, non-tender, bowel sounds active all four quadrants,    no masses, no organomegaly  Genitalia:    Deferred to GYN  Rectal:    Extremities:   Extremities normal, atraumatic, no cyanosis or edema  Pulses:   2+ and symmetric all extremities  Skin:   Skin color, texture, turgor normal, no rashes or lesions  Lymph nodes:   Cervical, supraclavicular, and axillary nodes normal  Neurologic:   CNII-XII intact, normal strength, sensation and reflexes    throughout          Assessment & Plan:

## 2019-10-06 NOTE — Patient Instructions (Addendum)
Follow up in 3 months to recheck weight loss progress We'll notify you of your lab results and make any changes if needed Continue to work on healthy diet and regular exercise- you can do it! Start the Macrobid nightly when you have your period to prevent UTIs Call with any questions or concerns Hang in there!

## 2019-10-06 NOTE — Assessment & Plan Note (Signed)
New.  Ongoing for pt for last year.  Occurs after each menstrual cycle.  Will try Macrobid nightly during menses to prevent infxn.  If no improvement, will need urology referral.  Pt expressed understanding and is in agreement w/ plan.

## 2019-10-06 NOTE — Assessment & Plan Note (Signed)
Deteriorated.  Pt has gained 50 lbs since last visit.  She is aware that she has gained weight and is working on diet and exercise to improve this.  Check labs to risk stratify and will follow closely.

## 2019-10-06 NOTE — Assessment & Plan Note (Signed)
Pt's PE WNL w/ exception of obesity.  UTD on pap, immunizations.  Check labs.  Anticipatory guidance provided.  

## 2019-10-26 ENCOUNTER — Encounter: Payer: Self-pay | Admitting: Family Medicine

## 2019-10-27 MED ORDER — METHYLPHENIDATE HCL 20 MG PO TABS: 20.0000 mg | ORAL_TABLET | Freq: Three times a day (TID) | ORAL | 0 refills | Status: DC

## 2019-10-27 NOTE — Telephone Encounter (Signed)
Last OV 10/06/19 Ritalin last filled 09/29/19 #90 with 0

## 2019-11-24 ENCOUNTER — Other Ambulatory Visit: Payer: Self-pay | Admitting: Family Medicine

## 2019-11-24 MED ORDER — METHYLPHENIDATE HCL 20 MG PO TABS: 20.0000 mg | ORAL_TABLET | Freq: Three times a day (TID) | ORAL | 0 refills | Status: DC

## 2019-11-24 NOTE — Telephone Encounter (Signed)
Last OV 10/06/19 Ritalin last filled 10/27/19 #90 with 0

## 2019-12-20 ENCOUNTER — Other Ambulatory Visit: Payer: Self-pay | Admitting: Family Medicine

## 2019-12-21 ENCOUNTER — Other Ambulatory Visit: Payer: Self-pay | Admitting: Family Medicine

## 2019-12-23 MED ORDER — METHYLPHENIDATE HCL 20 MG PO TABS: 20.0000 mg | ORAL_TABLET | Freq: Three times a day (TID) | ORAL | 0 refills | Status: DC

## 2019-12-23 NOTE — Telephone Encounter (Signed)
Ritalin LFD 11/24/19 #90 with no refills LOV 10/06/19 NOV none

## 2019-12-24 ENCOUNTER — Encounter: Payer: Self-pay | Admitting: Family Medicine

## 2019-12-25 ENCOUNTER — Encounter: Payer: Self-pay | Admitting: Family Medicine

## 2019-12-25 NOTE — Telephone Encounter (Signed)
Patient called back - I explained that this was just a 10 day supply and that Dr. Beverely Low would have to send the rest of the refill after she returns from vacation.

## 2019-12-25 NOTE — Telephone Encounter (Signed)
Gave partial refill in PCP absence. Full refill to come from PCP when she returns.

## 2020-01-01 ENCOUNTER — Other Ambulatory Visit: Payer: Self-pay | Admitting: Physician Assistant

## 2020-01-01 MED ORDER — METHYLPHENIDATE HCL 20 MG PO TABS: 20.0000 mg | ORAL_TABLET | Freq: Three times a day (TID) | ORAL | 0 refills | Status: DC

## 2020-01-01 NOTE — Telephone Encounter (Signed)
Last OV 10/06/19 Methylphenidate last filled 12/23/19 #30 with 0  This was partially filled by [facility] Pt takes TID

## 2020-01-27 ENCOUNTER — Other Ambulatory Visit: Payer: Self-pay | Admitting: Family Medicine

## 2020-01-27 MED ORDER — METHYLPHENIDATE HCL 20 MG PO TABS: 20.0000 mg | ORAL_TABLET | Freq: Three times a day (TID) | ORAL | 0 refills | Status: DC

## 2020-01-27 NOTE — Telephone Encounter (Signed)
Last OV 10/06/19 Ritalin last filled 01/01/20 #90 with 0

## 2020-02-24 ENCOUNTER — Other Ambulatory Visit: Payer: Self-pay | Admitting: Family Medicine

## 2020-02-24 MED ORDER — METHYLPHENIDATE HCL 20 MG PO TABS: 20.0000 mg | ORAL_TABLET | Freq: Three times a day (TID) | ORAL | 0 refills | Status: DC

## 2020-02-24 NOTE — Telephone Encounter (Signed)
Last OV 10/06/19 Ritalin last filled 01/27/20#90 with 0

## 2020-02-27 ENCOUNTER — Telehealth: Payer: Self-pay | Admitting: Family Medicine

## 2020-02-27 NOTE — Telephone Encounter (Signed)
PA completed in cover my meds

## 2020-02-27 NOTE — Telephone Encounter (Signed)
Patient called stating that we will be getting a fax from cover my meds for her rx - Methylphenidate 20 mg. Tablet 3 times a day.  - because she has new insurance it has to be resubmitted with a  KEY  CODE: [id number]

## 2020-03-23 ENCOUNTER — Other Ambulatory Visit: Payer: Self-pay | Admitting: Family Medicine

## 2020-03-23 MED ORDER — METHYLPHENIDATE HCL 20 MG PO TABS
20.0000 mg | ORAL_TABLET | Freq: Three times a day (TID) | ORAL | 0 refills | Status: DC
Start: 2020-03-23 — End: 2020-04-19

## 2020-03-23 NOTE — Telephone Encounter (Signed)
Last OV 10/06/19 Ritalin last filled 02/24/20 #90 with 0

## 2020-03-25 ENCOUNTER — Other Ambulatory Visit: Payer: Self-pay | Admitting: Family Medicine

## 2020-04-19 ENCOUNTER — Other Ambulatory Visit: Payer: Self-pay | Admitting: Family Medicine

## 2020-04-19 NOTE — Telephone Encounter (Signed)
Last OV 10/06/19 Ritalin last filled 03/23/20 #90 with 0

## 2020-04-20 MED ORDER — METHYLPHENIDATE HCL 20 MG PO TABS
20.0000 mg | ORAL_TABLET | Freq: Three times a day (TID) | ORAL | 0 refills | Status: DC
Start: 2020-04-20 — End: 2020-05-17

## 2020-05-17 ENCOUNTER — Other Ambulatory Visit: Payer: Self-pay | Admitting: Family Medicine

## 2020-05-17 NOTE — Telephone Encounter (Signed)
Ritalin last rx 04/20/2020 #90 LOV: 10/06/19 CPE

## 2020-05-18 MED ORDER — METHYLPHENIDATE HCL 20 MG PO TABS
20.0000 mg | ORAL_TABLET | Freq: Three times a day (TID) | ORAL | 0 refills | Status: DC
Start: 2020-05-18 — End: 2020-06-14

## 2020-06-14 ENCOUNTER — Other Ambulatory Visit: Payer: Self-pay | Admitting: Family Medicine

## 2020-06-14 MED ORDER — METHYLPHENIDATE HCL 20 MG PO TABS
20.0000 mg | ORAL_TABLET | Freq: Three times a day (TID) | ORAL | 0 refills | Status: DC
Start: 2020-06-14 — End: 2020-07-12

## 2020-06-30 ENCOUNTER — Encounter: Payer: Self-pay | Admitting: Family Medicine

## 2020-06-30 DIAGNOSIS — K219 Gastro-esophageal reflux disease without esophagitis: Secondary | ICD-10-CM

## 2020-07-02 ENCOUNTER — Other Ambulatory Visit: Payer: Self-pay | Admitting: Family Medicine

## 2020-07-02 DIAGNOSIS — K219 Gastro-esophageal reflux disease without esophagitis: Secondary | ICD-10-CM

## 2020-07-02 MED ORDER — PANTOPRAZOLE SODIUM 40 MG PO TBEC: 40.0000 mg | DELAYED_RELEASE_TABLET | Freq: Two times a day (BID) | ORAL | 0 refills | Status: DC

## 2020-07-12 ENCOUNTER — Other Ambulatory Visit: Payer: Self-pay | Admitting: Family Medicine

## 2020-07-12 MED ORDER — METHYLPHENIDATE HCL 20 MG PO TABS
20.0000 mg | ORAL_TABLET | Freq: Three times a day (TID) | ORAL | 0 refills | Status: DC
Start: 2020-07-12 — End: 2020-08-08

## 2020-07-12 NOTE — Telephone Encounter (Signed)
Requesting:Ritalin 20mg  Contract: UDS: Last Visit:10/06/19 Next Visit:n/a Last Refill:06/14/20 90 tabs 0 refills Please Advise

## 2020-08-08 ENCOUNTER — Other Ambulatory Visit: Payer: Self-pay | Admitting: Family Medicine

## 2020-08-08 DIAGNOSIS — F900 Attention-deficit hyperactivity disorder, predominantly inattentive type: Secondary | ICD-10-CM

## 2020-08-09 MED ORDER — METHYLPHENIDATE HCL 20 MG PO TABS: 20.0000 mg | ORAL_TABLET | Freq: Three times a day (TID) | ORAL | 0 refills | Status: DC

## 2020-08-09 NOTE — Telephone Encounter (Signed)
Ritalin last rx 07/12/20 #90 LOV: 10/06/19 CPE

## 2020-08-21 ENCOUNTER — Other Ambulatory Visit: Payer: Self-pay | Admitting: Family Medicine

## 2020-08-21 DIAGNOSIS — K219 Gastro-esophageal reflux disease without esophagitis: Secondary | ICD-10-CM

## 2020-09-02 ENCOUNTER — Other Ambulatory Visit: Payer: Self-pay | Admitting: Family Medicine

## 2020-09-02 DIAGNOSIS — F900 Attention-deficit hyperactivity disorder, predominantly inattentive type: Secondary | ICD-10-CM

## 2020-09-02 MED ORDER — METHYLPHENIDATE HCL 20 MG PO TABS: 20.0000 mg | ORAL_TABLET | Freq: Three times a day (TID) | ORAL | 0 refills | Status: DC

## 2020-09-02 NOTE — Telephone Encounter (Signed)
Last refill: 08/09/20 #90, 0 Last OV: 10/06/19 dx. CPE

## 2020-09-29 ENCOUNTER — Other Ambulatory Visit: Payer: Self-pay | Admitting: Family Medicine

## 2020-09-29 DIAGNOSIS — F900 Attention-deficit hyperactivity disorder, predominantly inattentive type: Secondary | ICD-10-CM

## 2020-09-29 MED ORDER — METHYLPHENIDATE HCL 20 MG PO TABS: 20.0000 mg | ORAL_TABLET | Freq: Three times a day (TID) | ORAL | 0 refills | Status: DC

## 2020-09-29 NOTE — Telephone Encounter (Signed)
Requesting:Ritalin 20mg  Contract: UDS: Last Visit:10/06/19 Next Visit:n/a Last Refill:09/02/20 90 tabs 0 refills  Please Advise

## 2020-10-28 ENCOUNTER — Other Ambulatory Visit: Payer: Self-pay | Admitting: Family Medicine

## 2020-10-28 DIAGNOSIS — F900 Attention-deficit hyperactivity disorder, predominantly inattentive type: Secondary | ICD-10-CM

## 2020-10-29 MED ORDER — METHYLPHENIDATE HCL 20 MG PO TABS: 20.0000 mg | ORAL_TABLET | Freq: Three times a day (TID) | ORAL | 0 refills | Status: DC

## 2020-10-29 NOTE — Telephone Encounter (Signed)
Requesting:Methylphenidate 20mg  Contract: UDS: Last Visit:10/06/19 Next Visit:n/a Last Refill:09/29/20 90 tabs 0 refills  Please Advise

## 2020-11-08 ENCOUNTER — Encounter: Payer: Self-pay | Admitting: Family Medicine

## 2020-11-08 ENCOUNTER — Other Ambulatory Visit: Payer: Self-pay

## 2020-11-08 MED ORDER — NITROFURANTOIN MONOHYD MACRO 100 MG PO CAPS: ORAL_CAPSULE | ORAL | 0 refills | Status: DC

## 2020-11-22 ENCOUNTER — Other Ambulatory Visit: Payer: Self-pay | Admitting: Family Medicine

## 2020-11-22 DIAGNOSIS — F900 Attention-deficit hyperactivity disorder, predominantly inattentive type: Secondary | ICD-10-CM

## 2020-11-23 MED ORDER — METHYLPHENIDATE HCL 20 MG PO TABS: 20.0000 mg | ORAL_TABLET | Freq: Three times a day (TID) | ORAL | 0 refills | Status: DC

## 2020-11-23 NOTE — Telephone Encounter (Signed)
LFD 10/29/20 #90 with no refills LOV 10/06/19 NOV none

## 2020-12-15 ENCOUNTER — Encounter: Payer: Self-pay | Admitting: *Deleted

## 2020-12-21 ENCOUNTER — Encounter: Payer: Self-pay | Admitting: Family Medicine

## 2020-12-22 ENCOUNTER — Other Ambulatory Visit: Payer: Self-pay | Admitting: Family Medicine

## 2020-12-22 DIAGNOSIS — F900 Attention-deficit hyperactivity disorder, predominantly inattentive type: Secondary | ICD-10-CM

## 2020-12-22 NOTE — Telephone Encounter (Signed)
Last filled 11/23/20 #90 with no refills Last office visit 10/06/19 Next office visit 12/31/20 for medication review

## 2020-12-24 MED ORDER — METHYLPHENIDATE HCL 20 MG PO TABS: 20.0000 mg | ORAL_TABLET | Freq: Three times a day (TID) | ORAL | 0 refills | Status: DC

## 2020-12-31 ENCOUNTER — Encounter: Payer: Self-pay | Admitting: Family Medicine

## 2020-12-31 ENCOUNTER — Other Ambulatory Visit: Payer: Self-pay

## 2020-12-31 ENCOUNTER — Ambulatory Visit: Payer: BC Managed Care – PPO | Admitting: Family Medicine

## 2020-12-31 VITALS — BP 122/82 | HR 88 | Temp 98.5°F | Resp 18 | Ht 68.0 in | Wt 367.8 lb

## 2020-12-31 DIAGNOSIS — I10 Essential (primary) hypertension: Secondary | ICD-10-CM

## 2020-12-31 DIAGNOSIS — F900 Attention-deficit hyperactivity disorder, predominantly inattentive type: Secondary | ICD-10-CM | POA: Diagnosis not present

## 2020-12-31 DIAGNOSIS — Z1159 Encounter for screening for other viral diseases: Secondary | ICD-10-CM | POA: Diagnosis not present

## 2020-12-31 LAB — CBC WITH DIFFERENTIAL/PLATELET
Basophils Absolute: 0 10*3/uL (ref 0.0–0.1)
Basophils Relative: 0.5 % (ref 0.0–3.0)
Eosinophils Absolute: 0.1 10*3/uL (ref 0.0–0.7)
Eosinophils Relative: 1.3 % (ref 0.0–5.0)
HCT: 34.5 % — ABNORMAL LOW (ref 36.0–46.0)
Hemoglobin: 11.8 g/dL — ABNORMAL LOW (ref 12.0–15.0)
Lymphocytes Relative: 24 % (ref 12.0–46.0)
Lymphs Abs: 2.1 10*3/uL (ref 0.7–4.0)
MCHC: 34.1 g/dL (ref 30.0–36.0)
MCV: 86 fl (ref 78.0–100.0)
Monocytes Absolute: 0.7 10*3/uL (ref 0.1–1.0)
Monocytes Relative: 8.2 % (ref 3.0–12.0)
Neutro Abs: 5.9 10*3/uL (ref 1.4–7.7)
Neutrophils Relative %: 66 % (ref 43.0–77.0)
Platelets: 258 10*3/uL (ref 150.0–400.0)
RBC: 4.02 Mil/uL (ref 3.87–5.11)
RDW: 14.2 % (ref 11.5–15.5)
WBC: 8.9 10*3/uL (ref 4.0–10.5)

## 2020-12-31 LAB — HEPATIC FUNCTION PANEL
ALT: 24 U/L (ref 0–35)
AST: 23 U/L (ref 0–37)
Albumin: 4.4 g/dL (ref 3.5–5.2)
Alkaline Phosphatase: 60 U/L (ref 39–117)
Bilirubin, Direct: 0.1 mg/dL (ref 0.0–0.3)
Total Bilirubin: 0.7 mg/dL (ref 0.2–1.2)
Total Protein: 7.4 g/dL (ref 6.0–8.3)

## 2020-12-31 LAB — BASIC METABOLIC PANEL
BUN: 15 mg/dL (ref 6–23)
CO2: 29 mEq/L (ref 19–32)
Calcium: 9.3 mg/dL (ref 8.4–10.5)
Chloride: 101 mEq/L (ref 96–112)
Creatinine, Ser: 0.95 mg/dL (ref 0.40–1.20)
GFR: 76.34 mL/min (ref >60.00)
Glucose, Bld: 98 mg/dL (ref 70–99)
Potassium: 3.8 mEq/L (ref 3.5–5.1)
Sodium: 139 mEq/L (ref 135–145)

## 2020-12-31 LAB — LIPID PANEL
Cholesterol: 150 mg/dL (ref 0–200)
HDL: 39.4 mg/dL (ref >39.00)
LDL Cholesterol: 82 mg/dL (ref 0–99)
NonHDL: 110.9
Total CHOL/HDL Ratio: 4
Triglycerides: 144 mg/dL (ref 0.0–149.0)
VLDL: 28.8 mg/dL (ref 0.0–40.0)

## 2020-12-31 LAB — TSH: TSH: 5.58 u[IU]/mL — ABNORMAL HIGH (ref 0.35–5.50)

## 2020-12-31 LAB — VITAMIN D 25 HYDROXY (VIT D DEFICIENCY, FRACTURES): VITD: 11.04 ng/mL — ABNORMAL LOW (ref 30.00–100.00)

## 2020-12-31 NOTE — Progress Notes (Signed)
   Subjective:    Patient ID: [name], female    DOB: 1980-10-30, 40 y.o.   MRN: [id number]  HPI ADHD- chronic problem, on Ritalin 20mg  TID.  Says she is feeling 'great' mentally.  Pt reports medication makes 'a world of difference'.  Denies palpitations.  HTN- chronic problem, on Valsartan HCTZ 160/25mg  daily w/ good control.  Pt reports feeling 'really good'  no CP, SOB, HAs, visual changes.  Trace edema bilaterally.  No abd pain, N/V.  Obesity- pt has gained 25 lbs since last visit.  BMI is now 55.92  Switched jobs ~1 yr ago, more sedentary than before.  No regular exercise   Review of Systems For ROS see HPI   This visit occurred during the SARS-CoV-2 public health emergency.  Safety protocols were in place, including screening questions prior to the visit, additional usage of staff PPE, and extensive cleaning of exam room while observing appropriate contact time as indicated for disinfecting solutions.      Objective:   Physical Exam Vitals reviewed.  Constitutional:      General: She is not in acute distress.    Appearance: She is well-developed. She is obese.  HENT:     Head: Normocephalic and atraumatic.  Eyes:     Conjunctiva/sclera: Conjunctivae normal.     Pupils: Pupils are equal, round, and reactive to light.  Neck:     Thyroid: No thyromegaly.  Cardiovascular:     Rate and Rhythm: Normal rate and regular rhythm.     Pulses: Normal pulses.     Heart sounds: Normal heart sounds. No murmur heard. Pulmonary:     Effort: Pulmonary effort is normal. No respiratory distress.     Breath sounds: Normal breath sounds.  Abdominal:     General: There is no distension.     Palpations: Abdomen is soft.     Tenderness: There is no abdominal tenderness.  Musculoskeletal:     Cervical back: Normal range of motion and neck supple.     Right lower leg: No edema.     Left lower leg: No edema.  Lymphadenopathy:     Cervical: No cervical adenopathy.  Skin:    General:  Skin is warm and dry.  Neurological:     General: No focal deficit present.     Mental Status: She is alert and oriented to person, place, and time.  Psychiatric:        Mood and Affect: Mood normal.        Behavior: Behavior normal.          Assessment & Plan:

## 2020-12-31 NOTE — Assessment & Plan Note (Signed)
Ongoing issue for pt.  Currently well controlled on Ritalin 20mg  TID.  Denies palpitations, insomnia, anxiety.  No med changes at this time

## 2020-12-31 NOTE — Assessment & Plan Note (Signed)
Deteriorated.  Pt has gained 25 lbs since last visit.  New job is more sedentary.  Stressed need for healthy diet and regular exercise.  Will follow.

## 2020-12-31 NOTE — Patient Instructions (Signed)
Schedule your complete physical in 6 months We'll notify you of your lab results and make any changes if needed Continue to work on healthy diet and regular exercise- you can do it! Call with any questions or concerns Stay Safe!  Stay Healthy! Have a great summer!!! 

## 2020-12-31 NOTE — Assessment & Plan Note (Signed)
Chronic problem.  Well controlled on Valsartan HCTZ 160/25mg  daily.  Currently asymptomatic w/ exception of occasional leg swelling.  Check labs.  No anticipated med changes.

## 2021-01-03 ENCOUNTER — Other Ambulatory Visit: Payer: Self-pay

## 2021-01-03 LAB — HEPATITIS C ANTIBODY
Hepatitis C Ab: NONREACTIVE
SIGNAL TO CUT-OFF: 0.1 (ref <1.00)

## 2021-01-03 MED ORDER — VITAMIN D (ERGOCALCIFEROL) 1.25 MG (50000 UNIT) PO CAPS: 50000.0000 [IU] | ORAL_CAPSULE | ORAL | 0 refills | Status: DC

## 2021-01-11 ENCOUNTER — Other Ambulatory Visit: Payer: Self-pay

## 2021-01-11 ENCOUNTER — Encounter: Payer: Self-pay | Admitting: Family Medicine

## 2021-01-11 MED ORDER — VALSARTAN-HYDROCHLOROTHIAZIDE 160-25 MG PO TABS: 1.0000 | ORAL_TABLET | Freq: Every day | ORAL | 1 refills | Status: DC

## 2021-01-17 ENCOUNTER — Other Ambulatory Visit: Payer: Self-pay | Admitting: Family Medicine

## 2021-01-17 DIAGNOSIS — F900 Attention-deficit hyperactivity disorder, predominantly inattentive type: Secondary | ICD-10-CM

## 2021-01-17 NOTE — Telephone Encounter (Signed)
LFD 12/24/20 #90 with no refills LOV 12/31/20 NOV 07/08/21

## 2021-01-18 MED ORDER — METHYLPHENIDATE HCL 20 MG PO TABS: 20.0000 mg | ORAL_TABLET | Freq: Three times a day (TID) | ORAL | 0 refills | Status: DC

## 2021-02-14 ENCOUNTER — Other Ambulatory Visit: Payer: Self-pay

## 2021-02-14 ENCOUNTER — Encounter: Payer: Self-pay | Admitting: Family Medicine

## 2021-02-14 DIAGNOSIS — F900 Attention-deficit hyperactivity disorder, predominantly inattentive type: Secondary | ICD-10-CM

## 2021-02-14 DIAGNOSIS — K219 Gastro-esophageal reflux disease without esophagitis: Secondary | ICD-10-CM

## 2021-02-14 MED ORDER — METHYLPHENIDATE HCL 20 MG PO TABS: 20.0000 mg | ORAL_TABLET | Freq: Three times a day (TID) | ORAL | 0 refills | Status: DC

## 2021-02-14 MED ORDER — PANTOPRAZOLE SODIUM 40 MG PO TBEC: 40.0000 mg | DELAYED_RELEASE_TABLET | Freq: Two times a day (BID) | ORAL | 0 refills | Status: DC

## 2021-02-14 NOTE — Telephone Encounter (Signed)
Good morning! It is coming close to my methylphenidate refill, was trying to request that on here and it will not allow me to do so. It is still saying last refill without an appointment,  which we just had. Just wanted to touch base, thanks for looking into this. I also need to request a refill on my protonix, which I am unable to request here either. Thanks again!  LFD 01/18/21 #90 with no refills LOV 12/31/20 NOV 07/08/21

## 2021-03-13 ENCOUNTER — Encounter: Payer: Self-pay | Admitting: Family Medicine

## 2021-03-13 DIAGNOSIS — F900 Attention-deficit hyperactivity disorder, predominantly inattentive type: Secondary | ICD-10-CM

## 2021-03-14 ENCOUNTER — Other Ambulatory Visit: Payer: Self-pay

## 2021-03-14 DIAGNOSIS — F900 Attention-deficit hyperactivity disorder, predominantly inattentive type: Secondary | ICD-10-CM

## 2021-03-14 MED ORDER — METHYLPHENIDATE HCL 20 MG PO TABS: 20.0000 mg | ORAL_TABLET | Freq: Three times a day (TID) | ORAL | 0 refills | Status: DC

## 2021-03-14 NOTE — Telephone Encounter (Signed)
Controlled: Methylephenidate Up to date on visits, please escribe

## 2021-03-23 ENCOUNTER — Other Ambulatory Visit: Payer: Self-pay | Admitting: Family Medicine

## 2021-04-10 ENCOUNTER — Other Ambulatory Visit: Payer: Self-pay | Admitting: Family Medicine

## 2021-04-10 DIAGNOSIS — F900 Attention-deficit hyperactivity disorder, predominantly inattentive type: Secondary | ICD-10-CM

## 2021-04-11 MED ORDER — NITROFURANTOIN MONOHYD MACRO 100 MG PO CAPS: ORAL_CAPSULE | ORAL | 0 refills | Status: DC

## 2021-04-11 MED ORDER — METHYLPHENIDATE HCL 20 MG PO TABS: 20.0000 mg | ORAL_TABLET | Freq: Three times a day (TID) | ORAL | 0 refills | Status: DC

## 2021-05-05 ENCOUNTER — Other Ambulatory Visit: Payer: Self-pay | Admitting: Family Medicine

## 2021-05-05 DIAGNOSIS — F900 Attention-deficit hyperactivity disorder, predominantly inattentive type: Secondary | ICD-10-CM

## 2021-05-06 MED ORDER — METHYLPHENIDATE HCL 20 MG PO TABS
20.0000 mg | ORAL_TABLET | Freq: Three times a day (TID) | ORAL | 0 refills | Status: DC
Start: 2021-05-06 — End: 2021-06-03

## 2021-05-25 ENCOUNTER — Encounter: Payer: Self-pay | Admitting: Family Medicine

## 2021-05-26 MED ORDER — FLUCONAZOLE 150 MG PO TABS: 150.0000 mg | ORAL_TABLET | Freq: Once | ORAL | 0 refills | Status: AC

## 2021-05-26 NOTE — Addendum Note (Signed)
Addended by: Sheliah Hatch on: 05/26/2021 07:34 AM   Modules accepted: Orders

## 2021-06-03 ENCOUNTER — Other Ambulatory Visit: Payer: Self-pay | Admitting: Family Medicine

## 2021-06-03 DIAGNOSIS — F900 Attention-deficit hyperactivity disorder, predominantly inattentive type: Secondary | ICD-10-CM

## 2021-06-06 MED ORDER — METHYLPHENIDATE HCL 20 MG PO TABS
20.0000 mg | ORAL_TABLET | Freq: Three times a day (TID) | ORAL | 0 refills | Status: DC
Start: 2021-06-06 — End: 2021-07-04

## 2021-06-17 ENCOUNTER — Encounter: Payer: Self-pay | Admitting: Emergency Medicine

## 2021-06-17 ENCOUNTER — Emergency Department
Admission: EM | Admit: 2021-06-17 | Discharge: 2021-06-17 | Disposition: A | Discharge status: Home / Self Care | Payer: BC Managed Care – PPO | Source: Home / Self Care

## 2021-06-17 ENCOUNTER — Telehealth: Payer: Self-pay | Admitting: Emergency Medicine

## 2021-06-17 ENCOUNTER — Emergency Department (INDEPENDENT_AMBULATORY_CARE_PROVIDER_SITE_OTHER): Payer: BC Managed Care – PPO

## 2021-06-17 ENCOUNTER — Other Ambulatory Visit: Payer: Self-pay

## 2021-06-17 DIAGNOSIS — J309 Allergic rhinitis, unspecified: Secondary | ICD-10-CM

## 2021-06-17 DIAGNOSIS — R059 Cough, unspecified: Secondary | ICD-10-CM

## 2021-06-17 DIAGNOSIS — R0602 Shortness of breath: Secondary | ICD-10-CM

## 2021-06-17 MED ORDER — FLUCONAZOLE 150 MG PO TABS: ORAL_TABLET | ORAL | 0 refills | Status: DC

## 2021-06-17 MED ORDER — FEXOFENADINE HCL 180 MG PO TABS: 180.0000 mg | ORAL_TABLET | Freq: Every day | ORAL | 0 refills | Status: DC

## 2021-06-17 MED ORDER — ACETAMINOPHEN 325 MG PO TABS
650.0000 mg | ORAL_TABLET | Freq: Once | ORAL | Status: AC
  Administered 2021-06-17: 11:00:00 650 mg via ORAL

## 2021-06-17 MED ORDER — PREDNISONE 20 MG PO TABS: ORAL_TABLET | ORAL | 0 refills | Status: DC

## 2021-06-17 MED ORDER — CEFDINIR 300 MG PO CAPS: 300.0000 mg | ORAL_CAPSULE | Freq: Two times a day (BID) | ORAL | 0 refills | Status: AC

## 2021-06-17 NOTE — ED Triage Notes (Signed)
Cough w/ wheezing & SOB x 1 week  Left ear pain  OTC - dayquil/nyquil - stopped yesterday, flonase & claritan daily  COVID vaccine - booster x 1  ( no bivalent )  No flu vaccine

## 2021-06-17 NOTE — Discharge Instructions (Addendum)
Advised patient today's chest x-ray was normal with no active cardiopulmonary disease.  Advised patient to take medication as directed with food to completion.  Advised patient to take Allegra and Prednisone with first dose of cefdinir for the next 5 of 7 days.  Advised may use Allegra afterwards as needed for concurrent postnasal drip/drainage. Encouraged increased water intake while taking these medications.

## 2021-06-17 NOTE — Telephone Encounter (Signed)
Pt has history of yeast infections w/ antibiotics - forgot to tell the provider- M.Ave Filter, FNP updated - see orders for diflucan

## 2021-06-17 NOTE — ED Provider Notes (Signed)
[facility]    CSN: [id number] Arrival date & time: 06/17/21  0903      History   Chief Complaint Chief Complaint  Patient presents with   Cough    HPI Suzanne Villanueva is a 40 y.o. female.   HPI 40 year old female presents with cough/wheezing, shortness of breath and left ear pain for 1 week.  PMH significant for morbid obesity and previous CHF postpartum.  Past Medical History:  Diagnosis Date   Abnormal Pap smear    Abscess    behind the right ear   Chicken pox    Complication of anesthesia    Getsvery emotional   Depression    GERD (gastroesophageal reflux disease)    Heart murmur    History of CHF (congestive heart failure)    post partum 09/2011 - echo 09/21/11: EF 60-65%, mod MR, mild LAE (due to post partum fluid shifts, HTN, obesity, ?MR)   Hypertension    Hypothyroidism    Migraines    Moderate mitral regurgitation    needs echo 03/2012   MRSA (methicillin resistant staph aureus) culture positive    PTSD (post-traumatic stress disorder)    Scarlet fever x 2   Seasonal allergies    Urinary tract infection     Patient Active Problem List   Diagnosis Date Noted   Recurrent UTI 10/06/2019   ADHD (attention deficit hyperactivity disorder), 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 Tenny Craw, MD;  Location: [facility];  Service: Gynecology;  Laterality: N/A;  Primary Cesarean Section  with birth of baby girl @ 2302   MYRINGOTOMY     TONSILLECTOMY  06/19/1988   TYMPANOSTOMY TUBE PLACEMENT     '90, '96, '12   WISDOM TOOTH EXTRACTION      OB History     Gravida  1   Para  1   Term  1   Preterm  0   AB  0   Living  1      SAB  0   IAB  0   Ectopic  0   Multiple  0   Live Births  1            Home Medications    Prior to Admission medications   Medication Sig Start Date End Date Taking? Authorizing Provider  cefdinir (OMNICEF) 300 MG capsule Take 1 capsule (300 mg total) by mouth 2 (two) times daily for 7 days. 06/17/21 06/24/21 Yes Trevor Iha, FNP  fexofenadine [facility] ALLERGY) 180 MG tablet Take 1 tablet (180 mg total) by mouth daily for 15 days. 06/17/21 07/02/21 Yes Trevor Iha, FNP  predniSONE (DELTASONE) 20 MG tablet Take 3 tabs PO daily x 5 days. 06/17/21  Yes Trevor Iha, FNP  fluconazole (DIFLUCAN) 150 MG tablet Take one dose now may repeat in 72 hours if symptoms have not resolved 06/17/21   Trevor Iha, FNP  levothyroxine (SYNTHROID) 175 MCG tablet Take 175 mcg  by mouth daily before breakfast.    [provider]  methylphenidate (RITALIN) 20 MG tablet Take 1 tablet (20 mg total) by mouth 3 (three) times daily with meals. 06/06/21   Sheliah Hatch, MD  nitrofurantoin, macrocrystal-monohydrate, (MACROBID) 100 MG capsule Take 1 capsule nightly during menstrual cycle 04/11/21   Sheliah Hatch, MD  norethindrone (MICRONOR,CAMILA,ERRIN) 0.35 MG tablet Take 1 tablet by mouth daily.    [provider]  pantoprazole (PROTONIX) 40 MG tablet Take 1 tablet (40 mg total) by mouth 2 (two) times daily. 02/14/21   Sheliah Hatch, MD  valsartan-hydrochlorothiazide (DIOVAN-HCT) 160-25 MG tablet Take 1 tablet by mouth daily. 01/11/21   Sheliah Hatch, MD    Family History Family History  Problem Relation Age of Onset   Heart attack Father    Stroke Mother    Heart disease Other        parents    Hypertension Other        parents   Arthritis Other        grandparents   Diabetes Other        parents    Social History Social History   Tobacco Use   Smoking status: Former    Packs/day: 0.50    Types: Cigarettes, E-cigarettes    Quit date: 01/14/2011    Years since quitting: 10.4   Smokeless tobacco: Never  Vaping Use   Vaping Use: Some days   Substances: Flavoring  Substance Use Topics   Alcohol use: No   Drug use: No     Allergies   Doxycycline, Amoxicillin, Diclofenac sodium, Amoxapine and related, and Other   Review of Systems Review of Systems  HENT:  Positive for ear pain.   Respiratory:  Positive for cough, shortness of breath and wheezing.   All other systems reviewed and are negative.   Physical Exam Triage Vital Signs ED Triage Vitals  Enc Vitals Group     BP 06/17/21 0923 (!) 152/98     Pulse Rate 06/17/21 0923 61     Resp 06/17/21 0923 20     Temp 06/17/21 0923 98.7 F (37.1 C)     Temp Source 06/17/21 0923 Oral     SpO2 06/17/21 0923 99 %     Weight 06/17/21 0928 300 lb (136.1 kg)     Height 06/17/21 0928 5\' 8"  (1.727 m)     Head Circumference --      Peak Flow --      Pain Score 06/17/21 0928 5     Pain Loc --      Pain Edu? --      Excl. in GC? --    No data found.  Updated Vital Signs BP (!) 152/98 (BP Location: Right Arm) Comment: R FA Comment (BP Location): took BP med pta   Pulse 61    Temp 98.7 F (37.1 C) (Oral)    Resp 20    Ht 5\' 8"  (1.727 m)    Wt 300 lb (136.1 kg)    LMP 05/30/2021    SpO2 99%    BMI 45.61 kg/m      Physical Exam Vitals reviewed.  Constitutional:      General: She is not in acute distress.    Appearance: Normal appearance. She is obese. She is not ill-appearing.  HENT:     Head: Normocephalic and atraumatic.     Right Ear: Tympanic membrane and external ear normal.     Left Ear:  Tympanic membrane and external ear normal.     Ears:     Comments: Moderate eustachian tube dysfunction noted  bilaterally    Mouth/Throat:     Mouth: Mucous membranes are moist.     Pharynx: Oropharynx is clear.     Comments: Moderate to significant amount of clear drainage of posterior oropharynx noted Eyes:     Extraocular Movements: Extraocular movements intact.     Conjunctiva/sclera: Conjunctivae normal.     Pupils: Pupils are equal, round, and reactive to light.  Cardiovascular:     Rate and Rhythm: Normal rate and regular rhythm.     Pulses: Normal pulses.     Heart sounds: Normal heart sounds. No murmur heard.   No friction rub. No gallop.  Pulmonary:     Effort: Pulmonary effort is normal.     Breath sounds: No wheezing, rhonchi or rales.     Comments: Mild inspiratory wheeze with mild rhonchi noted over right upper posterior airway and left base Musculoskeletal:     Cervical back: Normal range of motion and neck supple.  Skin:    General: Skin is warm and dry.  Neurological:     General: No focal deficit present.     Mental Status: She is alert and oriented to person, place, and time.     UC Treatments / Results  Labs (all labs ordered are listed, but only abnormal results are displayed) Labs Reviewed - No data to display  EKG   Radiology DG Chest 2 View  Result Date: 06/17/2021 CLINICAL DATA:  Cough and shortness of breath for the past week. EXAM: CHEST - 2 VIEW COMPARISON:  Chest x-ray dated September 21, 2011. FINDINGS: The heart size and mediastinal contours are within normal limits. Both lungs are clear. The visualized skeletal structures are unremarkable. IMPRESSION: No active cardiopulmonary disease. Electronically Signed   By: Obie Dredge M.D.   On: 06/17/2021 10:14    Procedures Procedures (including critical care time)  Medications Ordered in UC Medications  acetaminophen (TYLENOL) tablet 650 mg (650 mg Oral Given 06/17/21 1033)    Initial Impression / Assessment and Plan / UC Course  I have reviewed the triage vital signs and the nursing  notes.  Pertinent labs & imaging results that were available during my care of the patient were reviewed by me and considered in my medical decision making (see chart for details).     MDM: 1. Cough-CXR revealed no active cardiopulmonary disease. Advised patient today's chest x-ray was normal with no active cardiopulmonary disease, Rx'd Cefdinir and Prednisone; 2.  Allergic rhinitis-Rx'd Allegra.  Advised patient to take medication as directed with food to completion.  Advised patient to take Allegra and Prednisone with first dose of cefdinir for the next 5 of 7 days.  Advised may use Allegra afterwards as needed for concurrent postnasal drip/drainage. Encouraged increased water intake while taking these medications.  Patient discharged home, hemodynamically stable. Final Clinical Impressions(s) / UC Diagnoses   Final diagnoses:  Cough, unspecified type  Allergic rhinitis, unspecified seasonality, unspecified trigger     Discharge Instructions      Advised patient today's chest x-ray was normal with no active cardiopulmonary disease.  Advised patient to take medication as directed with food to completion.  Advised patient to take Allegra and Prednisone with first dose of cefdinir for the next 5 of 7 days.  Advised may use Allegra afterwards as needed for concurrent postnasal drip/drainage. Encouraged increased water intake while taking these medications.  ED Prescriptions     Medication Sig Dispense Auth. Provider   cefdinir (OMNICEF) 300 MG capsule Take 1 capsule (300 mg total) by mouth 2 (two) times daily for 7 days. 14 capsule Trevor Iha, FNP   predniSONE (DELTASONE) 20 MG tablet Take 3 tabs PO daily x 5 days. 15 tablet Trevor Iha, FNP   fexofenadine [facility] ALLERGY) 180 MG tablet Take 1 tablet (180 mg total) by mouth daily for 15 days. 15 tablet Trevor Iha, FNP      PDMP not reviewed this encounter.   Trevor Iha, FNP 06/17/21 [phone]

## 2021-07-02 ENCOUNTER — Other Ambulatory Visit: Payer: Self-pay | Admitting: Family Medicine

## 2021-07-04 ENCOUNTER — Other Ambulatory Visit: Payer: Self-pay | Admitting: Family Medicine

## 2021-07-04 DIAGNOSIS — F900 Attention-deficit hyperactivity disorder, predominantly inattentive type: Secondary | ICD-10-CM

## 2021-07-05 MED ORDER — METHYLPHENIDATE HCL 20 MG PO TABS: 20.0000 mg | ORAL_TABLET | Freq: Three times a day (TID) | ORAL | 0 refills | Status: DC

## 2021-07-08 ENCOUNTER — Encounter: Payer: Self-pay | Admitting: Family Medicine

## 2021-07-08 ENCOUNTER — Ambulatory Visit (INDEPENDENT_AMBULATORY_CARE_PROVIDER_SITE_OTHER): Payer: BC Managed Care – PPO | Admitting: Family Medicine

## 2021-07-08 VITALS — BP 124/90 | HR 101 | Temp 98.0°F | Resp 16 | Ht 68.0 in | Wt 330.4 lb

## 2021-07-08 DIAGNOSIS — Z0000 Encounter for general adult medical examination without abnormal findings: Secondary | ICD-10-CM | POA: Diagnosis not present

## 2021-07-08 LAB — HEPATIC FUNCTION PANEL
ALT: 18 U/L (ref 0–35)
AST: 19 U/L (ref 0–37)
Albumin: 4.4 g/dL (ref 3.5–5.2)
Alkaline Phosphatase: 64 U/L (ref 39–117)
Bilirubin, Direct: 0.2 mg/dL (ref 0.0–0.3)
Total Bilirubin: 1.3 mg/dL — ABNORMAL HIGH (ref 0.2–1.2)
Total Protein: 7.9 g/dL (ref 6.0–8.3)

## 2021-07-08 LAB — LIPID PANEL
Cholesterol: 151 mg/dL (ref 0–200)
HDL: 36.6 mg/dL — ABNORMAL LOW (ref >39.00)
LDL Cholesterol: 92 mg/dL (ref 0–99)
NonHDL: 114.76
Total CHOL/HDL Ratio: 4
Triglycerides: 114 mg/dL (ref 0.0–149.0)
VLDL: 22.8 mg/dL (ref 0.0–40.0)

## 2021-07-08 LAB — BASIC METABOLIC PANEL
BUN: 12 mg/dL (ref 6–23)
CO2: 30 mEq/L (ref 19–32)
Calcium: 9.3 mg/dL (ref 8.4–10.5)
Chloride: 100 mEq/L (ref 96–112)
Creatinine, Ser: 0.91 mg/dL (ref 0.40–1.20)
GFR: 80.09 mL/min (ref >60.00)
Glucose, Bld: 81 mg/dL (ref 70–99)
Potassium: 3.4 mEq/L — ABNORMAL LOW (ref 3.5–5.1)
Sodium: 137 mEq/L (ref 135–145)

## 2021-07-08 LAB — CBC WITH DIFFERENTIAL/PLATELET
Basophils Absolute: 0 10*3/uL (ref 0.0–0.1)
Basophils Relative: 0.3 % (ref 0.0–3.0)
Eosinophils Absolute: 0.1 10*3/uL (ref 0.0–0.7)
Eosinophils Relative: 0.8 % (ref 0.0–5.0)
HCT: 37.7 % (ref 36.0–46.0)
Hemoglobin: 12.5 g/dL (ref 12.0–15.0)
Lymphocytes Relative: 18.6 % (ref 12.0–46.0)
Lymphs Abs: 1.6 10*3/uL (ref 0.7–4.0)
MCHC: 33.1 g/dL (ref 30.0–36.0)
MCV: 85 fl (ref 78.0–100.0)
Monocytes Absolute: 0.8 10*3/uL (ref 0.1–1.0)
Monocytes Relative: 9.4 % (ref 3.0–12.0)
Neutro Abs: 5.9 10*3/uL (ref 1.4–7.7)
Neutrophils Relative %: 70.9 % (ref 43.0–77.0)
Platelets: 253 10*3/uL (ref 150.0–400.0)
RBC: 4.43 Mil/uL (ref 3.87–5.11)
RDW: 14.3 % (ref 11.5–15.5)
WBC: 8.3 10*3/uL (ref 4.0–10.5)

## 2021-07-08 LAB — VITAMIN D 25 HYDROXY (VIT D DEFICIENCY, FRACTURES): VITD: 16.84 ng/mL — ABNORMAL LOW (ref 30.00–100.00)

## 2021-07-08 LAB — TSH: TSH: 3.36 u[IU]/mL (ref 0.35–5.50)

## 2021-07-08 NOTE — Assessment & Plan Note (Signed)
Pt's PE WNL w/ exception of obesity.  Due for pap- pt to schedule.  Check labs.  Anticipatory guidance provided.  

## 2021-07-08 NOTE — Assessment & Plan Note (Signed)
Pt has lost 37 lbs since last visit!  Applauded her efforts at healthy diet and increased activity and encouraged her to continue.  Check labs to risk stratify.  Will follow.

## 2021-07-08 NOTE — Progress Notes (Signed)
° °  Subjective:    Patient ID: Suzanne Villanueva, female    DOB: 05/20/1981, 41 y.o.   MRN: [id number]  HPI CPE- UTD on Tdap. Due for pap- pt to schedule w/ GYN.  Due for flu  Patient Care Team    Relationship Specialty Notifications Start End  Midge Minium, MD PCP - General Family Medicine  10/03/13   Vanessa Kick, MD Consulting Physician Obstetrics and Gynecology  04/21/15     Health Maintenance  Topic Date Due   COVID-19 Vaccine (5 - Booster) 09/09/2019   PAP SMEAR-Modifier  08/17/2020   TETANUS/TDAP  11/16/2025   INFLUENZA VACCINE  Completed   Hepatitis C Screening  Completed   HIV Screening  Completed   HPV VACCINES  Aged Out      Review of Systems Patient reports no vision/ hearing changes, adenopathy,fever, persistant/recurrent hoarseness , swallowing issues, chest pain, palpitations, edema, persistant/recurrent cough, hemoptysis, dyspnea (rest/exertional/paroxysmal nocturnal), gastrointestinal bleeding (melena, rectal bleeding), abdominal pain, significant heartburn, bowel changes, GU symptoms (dysuria, hematuria, incontinence), Gyn symptoms (abnormal  bleeding, pain),  syncope, focal weakness, memory loss, numbness & tingling, skin/hair/nail changes, abnormal bruising or bleeding, anxiety, or depression.   +37 lb weight loss- pt is working on portion control and better food choices.  Trying to eat only when hungry and not when she's bored.  This visit occurred during the SARS-CoV-2 public health emergency.  Safety protocols were in place, including screening questions prior to the visit, additional usage of staff PPE, and extensive cleaning of exam room while observing appropriate contact time as indicated for disinfecting solutions.      Objective:   Physical Exam General Appearance:    Alert, cooperative, no distress, appears stated age, obese  Head:    Normocephalic, without obvious abnormality, atraumatic  Eyes:    PERRL, conjunctiva/corneas clear, EOM's intact,  fundi    benign, both eyes  Ears:    Normal TM's and external ear canals, both ears  Nose:   Nares normal, septum midline, mucosa normal, no drainage    or sinus tenderness  Throat:   Lips, mucosa, and tongue normal; teeth and gums normal  Neck:   Supple, symmetrical, trachea midline, no adenopathy;    Thyroid: no enlargement/tenderness/nodules  Back:     Symmetric, no curvature, ROM normal, no CVA tenderness  Lungs:     Clear to auscultation bilaterally, respirations unlabored  Chest Wall:    No tenderness or deformity   Heart:    Regular rate and rhythm, S1 and S2 normal, no murmur, rub   or gallop  Breast Exam:    Deferred to GYN  Abdomen:     Soft, non-tender, bowel sounds active all four quadrants,    no masses, no organomegaly  Genitalia:    Deferred to GYN  Rectal:    Extremities:   Extremities normal, atraumatic, no cyanosis or edema  Pulses:   2+ and symmetric all extremities  Skin:   Skin color, texture, turgor normal, no rashes or lesions  Lymph nodes:   Cervical, supraclavicular, and axillary nodes normal  Neurologic:   CNII-XII intact, normal strength, sensation and reflexes    throughout          Assessment & Plan:

## 2021-07-08 NOTE — Patient Instructions (Addendum)
Follow up in 6 months to recheck BP and weight loss progress We'll notify you of your lab results and make any changes if needed Continue to work on healthy diet and regular exercise- you're doing great!!! Call and schedule your pap and have them send me a copy of the report Call with any questions or concerns Hang in there!!!  You can do this!!

## 2021-07-11 ENCOUNTER — Other Ambulatory Visit: Payer: Self-pay

## 2021-07-11 MED ORDER — VITAMIN D (ERGOCALCIFEROL) 1.25 MG (50000 UNIT) PO CAPS: 50000.0000 [IU] | ORAL_CAPSULE | ORAL | 0 refills | Status: DC

## 2021-07-18 ENCOUNTER — Encounter: Payer: Self-pay | Admitting: Family Medicine

## 2021-07-19 MED ORDER — ALPRAZOLAM 0.25 MG PO TABS: 0.2500 mg | ORAL_TABLET | Freq: Two times a day (BID) | ORAL | 0 refills | Status: DC | PRN

## 2021-07-31 ENCOUNTER — Other Ambulatory Visit: Payer: Self-pay | Admitting: Family Medicine

## 2021-07-31 DIAGNOSIS — F900 Attention-deficit hyperactivity disorder, predominantly inattentive type: Secondary | ICD-10-CM

## 2021-08-01 MED ORDER — METHYLPHENIDATE HCL 20 MG PO TABS: 20.0000 mg | ORAL_TABLET | Freq: Three times a day (TID) | ORAL | 0 refills | Status: DC

## 2021-08-28 ENCOUNTER — Other Ambulatory Visit: Payer: Self-pay | Admitting: Family Medicine

## 2021-08-28 DIAGNOSIS — F900 Attention-deficit hyperactivity disorder, predominantly inattentive type: Secondary | ICD-10-CM

## 2021-08-28 DIAGNOSIS — K219 Gastro-esophageal reflux disease without esophagitis: Secondary | ICD-10-CM

## 2021-08-29 MED ORDER — PANTOPRAZOLE SODIUM 40 MG PO TBEC: 40.0000 mg | DELAYED_RELEASE_TABLET | Freq: Two times a day (BID) | ORAL | 0 refills | Status: DC

## 2021-08-29 MED ORDER — METHYLPHENIDATE HCL 20 MG PO TABS: 20.0000 mg | ORAL_TABLET | Freq: Three times a day (TID) | ORAL | 0 refills | Status: DC

## 2021-08-29 MED ORDER — ALPRAZOLAM 0.25 MG PO TABS: 0.2500 mg | ORAL_TABLET | Freq: Two times a day (BID) | ORAL | 0 refills | Status: DC | PRN

## 2021-09-27 ENCOUNTER — Other Ambulatory Visit: Payer: Self-pay | Admitting: Family Medicine

## 2021-09-27 DIAGNOSIS — F900 Attention-deficit hyperactivity disorder, predominantly inattentive type: Secondary | ICD-10-CM

## 2021-09-27 MED ORDER — METHYLPHENIDATE HCL 20 MG PO TABS: 20.0000 mg | ORAL_TABLET | Freq: Three times a day (TID) | ORAL | 0 refills | Status: DC

## 2021-09-27 NOTE — Telephone Encounter (Signed)
Controlled substance database reviewed.  Last methylphenidate 20 mg #90 prescribed on 08/31/2021.  Previous prescription filled 2/16, 1/17.  Consistent with no concerns on database.  Appointment in the next few months.  Refill ordered in Dr. Rennis Golden absence. ?

## 2021-09-27 NOTE — Telephone Encounter (Signed)
Patient is requesting a refill of the following medications: ?Requested Prescriptions  ? ?Pending Prescriptions Disp Refills  ? methylphenidate (RITALIN) 20 MG tablet 90 tablet 0  ?  Sig: Take 1 tablet (20 mg total) by mouth 3 (three) times daily with meals.  ? ? ?Date of patient request: 09/27/21 ?Last office visit: 08/29/21 ?Date of last refill: 07/08/21 ?Last refill amount: 90 ? ?

## 2021-10-24 ENCOUNTER — Other Ambulatory Visit: Payer: Self-pay | Admitting: Family Medicine

## 2021-10-24 DIAGNOSIS — F900 Attention-deficit hyperactivity disorder, predominantly inattentive type: Secondary | ICD-10-CM

## 2021-10-25 MED ORDER — METHYLPHENIDATE HCL 20 MG PO TABS: 20.0000 mg | ORAL_TABLET | Freq: Three times a day (TID) | ORAL | 0 refills | Status: DC

## 2021-10-25 NOTE — Telephone Encounter (Signed)
Patient is requesting a refill of the following medications: ?Requested Prescriptions  ? ?Pending Prescriptions Disp Refills  ? methylphenidate (RITALIN) 20 MG tablet 90 tablet 0  ?  Sig: Take 1 tablet (20 mg total) by mouth 3 (three) times daily with meals.  ? ? ?Date of patient request: 10/25/21 ?Last office visit: 07/08/21 ?Date of last refill: 09/27/21 ?Last refill amount: 90 ? ? ?

## 2021-11-24 ENCOUNTER — Other Ambulatory Visit: Payer: Self-pay | Admitting: Family Medicine

## 2021-11-24 DIAGNOSIS — F900 Attention-deficit hyperactivity disorder, predominantly inattentive type: Secondary | ICD-10-CM

## 2021-11-25 MED ORDER — METHYLPHENIDATE HCL 20 MG PO TABS: 20.0000 mg | ORAL_TABLET | Freq: Three times a day (TID) | ORAL | 0 refills | Status: DC

## 2021-11-25 NOTE — Telephone Encounter (Signed)
Patient is requesting a refill of the following medications: Requested Prescriptions   Pending Prescriptions Disp Refills   methylphenidate (RITALIN) 20 MG tablet 90 tablet 0    Sig: Take 1 tablet (20 mg total) by mouth 3 (three) times daily with meals.    Date of patient request: 11/25/21 Last office visit: 07/08/21 Date of last refill: 10/25/21 Last refill amount: 90

## 2021-11-26 ENCOUNTER — Other Ambulatory Visit: Payer: Self-pay | Admitting: Family Medicine

## 2021-11-26 DIAGNOSIS — K219 Gastro-esophageal reflux disease without esophagitis: Secondary | ICD-10-CM

## 2021-12-23 ENCOUNTER — Other Ambulatory Visit: Payer: Self-pay | Admitting: Family Medicine

## 2021-12-23 DIAGNOSIS — F900 Attention-deficit hyperactivity disorder, predominantly inattentive type: Secondary | ICD-10-CM

## 2021-12-23 MED ORDER — METHYLPHENIDATE HCL 20 MG PO TABS: 20.0000 mg | ORAL_TABLET | Freq: Three times a day (TID) | ORAL | 0 refills | Status: DC

## 2021-12-23 NOTE — Telephone Encounter (Signed)
Patient is requesting a refill of the following medications: Requested Prescriptions   Pending Prescriptions Disp Refills   methylphenidate (RITALIN) 20 MG tablet 90 tablet 0    Sig: Take 1 tablet (20 mg total) by mouth 3 (three) times daily with meals.    Date of patient request: 12/23/21 Last office visit: 07/08/21 Date of last refill: 11/25/21 Last refill amount: 90

## 2021-12-23 NOTE — Telephone Encounter (Signed)
Suzanne Villanueva sent this in today .

## 2021-12-23 NOTE — Telephone Encounter (Signed)
PDMP reviewed. Last refill on 11/25/21 for #90 (30 day supply) with no refills.  Refilling on on behalf of Dr. Laurence Slate, NP

## 2022-01-03 ENCOUNTER — Other Ambulatory Visit: Payer: Self-pay | Admitting: Family Medicine

## 2022-01-06 ENCOUNTER — Encounter: Payer: Self-pay | Admitting: Family Medicine

## 2022-01-06 ENCOUNTER — Ambulatory Visit: Payer: BC Managed Care – PPO | Admitting: Family Medicine

## 2022-01-06 VITALS — BP 128/80 | HR 74 | Temp 98.2°F | Resp 16 | Ht 68.0 in | Wt 316.0 lb

## 2022-01-06 DIAGNOSIS — E039 Hypothyroidism, unspecified: Secondary | ICD-10-CM | POA: Diagnosis not present

## 2022-01-06 DIAGNOSIS — I10 Essential (primary) hypertension: Secondary | ICD-10-CM

## 2022-01-06 DIAGNOSIS — L309 Dermatitis, unspecified: Secondary | ICD-10-CM | POA: Diagnosis not present

## 2022-01-06 LAB — BASIC METABOLIC PANEL
BUN: 13 mg/dL (ref 6–23)
CO2: 31 mEq/L (ref 19–32)
Calcium: 9.5 mg/dL (ref 8.4–10.5)
Chloride: 100 mEq/L (ref 96–112)
Creatinine, Ser: 1.02 mg/dL (ref 0.40–1.20)
GFR: 69.6 mL/min (ref >60.00)
Glucose, Bld: 96 mg/dL (ref 70–99)
Potassium: 3.6 mEq/L (ref 3.5–5.1)
Sodium: 139 mEq/L (ref 135–145)

## 2022-01-06 LAB — CBC WITH DIFFERENTIAL/PLATELET
Basophils Absolute: 0 10*3/uL (ref 0.0–0.1)
Basophils Relative: 0.4 % (ref 0.0–3.0)
Eosinophils Absolute: 0.1 10*3/uL (ref 0.0–0.7)
Eosinophils Relative: 0.7 % (ref 0.0–5.0)
HCT: 37.5 % (ref 36.0–46.0)
Hemoglobin: 12.6 g/dL (ref 12.0–15.0)
Lymphocytes Relative: 19.7 % (ref 12.0–46.0)
Lymphs Abs: 1.4 10*3/uL (ref 0.7–4.0)
MCHC: 33.5 g/dL (ref 30.0–36.0)
MCV: 85.8 fl (ref 78.0–100.0)
Monocytes Absolute: 0.5 10*3/uL (ref 0.1–1.0)
Monocytes Relative: 7.4 % (ref 3.0–12.0)
Neutro Abs: 5.2 10*3/uL (ref 1.4–7.7)
Neutrophils Relative %: 71.8 % (ref 43.0–77.0)
Platelets: 217 10*3/uL (ref 150.0–400.0)
RBC: 4.37 Mil/uL (ref 3.87–5.11)
RDW: 14.8 % (ref 11.5–15.5)
WBC: 7.3 10*3/uL (ref 4.0–10.5)

## 2022-01-06 LAB — HEPATIC FUNCTION PANEL
ALT: 16 U/L (ref 0–35)
AST: 16 U/L (ref 0–37)
Albumin: 4.7 g/dL (ref 3.5–5.2)
Alkaline Phosphatase: 67 U/L (ref 39–117)
Bilirubin, Direct: 0.3 mg/dL (ref 0.0–0.3)
Total Bilirubin: 1.3 mg/dL — ABNORMAL HIGH (ref 0.2–1.2)
Total Protein: 8.2 g/dL (ref 6.0–8.3)

## 2022-01-06 LAB — TSH: TSH: 3.2 u[IU]/mL (ref 0.35–5.50)

## 2022-01-06 LAB — LIPID PANEL
Cholesterol: 142 mg/dL (ref 0–200)
HDL: 36.6 mg/dL — ABNORMAL LOW (ref >39.00)
LDL Cholesterol: 91 mg/dL (ref 0–99)
NonHDL: 105.32
Total CHOL/HDL Ratio: 4
Triglycerides: 74 mg/dL (ref 0.0–149.0)
VLDL: 14.8 mg/dL (ref 0.0–40.0)

## 2022-01-06 MED ORDER — TRIAMCINOLONE ACETONIDE 0.1 % EX OINT: 1.0000 | TOPICAL_OINTMENT | Freq: Two times a day (BID) | CUTANEOUS | 1 refills | Status: AC

## 2022-01-06 NOTE — Progress Notes (Signed)
   Subjective:    Patient ID: Suzanne Villanueva, female    DOB: [birth year], 41 y.o.   MRN: [id number]  HPI HTN- chronic problem, on Valsartan HCTZ 160/25mg  daily w/ good control.  No CP, SOB, HAs, visual changes, edema.  Obesity- pt is down 14 lbs since last visit.  BMI now 48.05.  pt reports portion control.  Not exercising.    Hypothyroid- not currently on medication but TSH has been elevated in the past.  Denies fatigue, changes to skin/hair/nails  Rash- R posterior lower leg.  Started a few months ago.  Sxs are intermittent.  No pain.  Just itching.   Review of Systems For ROS see HPI     Objective:   Physical Exam Vitals reviewed.  Constitutional:      General: She is not in acute distress.    Appearance: Normal appearance. She is well-developed. She is obese. She is not ill-appearing.  HENT:     Head: Normocephalic and atraumatic.  Eyes:     Conjunctiva/sclera: Conjunctivae normal.     Pupils: Pupils are equal, round, and reactive to light.  Neck:     Thyroid: No thyromegaly.  Cardiovascular:     Rate and Rhythm: Normal rate and regular rhythm.     Pulses: Normal pulses.     Heart sounds: Normal heart sounds. No murmur heard. Pulmonary:     Effort: Pulmonary effort is normal. No respiratory distress.     Breath sounds: Normal breath sounds.  Abdominal:     General: There is no distension.     Palpations: Abdomen is soft.     Tenderness: There is no abdominal tenderness.  Musculoskeletal:     Cervical back: Normal range of motion and neck supple.     Right lower leg: No edema.     Left lower leg: No edema.  Lymphadenopathy:     Cervical: No cervical adenopathy.  Skin:    General: Skin is warm and dry.     Findings: Rash (eczematous rash on R posterior lower leg) present.  Neurological:     Mental Status: She is alert and oriented to person, place, and time.  Psychiatric:        Behavior: Behavior normal.           Assessment & Plan:  Rash- consistent  w/ eczema.  Start topical Triamcinolone.  Pt expressed understanding and is in agreement w/ plan.

## 2022-01-06 NOTE — Assessment & Plan Note (Signed)
Improving.  Pt is down 14 lbs since last visit.  Pt reports she has been working on portion control.  Knows she needs to add regular exercise.  Applauded her efforts.  Will continue to follow.

## 2022-01-06 NOTE — Patient Instructions (Addendum)
Schedule your complete physical in 6 months We'll notify you of your lab results and make any changes if needed Keep up the good work on healthy diet and try and add regular exercise!! Use the Triamcinolone ointment twice daily on the itchy areas Call with any questions or concerns Stay Safe!  Stay Healthy! Have a great summer!!!

## 2022-01-06 NOTE — Assessment & Plan Note (Signed)
Chronic problem.  Pt's last TSH WNL but this has fluctuated in the past.  Check labs and determine if medication is needed.

## 2022-01-06 NOTE — Assessment & Plan Note (Signed)
Chronic problem.  Currently well controlled on Valsartan HCTZ 160/25mg  daily.  Asymptomatic.  Check labs due to ARB and HCTZ use but no anticipated med changes.  Will follow.

## 2022-01-09 ENCOUNTER — Encounter: Payer: Self-pay | Admitting: Family Medicine

## 2022-01-09 ENCOUNTER — Telehealth: Payer: Self-pay

## 2022-01-09 NOTE — Telephone Encounter (Signed)
Informed pt of lab results  

## 2022-01-09 NOTE — Telephone Encounter (Signed)
-----   Message from Sheliah Hatch, MD sent at 01/09/2022  7:31 AM EDT ----- Labs look good!  No changes at this time

## 2022-01-09 NOTE — Telephone Encounter (Signed)
-----   Message from Katherine E Tabori, MD sent at 01/09/2022  7:31 AM EDT ----- Labs look good!  No changes at this time 

## 2022-01-23 ENCOUNTER — Encounter: Payer: Self-pay | Admitting: Family Medicine

## 2022-01-23 ENCOUNTER — Other Ambulatory Visit: Payer: Self-pay | Admitting: Family

## 2022-01-23 DIAGNOSIS — F900 Attention-deficit hyperactivity disorder, predominantly inattentive type: Secondary | ICD-10-CM

## 2022-01-23 MED ORDER — METHYLPHENIDATE HCL 20 MG PO TABS
20.0000 mg | ORAL_TABLET | Freq: Three times a day (TID) | ORAL | 0 refills | Status: DC
Start: 2022-01-23 — End: 2022-02-22

## 2022-01-23 NOTE — Telephone Encounter (Signed)
Patient is requesting a refill of the following medications: Requested Prescriptions   Pending Prescriptions Disp Refills   methylphenidate (RITALIN) 20 MG tablet 90 tablet 0    Sig: Take 1 tablet (20 mg total) by mouth 3 (three) times daily with meals.    Date of patient request: 01/23/22 Last office visit: 01/06/22 Date of last refill: 12/23/21 Last refill amount: 90

## 2022-02-21 ENCOUNTER — Encounter: Payer: Self-pay | Admitting: Family Medicine

## 2022-02-21 DIAGNOSIS — F900 Attention-deficit hyperactivity disorder, predominantly inattentive type: Secondary | ICD-10-CM

## 2022-02-22 MED ORDER — METHYLPHENIDATE HCL 20 MG PO TABS: 20.0000 mg | ORAL_TABLET | Freq: Three times a day (TID) | ORAL | 0 refills | Status: DC

## 2022-03-20 ENCOUNTER — Other Ambulatory Visit: Payer: Self-pay | Admitting: Family Medicine

## 2022-03-20 DIAGNOSIS — F900 Attention-deficit hyperactivity disorder, predominantly inattentive type: Secondary | ICD-10-CM

## 2022-03-20 MED ORDER — METHYLPHENIDATE HCL 20 MG PO TABS: 20.0000 mg | ORAL_TABLET | Freq: Three times a day (TID) | ORAL | 0 refills | Status: DC

## 2022-04-14 LAB — HM PAP SMEAR

## 2022-04-20 ENCOUNTER — Other Ambulatory Visit: Payer: Self-pay | Admitting: Family Medicine

## 2022-04-20 DIAGNOSIS — F900 Attention-deficit hyperactivity disorder, predominantly inattentive type: Secondary | ICD-10-CM

## 2022-04-21 MED ORDER — METHYLPHENIDATE HCL 20 MG PO TABS: 20.0000 mg | ORAL_TABLET | Freq: Three times a day (TID) | ORAL | 0 refills | Status: DC

## 2022-04-21 NOTE — Telephone Encounter (Signed)
Patient is requesting a refill of the following medications: Requested Prescriptions   Pending Prescriptions Disp Refills   methylphenidate (RITALIN) 20 MG tablet 90 tablet 0    Sig: Take 1 tablet (20 mg total) by mouth 3 (three) times daily with meals.    Date of patient request: 04/21/22 Last office visit: 01/06/22 Date of last refill: 03/20/22 Last refill amount: 90 Follow up time period per chart: 6 months

## 2022-04-25 ENCOUNTER — Encounter: Payer: Self-pay | Admitting: Family Medicine

## 2022-05-19 ENCOUNTER — Other Ambulatory Visit: Payer: Self-pay | Admitting: Family Medicine

## 2022-05-19 DIAGNOSIS — F900 Attention-deficit hyperactivity disorder, predominantly inattentive type: Secondary | ICD-10-CM

## 2022-05-19 MED ORDER — METHYLPHENIDATE HCL 20 MG PO TABS: 20.0000 mg | ORAL_TABLET | Freq: Three times a day (TID) | ORAL | 0 refills | Status: DC

## 2022-05-19 NOTE — Telephone Encounter (Signed)
Patient is requesting a refill of the following medications: Requested Prescriptions   Pending Prescriptions Disp Refills   methylphenidate (RITALIN) 20 MG tablet 90 tablet 0    Sig: Take 1 tablet (20 mg total) by mouth 3 (three) times daily with meals.    Date of patient request: 05/19/22 Last office visit: 01/06/22 Date of last refill: 04/21/22 Last refill amount: 90

## 2022-06-15 ENCOUNTER — Encounter: Payer: Self-pay | Admitting: Family Medicine

## 2022-06-20 ENCOUNTER — Other Ambulatory Visit: Payer: Self-pay | Admitting: Family Medicine

## 2022-06-20 DIAGNOSIS — F900 Attention-deficit hyperactivity disorder, predominantly inattentive type: Secondary | ICD-10-CM

## 2022-06-20 MED ORDER — METHYLPHENIDATE HCL 20 MG PO TABS: 20.0000 mg | ORAL_TABLET | Freq: Three times a day (TID) | ORAL | 0 refills | Status: DC

## 2022-06-20 NOTE — Telephone Encounter (Signed)
Patient is requesting a refill of the following medications: Requested Prescriptions   Pending Prescriptions Disp Refills   methylphenidate (RITALIN) 20 MG tablet 90 tablet 0    Sig: Take 1 tablet (20 mg total) by mouth 3 (three) times daily with meals.    Date of patient request: 06/20/22/ Last office visit: 01/06/22 Date of last refill: 05/19/22 Last refill amount: 90

## 2022-06-22 ENCOUNTER — Telehealth: Payer: Self-pay | Admitting: Family Medicine

## 2022-06-22 NOTE — Telephone Encounter (Signed)
Methylphenidate 20 mg Pt needs [facility] sent to [facility] [address] [place] due to her pharmacy does not have her medication in stock

## 2022-06-22 NOTE — Telephone Encounter (Signed)
Caller name: Matasha Smigelski  On DPR?: Yes  Call back number: [phone] (mobile)  Provider they see: Midge Minium, MD  Reason for call: Patient called stating her pharmacy is out of stock of her medication methylphenidate 20 mg. Patient [facility] a new prescription [facility] [address], [place].

## 2022-06-23 ENCOUNTER — Encounter: Payer: Self-pay | Admitting: Family Medicine

## 2022-06-23 DIAGNOSIS — F900 Attention-deficit hyperactivity disorder, predominantly inattentive type: Secondary | ICD-10-CM

## 2022-06-23 NOTE — Telephone Encounter (Signed)
If you get any time today to send this it would be appreciated

## 2022-06-26 ENCOUNTER — Encounter: Payer: Self-pay | Admitting: Family Medicine

## 2022-06-26 MED ORDER — METHYLPHENIDATE HCL 20 MG PO TABS: 20.0000 mg | ORAL_TABLET | Freq: Three times a day (TID) | ORAL | 0 refills | Status: DC

## 2022-06-26 NOTE — Telephone Encounter (Signed)
Sent this morning to requested pharmacy

## 2022-07-15 ENCOUNTER — Other Ambulatory Visit: Payer: Self-pay | Admitting: Family Medicine

## 2022-07-21 ENCOUNTER — Encounter: Payer: BC Managed Care – PPO | Admitting: Family Medicine

## 2022-08-18 ENCOUNTER — Other Ambulatory Visit: Payer: Self-pay | Admitting: Family Medicine

## 2022-08-18 DIAGNOSIS — F900 Attention-deficit hyperactivity disorder, predominantly inattentive type: Secondary | ICD-10-CM

## 2022-08-18 MED ORDER — METHYLPHENIDATE HCL 20 MG PO TABS: 20.0000 mg | ORAL_TABLET | Freq: Three times a day (TID) | ORAL | 0 refills | Status: DC

## 2022-08-18 NOTE — Telephone Encounter (Signed)
Encourage patient to contact the pharmacy for refills or they can request refills through [place] TO: [id number] [place] 20 MG tablet   NOTES/COMMENTS FROM PATIENT:Couldn't use MyChart to refill       [place] notify [place]: It takes 48-72 hours to process rx refill requests Ask patient to call pharmacy to ensure rx is ready before heading there.

## 2022-08-18 NOTE — Telephone Encounter (Signed)
Ritalin 20 mg  LOV: 01/06/22 Last Refill:06/26/22 Upcoming appt: 10/02/22

## 2022-08-18 NOTE — Telephone Encounter (Signed)
Pt aware that Rx has been sent in

## 2022-09-18 ENCOUNTER — Other Ambulatory Visit: Payer: Self-pay | Admitting: Family Medicine

## 2022-09-18 DIAGNOSIS — F900 Attention-deficit hyperactivity disorder, predominantly inattentive type: Secondary | ICD-10-CM

## 2022-09-18 MED ORDER — METHYLPHENIDATE HCL 20 MG PO TABS: 20.0000 mg | ORAL_TABLET | Freq: Three times a day (TID) | ORAL | 0 refills | Status: DC

## 2022-09-18 NOTE — Telephone Encounter (Signed)
Ritalin 10  mg LOV: 01/06/22 Last Refill:08/18/22 Upcoming appt: 10/02/22

## 2022-10-02 ENCOUNTER — Encounter: Payer: Self-pay | Admitting: Family Medicine

## 2022-10-02 ENCOUNTER — Ambulatory Visit (INDEPENDENT_AMBULATORY_CARE_PROVIDER_SITE_OTHER): Payer: BC Managed Care – PPO | Admitting: Family Medicine

## 2022-10-02 VITALS — BP 136/86 | HR 58 | Temp 98.0°F | Resp 18 | Ht 67.0 in | Wt 339.5 lb

## 2022-10-02 DIAGNOSIS — Z0001 Encounter for general adult medical examination with abnormal findings: Secondary | ICD-10-CM | POA: Diagnosis not present

## 2022-10-02 DIAGNOSIS — M79671 Pain in right foot: Secondary | ICD-10-CM | POA: Diagnosis not present

## 2022-10-02 DIAGNOSIS — Z0000 Encounter for general adult medical examination without abnormal findings: Secondary | ICD-10-CM

## 2022-10-02 DIAGNOSIS — E559 Vitamin D deficiency, unspecified: Secondary | ICD-10-CM | POA: Diagnosis not present

## 2022-10-02 DIAGNOSIS — B369 Superficial mycosis, unspecified: Secondary | ICD-10-CM

## 2022-10-02 LAB — HEPATIC FUNCTION PANEL
ALT: 14 U/L (ref 0–35)
AST: 15 U/L (ref 0–37)
Albumin: 4.3 g/dL (ref 3.5–5.2)
Alkaline Phosphatase: 56 U/L (ref 39–117)
Bilirubin, Direct: 0.1 mg/dL (ref 0.0–0.3)
Total Bilirubin: 0.7 mg/dL (ref 0.2–1.2)
Total Protein: 7.5 g/dL (ref 6.0–8.3)

## 2022-10-02 LAB — VITAMIN D 25 HYDROXY (VIT D DEFICIENCY, FRACTURES): VITD: 16.17 ng/mL — ABNORMAL LOW (ref 30.00–100.00)

## 2022-10-02 LAB — CBC WITH DIFFERENTIAL/PLATELET
Basophils Absolute: 0 10*3/uL (ref 0.0–0.1)
Basophils Relative: 0.6 % (ref 0.0–3.0)
Eosinophils Absolute: 0.1 10*3/uL (ref 0.0–0.7)
Eosinophils Relative: 1.8 % (ref 0.0–5.0)
HCT: 35.2 % — ABNORMAL LOW (ref 36.0–46.0)
Hemoglobin: 11.8 g/dL — ABNORMAL LOW (ref 12.0–15.0)
Lymphocytes Relative: 33.8 % (ref 12.0–46.0)
Lymphs Abs: 1.9 10*3/uL (ref 0.7–4.0)
MCHC: 33.6 g/dL (ref 30.0–36.0)
MCV: 85.5 fl (ref 78.0–100.0)
Monocytes Absolute: 0.5 10*3/uL (ref 0.1–1.0)
Monocytes Relative: 8.1 % (ref 3.0–12.0)
Neutro Abs: 3.1 10*3/uL (ref 1.4–7.7)
Neutrophils Relative %: 55.7 % (ref 43.0–77.0)
Platelets: 226 10*3/uL (ref 150.0–400.0)
RBC: 4.12 Mil/uL (ref 3.87–5.11)
RDW: 14.2 % (ref 11.5–15.5)
WBC: 5.6 10*3/uL (ref 4.0–10.5)

## 2022-10-02 LAB — LIPID PANEL
Cholesterol: 144 mg/dL (ref 0–200)
HDL: 42.7 mg/dL (ref >39.00)
LDL Cholesterol: 90 mg/dL (ref 0–99)
NonHDL: 101.14
Total CHOL/HDL Ratio: 3
Triglycerides: 55 mg/dL (ref 0.0–149.0)
VLDL: 11 mg/dL (ref 0.0–40.0)

## 2022-10-02 LAB — BASIC METABOLIC PANEL
BUN: 17 mg/dL (ref 6–23)
CO2: 29 mEq/L (ref 19–32)
Calcium: 9 mg/dL (ref 8.4–10.5)
Chloride: 103 mEq/L (ref 96–112)
Creatinine, Ser: 0.87 mg/dL (ref 0.40–1.20)
GFR: 83.8 mL/min (ref >60.00)
Glucose, Bld: 96 mg/dL (ref 70–99)
Potassium: 4.2 mEq/L (ref 3.5–5.1)
Sodium: 137 mEq/L (ref 135–145)

## 2022-10-02 LAB — TSH: TSH: 5.92 u[IU]/mL — ABNORMAL HIGH (ref 0.35–5.50)

## 2022-10-02 MED ORDER — NYSTATIN 100000 UNIT/GM EX POWD: 1.0000 | Freq: Three times a day (TID) | CUTANEOUS | 3 refills | Status: DC

## 2022-10-02 NOTE — Assessment & Plan Note (Signed)
Deteriorated.  Pt has gained 24 lbs since last visit.  She is aware of her weight gain and disappointed by it.  Encouraged her to get back on track w/ diet and exercise bc she has been able to lose the weight in the past.  Check labs to risk stratify.  Will follow.

## 2022-10-02 NOTE — Assessment & Plan Note (Signed)
Check labs and replete prn. 

## 2022-10-02 NOTE — Progress Notes (Signed)
   Subjective:    Patient ID: Suzanne Villanueva, female    DOB: 04/19/1981, 42 y.o.   MRN: [id number]  HPI CPE- UTD on pap, Tdap  Patient Care Team    Relationship Specialty Notifications Start End  Sheliah Hatch, MD PCP - General Family Medicine  10/03/13   Waynard Reeds, MD Consulting Physician Obstetrics and Gynecology  04/21/15      Health Maintenance  Topic Date Due   INFLUENZA VACCINE  01/18/2023   PAP SMEAR-Modifier  04/14/2025   DTaP/Tdap/Td (3 - Td or Tdap) 11/16/2025   Hepatitis C Screening  Completed   HIV Screening  Completed   HPV VACCINES  Aged Out   COVID-19 Vaccine  Discontinued      Review of Systems Patient reports no vision/ hearing changes, adenopathy,fever, persistant/recurrent hoarseness , swallowing issues, chest pain, palpitations, edema, persistant/recurrent cough, hemoptysis, dyspnea (rest/exertional/paroxysmal nocturnal), gastrointestinal bleeding (melena, rectal bleeding), abdominal pain, significant heartburn, bowel changes, GU symptoms (dysuria, hematuria, incontinence), Gyn symptoms (abnormal  bleeding, pain),  syncope, focal weakness, memory loss, numbness & tingling, skin/hair/nail changes, abnormal bruising or bleeding, anxiety, or depression.   +24 lb weight gain R foot pain- pt reports she has hx of plantar fasciitis.  Again having difficulty getting going in the morning.  This is interfering w/ her ability to walk/exercise. + rash in panty line- occasionally itchy, some burning.  Has used chafing powder    Objective:   Physical Exam General Appearance:    Alert, cooperative, no distress, appears stated age, obese  Head:    Normocephalic, without obvious abnormality, atraumatic  Eyes:    PERRL, conjunctiva/corneas clear, EOM's intact both eyes  Ears:    Normal TM's and external ear canals, both ears  Nose:   Nares normal, septum midline, mucosa normal, no drainage    or sinus tenderness  Throat:   Lips, mucosa, and tongue normal; teeth and  gums normal  Neck:   Supple, symmetrical, trachea midline, no adenopathy;    Thyroid: no enlargement/tenderness/nodules  Back:     Symmetric, no curvature, ROM normal, no CVA tenderness  Lungs:     Clear to auscultation bilaterally, respirations unlabored  Chest Wall:    No tenderness or deformity   Heart:    Regular rate and rhythm, S1 and S2 normal, no murmur, rub   or gallop  Breast Exam:    Deferred to GYN  Abdomen:     Soft, non-tender, bowel sounds active all four quadrants,    no masses, no organomegaly  Genitalia:    Deferred to GYN  Rectal:    Extremities:   Extremities normal, atraumatic, no cyanosis or edema  Pulses:   2+ and symmetric all extremities  Skin:   Skin color, texture, turgor normal.  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  Likely plantar fasciitis given description of pain.  Reviewed importance of ice, stretching, and supportive footwear.  Referral to podiatry placed.  Pt expressed understanding and is in agreement w/ plan.   Fungal dermatitis- new.  In groin creases bilaterally.  Start topical Nystatin powder and encouraged her to keep creases clean and most importantly dry.  Will follow.

## 2022-10-02 NOTE — Assessment & Plan Note (Signed)
Pt's PE WNL w/ exception of  obesity and R foot pain.  UTD on pap, Tdap.  Check labs.  Anticipatory guidance provided.

## 2022-10-02 NOTE — Patient Instructions (Signed)
Follow up in 6 months to recheck BP We'll notify you of your lab results and make any changes if needed USE the Nystatin powder 2-3x/day on the red/raw areas Continue to work on healthy diet and regular exercise- you can do it!!! We'll call you to schedule your podiatry appt Call with any questions or concerns Hang in there!!!

## 2022-10-03 MED ORDER — LEVOTHYROXINE SODIUM 50 MCG PO TABS: 50.0000 ug | ORAL_TABLET | Freq: Every day | ORAL | 2 refills | Status: DC

## 2022-10-03 MED ORDER — VITAMIN D (ERGOCALCIFEROL) 1.25 MG (50000 UNIT) PO CAPS: 50000.0000 [IU] | ORAL_CAPSULE | ORAL | 0 refills | Status: DC

## 2022-10-03 NOTE — Addendum Note (Signed)
Addended by: Eldred Manges on: 10/03/2022 08:10 AM   Modules accepted: Orders

## 2022-10-12 ENCOUNTER — Ambulatory Visit: Payer: BC Managed Care – PPO | Admitting: Podiatry

## 2022-10-12 ENCOUNTER — Encounter: Payer: Self-pay | Admitting: Podiatry

## 2022-10-12 ENCOUNTER — Ambulatory Visit (INDEPENDENT_AMBULATORY_CARE_PROVIDER_SITE_OTHER): Payer: BC Managed Care – PPO

## 2022-10-12 DIAGNOSIS — M722 Plantar fascial fibromatosis: Secondary | ICD-10-CM | POA: Diagnosis not present

## 2022-10-12 DIAGNOSIS — M79671 Pain in right foot: Secondary | ICD-10-CM | POA: Diagnosis not present

## 2022-10-12 MED ORDER — DEXAMETHASONE SODIUM PHOSPHATE 120 MG/30ML IJ SOLN
4.0000 mg | Freq: Once | INTRAMUSCULAR | Status: AC
Start: 2022-10-12 — End: 2022-10-12
  Administered 2022-10-12: 4 mg via INTRA_ARTICULAR

## 2022-10-12 MED ORDER — MELOXICAM 15 MG PO TABS
15.0000 mg | ORAL_TABLET | Freq: Every day | ORAL | 0 refills | Status: DC
Start: 2022-10-12 — End: 2022-11-09

## 2022-10-12 NOTE — Patient Instructions (Signed)

## 2022-10-12 NOTE — Progress Notes (Signed)
  Subjective:  Patient ID: Suzanne Villanueva, female    DOB: Jan 28, 1981,   MRN: [id number]  Chief Complaint  Patient presents with   Plantar Fasciitis    Patient came in today for right foot heel and lateral side of the foot pain, started 3 months ago, stabbing sharp pain, rate of pain 7 out of 10, mornings and after sitting for awhile is when she has the most pain,     42 y.o. female presents for concern as above. Relates she has been stretching and wearing supportive shoes but not getting any relief. . Denies any other pedal complaints. Denies n/v/f/c.   Past Medical History:  Diagnosis Date   Abnormal Pap smear    Abscess    behind the right ear   Chicken pox    Complication of anesthesia    Getsvery emotional   Depression    GERD (gastroesophageal reflux disease)    Heart murmur    History of CHF (congestive heart failure)    post partum 09/2011 - echo 09/21/11: EF 60-65%, mod MR, mild LAE (due to post partum fluid shifts, HTN, obesity, ?MR)   Hypertension    Hypothyroidism    Migraines    Moderate mitral regurgitation    needs echo 03/2012   MRSA (methicillin resistant staph aureus) culture positive    PTSD (post-traumatic stress disorder)    Scarlet fever x 2   Seasonal allergies    Urinary tract infection     Objective:  Physical Exam: Vascular: DP/PT pulses 2/4 bilateral. CFT <3 seconds. Normal hair growth on digits. No edema.  Skin. No lacerations or abrasions bilateral feet.  Musculoskeletal: MMT 5/5 bilateral lower extremities in DF, PF, Inversion and Eversion. Deceased ROM in DF of ankle joint.  Tender to medial calcaneal tubercle and into the central area of the heel. Some tenderness to the achilles. No pain along arch or PT tendon. No pain with calcaneal squeeze.  Neurological: Sensation intact to light touch.   Assessment:   1. Plantar fasciitis, right   2. Right foot pain [M79.671]      Plan:  Patient was evaluated and treated and all questions  answered. Discussed plantar fasciitis with patient.  X-rays reviewed and discussed with patient. No acute fractures or dislocations noted. Mild spurring noted at inferior calcaneus.  Reviewed noted from PCP Discussed treatment options including, ice, NSAIDS, supportive shoes, bracing, and stretching. Stretching exercises provided to be done on a daily basis.   Prescription for meloxicam provided and sent to pharmacy. PF brace dispensed.  Patient requesting injection today. Procedure note below.   Follow-up 6 weeks or sooner if any problems arise. In the meantime, encouraged to call the office with any questions, concerns, change in symptoms.   Procedure:  Discussed etiology, pathology, conservative vs. surgical therapies. At this time a plantar fascial injection was recommended.  The patient agreed and a sterile skin prep was applied.  An injection consisting of  dexamethasone and marcaine mixture was infiltrated at the point of maximal tenderness on the right Heel.  Bandaid applied. The patient tolerated this well and was given instructions for aftercare.     Louann Sjogren, DPM

## 2022-10-14 ENCOUNTER — Other Ambulatory Visit: Payer: Self-pay | Admitting: Family Medicine

## 2022-10-14 DIAGNOSIS — F900 Attention-deficit hyperactivity disorder, predominantly inattentive type: Secondary | ICD-10-CM

## 2022-10-16 MED ORDER — METHYLPHENIDATE HCL 20 MG PO TABS: 20.0000 mg | ORAL_TABLET | Freq: Three times a day (TID) | ORAL | 0 refills | Status: DC

## 2022-10-16 NOTE — Telephone Encounter (Signed)
Left vm stating rx has been set in

## 2022-10-16 NOTE — Telephone Encounter (Signed)
Ritalin 20 mg LOV: 10/02/22 Last Refill:09/18/22 Upcoming appt: 04/06/23

## 2022-11-09 ENCOUNTER — Other Ambulatory Visit: Payer: Self-pay | Admitting: Family Medicine

## 2022-11-09 ENCOUNTER — Other Ambulatory Visit: Payer: Self-pay | Admitting: Podiatry

## 2022-11-09 DIAGNOSIS — K219 Gastro-esophageal reflux disease without esophagitis: Secondary | ICD-10-CM

## 2022-11-09 DIAGNOSIS — F900 Attention-deficit hyperactivity disorder, predominantly inattentive type: Secondary | ICD-10-CM

## 2022-11-09 MED ORDER — METHYLPHENIDATE HCL 20 MG PO TABS: 20.0000 mg | ORAL_TABLET | Freq: Three times a day (TID) | ORAL | 0 refills | Status: DC

## 2022-11-09 MED ORDER — PANTOPRAZOLE SODIUM 40 MG PO TBEC: 40.0000 mg | DELAYED_RELEASE_TABLET | Freq: Two times a day (BID) | ORAL | 0 refills | Status: DC

## 2022-11-09 MED ORDER — ALPRAZOLAM 0.25 MG PO TABS: 0.2500 mg | ORAL_TABLET | Freq: Two times a day (BID) | ORAL | 0 refills | Status: DC | PRN

## 2022-11-09 NOTE — Telephone Encounter (Signed)
Left Vm that rx has been sent in

## 2022-11-09 NOTE — Telephone Encounter (Signed)
Xanax 0. 25 mg LOV: 10/02/22 Last Refill:08/30/22 Upcoming appt: 04/06/23  Ritalin 20 mg Filled 10/16/22

## 2022-11-09 NOTE — Telephone Encounter (Signed)
Patient requested refill of meloxicam.  It was last prescribed in April 2024.  Reviewed last note.  Refill was approved for patient. - Cleo Santucci

## 2022-11-24 ENCOUNTER — Ambulatory Visit: Payer: BC Managed Care – PPO | Admitting: Podiatry

## 2022-11-24 ENCOUNTER — Encounter: Payer: Self-pay | Admitting: Podiatry

## 2022-11-24 DIAGNOSIS — M722 Plantar fascial fibromatosis: Secondary | ICD-10-CM | POA: Diagnosis not present

## 2022-11-24 NOTE — Progress Notes (Signed)
  Subjective:  Patient ID: Suzanne Villanueva, female    DOB: 08-Apr-1981,   MRN: [id number]  Chief Complaint  Patient presents with   Plantar Fasciitis    Right foot pain much better since the last visit injection was helpful and also PF brace is helpful    42 y.o. female presents for follow-up of right foot plantar fasciitis. Relates doing much better and injection was helpful and the brace has been very helpful. . Denies any other pedal complaints. Denies n/v/f/c.   Past Medical History:  Diagnosis Date   Abnormal Pap smear    Abscess    behind the right ear   Chicken pox    Complication of anesthesia    Getsvery emotional   Depression    GERD (gastroesophageal reflux disease)    Heart murmur    History of CHF (congestive heart failure)    post partum 09/2011 - echo 09/21/11: EF 60-65%, mod MR, mild LAE (due to post partum fluid shifts, HTN, obesity, ?MR)   Hypertension    Hypothyroidism    Migraines    Moderate mitral regurgitation    needs echo 03/2012   MRSA (methicillin resistant staph aureus) culture positive    PTSD (post-traumatic stress disorder)    Scarlet fever x 2   Seasonal allergies    Urinary tract infection     Objective:  Physical Exam: Vascular: DP/PT pulses 2/4 bilateral. CFT <3 seconds. Normal hair growth on digits. No edema.  Skin. No lacerations or abrasions bilateral feet.  Musculoskeletal: MMT 5/5 bilateral lower extremities in DF, PF, Inversion and Eversion. Deceased ROM in DF of ankle joint.  Tender to lateral calcaneal tubercle and into the central area of the heel. Some tenderness to the achilles. No pain along arch or PT tendon. No pain with calcaneal squeeze.  Neurological: Sensation intact to light touch.   Assessment:   1. Plantar fasciitis, right       Plan:  Patient was evaluated and treated and all questions answered. Discussed plantar fasciitis with patient.  X-rays reviewed and discussed with patient. No acute fractures or  dislocations noted. Mild spurring noted at inferior calcaneus.  Reviewed noted from PCP Discussed treatment options including, ice, NSAIDS, supportive shoes, bracing, and stretching.  Continue stretching and brace.  Continue meloxicam as needed.  Discussed if any worsening can try another shot but none needed today.  Follow-up 6 weeks or sooner if any problems arise. In the meantime, encouraged to call the office with any questions, concerns, change in symptoms.     Louann Sjogren, DPM

## 2022-12-12 ENCOUNTER — Other Ambulatory Visit: Payer: Self-pay | Admitting: Family Medicine

## 2022-12-12 ENCOUNTER — Other Ambulatory Visit: Payer: Self-pay | Admitting: Podiatry

## 2022-12-12 DIAGNOSIS — F900 Attention-deficit hyperactivity disorder, predominantly inattentive type: Secondary | ICD-10-CM

## 2022-12-13 MED ORDER — METHYLPHENIDATE HCL 20 MG PO TABS: 20.0000 mg | ORAL_TABLET | Freq: Three times a day (TID) | ORAL | 0 refills | Status: DC

## 2022-12-13 NOTE — Telephone Encounter (Signed)
Ritalin 20 mg LOV: 10/02/22 Last Refill:11/09/22 Upcoming appt: 04/06/23

## 2022-12-13 NOTE — Telephone Encounter (Signed)
Pt aware of refill.

## 2022-12-17 MED ORDER — MELOXICAM 15 MG PO TABS: 15.0000 mg | ORAL_TABLET | Freq: Every day | ORAL | 0 refills | Status: DC

## 2023-01-01 ENCOUNTER — Encounter: Payer: Self-pay | Admitting: Family Medicine

## 2023-01-01 ENCOUNTER — Other Ambulatory Visit: Payer: Self-pay

## 2023-01-01 DIAGNOSIS — Z1231 Encounter for screening mammogram for malignant neoplasm of breast: Secondary | ICD-10-CM

## 2023-01-01 NOTE — Addendum Note (Signed)
Addended by: Sheliah Hatch on: 01/01/2023 03:51 PM   Modules accepted: Orders

## 2023-01-05 ENCOUNTER — Other Ambulatory Visit: Payer: Self-pay | Admitting: Family Medicine

## 2023-01-10 ENCOUNTER — Other Ambulatory Visit: Payer: Self-pay | Admitting: Family Medicine

## 2023-01-10 DIAGNOSIS — F900 Attention-deficit hyperactivity disorder, predominantly inattentive type: Secondary | ICD-10-CM

## 2023-01-10 NOTE — Telephone Encounter (Signed)
Patient is requesting a refill of the following medications: Requested Prescriptions   Pending Prescriptions Disp Refills   methylphenidate (RITALIN) 20 MG tablet 90 tablet 0    Sig: Take 1 tablet (20 mg total) by mouth 3 (three) times daily with meals.    Date of patient request: 01/10/23 Last office visit: 10/02/22 Date of last refill: 12/13/22 Last refill amount: 90 Follow up time period per chart: 6 months

## 2023-01-11 MED ORDER — METHYLPHENIDATE HCL 20 MG PO TABS: 20.0000 mg | ORAL_TABLET | Freq: Three times a day (TID) | ORAL | 0 refills | Status: DC

## 2023-01-23 ENCOUNTER — Encounter: Payer: Self-pay | Admitting: Family Medicine

## 2023-01-24 ENCOUNTER — Other Ambulatory Visit: Payer: Self-pay | Admitting: Family Medicine

## 2023-01-24 MED ORDER — NITROFURANTOIN MONOHYD MACRO 100 MG PO CAPS: ORAL_CAPSULE | ORAL | 3 refills | Status: DC

## 2023-01-24 NOTE — Progress Notes (Signed)
Prescription sent as requested via MyChart

## 2023-02-07 ENCOUNTER — Ambulatory Visit (INDEPENDENT_AMBULATORY_CARE_PROVIDER_SITE_OTHER): Payer: BC Managed Care – PPO

## 2023-02-07 DIAGNOSIS — Z1231 Encounter for screening mammogram for malignant neoplasm of breast: Secondary | ICD-10-CM

## 2023-02-08 ENCOUNTER — Other Ambulatory Visit: Payer: Self-pay | Admitting: Family Medicine

## 2023-02-08 ENCOUNTER — Encounter: Payer: Self-pay | Admitting: Family Medicine

## 2023-02-09 ENCOUNTER — Other Ambulatory Visit: Payer: Self-pay | Admitting: Family Medicine

## 2023-02-09 ENCOUNTER — Encounter: Payer: Self-pay | Admitting: Obstetrics and Gynecology

## 2023-02-09 DIAGNOSIS — R928 Other abnormal and inconclusive findings on diagnostic imaging of breast: Secondary | ICD-10-CM

## 2023-02-11 ENCOUNTER — Other Ambulatory Visit: Payer: Self-pay | Admitting: Family Medicine

## 2023-02-11 DIAGNOSIS — F900 Attention-deficit hyperactivity disorder, predominantly inattentive type: Secondary | ICD-10-CM

## 2023-02-12 MED ORDER — METHYLPHENIDATE HCL 20 MG PO TABS
20.0000 mg | ORAL_TABLET | Freq: Three times a day (TID) | ORAL | 0 refills | Status: DC
Start: 2023-02-12 — End: 2023-03-13

## 2023-02-12 NOTE — Telephone Encounter (Signed)
Ritalin 20 mg Requested Prescriptions   Pending Prescriptions Disp Refills   methylphenidate (RITALIN) 20 MG tablet 90 tablet 0    Sig: Take 1 tablet (20 mg total) by mouth 3 (three) times daily with meals.    / Da/te of patient request: 02/12/23 Last office visit: 10/02/22 Date of last refill: 01/11/23 Last refill amount: 90 Follow up time period per chart: 6 months

## 2023-02-20 ENCOUNTER — Ambulatory Visit
Admission: RE | Admit: 2023-02-20 | Discharge: 2023-02-20 | Disposition: A | Discharge status: Home / Self Care | Payer: BC Managed Care – PPO | Source: Ambulatory Visit | Attending: Family Medicine | Admitting: Family Medicine

## 2023-02-20 ENCOUNTER — Other Ambulatory Visit: Payer: Self-pay | Admitting: Family Medicine

## 2023-02-20 DIAGNOSIS — R928 Other abnormal and inconclusive findings on diagnostic imaging of breast: Secondary | ICD-10-CM

## 2023-02-20 DIAGNOSIS — N6310 Unspecified lump in the right breast, unspecified quadrant: Secondary | ICD-10-CM

## 2023-02-21 ENCOUNTER — Other Ambulatory Visit: Payer: Self-pay | Admitting: Family Medicine

## 2023-02-21 ENCOUNTER — Ambulatory Visit: Admission: RE | Admit: 2023-02-21 | Payer: BC Managed Care – PPO | Source: Ambulatory Visit

## 2023-02-21 DIAGNOSIS — N6310 Unspecified lump in the right breast, unspecified quadrant: Secondary | ICD-10-CM

## 2023-02-21 DIAGNOSIS — R928 Other abnormal and inconclusive findings on diagnostic imaging of breast: Secondary | ICD-10-CM

## 2023-02-21 HISTORY — PX: BREAST BIOPSY: SHX20

## 2023-02-22 ENCOUNTER — Encounter: Payer: Self-pay | Admitting: Family Medicine

## 2023-02-23 ENCOUNTER — Other Ambulatory Visit: Payer: Self-pay | Admitting: Family Medicine

## 2023-02-23 DIAGNOSIS — K219 Gastro-esophageal reflux disease without esophagitis: Secondary | ICD-10-CM

## 2023-02-26 ENCOUNTER — Ambulatory Visit
Admission: RE | Admit: 2023-02-26 | Discharge: 2023-02-26 | Disposition: A | Discharge status: Home / Self Care | Payer: BC Managed Care – PPO | Source: Ambulatory Visit | Attending: Family Medicine | Admitting: Family Medicine

## 2023-02-26 DIAGNOSIS — N6310 Unspecified lump in the right breast, unspecified quadrant: Secondary | ICD-10-CM

## 2023-02-26 DIAGNOSIS — R928 Other abnormal and inconclusive findings on diagnostic imaging of breast: Secondary | ICD-10-CM

## 2023-02-26 HISTORY — PX: BREAST BIOPSY: SHX20

## 2023-03-13 ENCOUNTER — Other Ambulatory Visit: Payer: Self-pay | Admitting: Family Medicine

## 2023-03-13 DIAGNOSIS — F900 Attention-deficit hyperactivity disorder, predominantly inattentive type: Secondary | ICD-10-CM

## 2023-03-13 NOTE — Telephone Encounter (Signed)
Patient is requesting a refill of the following medications: Requested Prescriptions   Pending Prescriptions Disp Refills   methylphenidate (RITALIN) 20 MG tablet 90 tablet 0    Sig: Take 1 tablet (20 mg total) by mouth 3 (three) times daily with meals.    Date of patient request: 03/13/23 Last office visit: 10/02/22 Date of last refill: 02/12/23 Last refill amount: 90 Follow up time period per chart: 6 months

## 2023-03-14 MED ORDER — METHYLPHENIDATE HCL 20 MG PO TABS
20.0000 mg | ORAL_TABLET | Freq: Three times a day (TID) | ORAL | 0 refills | Status: DC
Start: 2023-03-14 — End: 2023-04-10

## 2023-03-16 ENCOUNTER — Encounter: Payer: Self-pay | Admitting: Family Medicine

## 2023-03-19 ENCOUNTER — Telehealth: Payer: Self-pay

## 2023-03-19 ENCOUNTER — Other Ambulatory Visit (HOSPITAL_COMMUNITY): Payer: Self-pay

## 2023-03-19 NOTE — Telephone Encounter (Signed)
Pharmacy Patient Advocate Encounter   Received notification from Physician's Office that prior authorization for Methylphenidate is required/requested.   Insurance verification completed.   The patient is insured through [facility] .   Per test claim: PA required; PA submitted to [facility] via CoverMyMeds Key/confirmation #/EOC (Key: [id number]) Status is pending

## 2023-03-21 ENCOUNTER — Other Ambulatory Visit (HOSPITAL_COMMUNITY): Payer: Self-pay

## 2023-03-21 NOTE — Telephone Encounter (Signed)
Called and LM to inform the patient

## 2023-03-21 NOTE — Telephone Encounter (Signed)
Pharmacy Patient Advocate Encounter  Received notification from [facility] that Prior Authorization for {Methylphenidate  has been APPROVED from 9.30.24 to 9.29.27. Ran test claim, and the Rx was last filled on 03/15/23 and is payable again on/after 04/08/23.   This test claim was processed through [facility]- copay amounts may vary at other pharmacies due to pharmacy/plan contracts, or as the patient moves through the different stages of their insurance plan.   PA #/Case ID/Reference #: (Key: [id number])

## 2023-03-28 ENCOUNTER — Other Ambulatory Visit: Payer: Self-pay | Admitting: Family Medicine

## 2023-03-28 DIAGNOSIS — K219 Gastro-esophageal reflux disease without esophagitis: Secondary | ICD-10-CM

## 2023-03-28 MED ORDER — PANTOPRAZOLE SODIUM 40 MG PO TBEC: 40.0000 mg | DELAYED_RELEASE_TABLET | Freq: Two times a day (BID) | ORAL | 0 refills | Status: DC

## 2023-03-29 ENCOUNTER — Encounter: Payer: Self-pay | Admitting: Family Medicine

## 2023-04-06 ENCOUNTER — Ambulatory Visit: Payer: BC Managed Care – PPO | Admitting: Family Medicine

## 2023-04-07 ENCOUNTER — Other Ambulatory Visit: Payer: Self-pay | Admitting: Family Medicine

## 2023-04-10 ENCOUNTER — Other Ambulatory Visit: Payer: Self-pay | Admitting: Family Medicine

## 2023-04-10 DIAGNOSIS — F900 Attention-deficit hyperactivity disorder, predominantly inattentive type: Secondary | ICD-10-CM

## 2023-04-11 MED ORDER — METHYLPHENIDATE HCL 20 MG PO TABS
20.0000 mg | ORAL_TABLET | Freq: Three times a day (TID) | ORAL | 0 refills | Status: DC
Start: 2023-04-11 — End: 2023-05-15

## 2023-04-11 NOTE — Telephone Encounter (Signed)
Last refill 03/14/2023 Last office visit 10/02/2022

## 2023-04-27 ENCOUNTER — Ambulatory Visit: Payer: BC Managed Care – PPO | Admitting: Family Medicine

## 2023-05-01 ENCOUNTER — Other Ambulatory Visit: Payer: Self-pay | Admitting: Family Medicine

## 2023-05-15 ENCOUNTER — Other Ambulatory Visit: Payer: Self-pay | Admitting: Family Medicine

## 2023-05-15 DIAGNOSIS — F900 Attention-deficit hyperactivity disorder, predominantly inattentive type: Secondary | ICD-10-CM

## 2023-05-15 MED ORDER — METHYLPHENIDATE HCL 20 MG PO TABS
20.0000 mg | ORAL_TABLET | Freq: Three times a day (TID) | ORAL | 0 refills | Status: DC
Start: 2023-05-15 — End: 2023-06-11

## 2023-05-15 NOTE — Telephone Encounter (Signed)
Requested Prescriptions  Pending Prescriptions Disp Refills  methylphenidate (RITALIN) 20 MG tablet 90 tablet 0   Sig: Take 1 tablet (20 mg total) by mouth 3 (three) times daily with meals.  Last office visit 09/2022

## 2023-05-21 ENCOUNTER — Other Ambulatory Visit: Payer: Self-pay | Admitting: Family Medicine

## 2023-06-06 ENCOUNTER — Ambulatory Visit: Payer: BC Managed Care – PPO | Admitting: Family Medicine

## 2023-06-06 ENCOUNTER — Encounter: Payer: Self-pay | Admitting: Family Medicine

## 2023-06-06 VITALS — BP 128/78 | HR 78 | Temp 98.5°F | Ht 67.0 in | Wt 349.2 lb

## 2023-06-06 DIAGNOSIS — Z6841 Body Mass Index (BMI) 40.0 and over, adult: Secondary | ICD-10-CM

## 2023-06-06 DIAGNOSIS — I10 Essential (primary) hypertension: Secondary | ICD-10-CM

## 2023-06-06 DIAGNOSIS — E039 Hypothyroidism, unspecified: Secondary | ICD-10-CM | POA: Diagnosis not present

## 2023-06-06 LAB — CBC WITH DIFFERENTIAL/PLATELET
Basophils Absolute: 0 10*3/uL (ref 0.0–0.1)
Basophils Relative: 0.8 % (ref 0.0–3.0)
Eosinophils Absolute: 0.1 10*3/uL (ref 0.0–0.7)
Eosinophils Relative: 1.8 % (ref 0.0–5.0)
HCT: 36.4 % (ref 36.0–46.0)
Hemoglobin: 12 g/dL (ref 12.0–15.0)
Lymphocytes Relative: 22.1 % (ref 12.0–46.0)
Lymphs Abs: 1.3 10*3/uL (ref 0.7–4.0)
MCHC: 32.9 g/dL (ref 30.0–36.0)
MCV: 87.8 fL (ref 78.0–100.0)
Monocytes Absolute: 0.7 10*3/uL (ref 0.1–1.0)
Monocytes Relative: 10.8 % (ref 3.0–12.0)
Neutro Abs: 3.9 10*3/uL (ref 1.4–7.7)
Neutrophils Relative %: 64.5 % (ref 43.0–77.0)
Platelets: 240 10*3/uL (ref 150.0–400.0)
RBC: 4.15 Mil/uL (ref 3.87–5.11)
RDW: 14.2 % (ref 11.5–15.5)
WBC: 6.1 10*3/uL (ref 4.0–10.5)

## 2023-06-06 LAB — LIPID PANEL
Cholesterol: 135 mg/dL (ref 0–200)
HDL: 38.7 mg/dL — ABNORMAL LOW (ref >39.00)
LDL Cholesterol: 82 mg/dL (ref 0–99)
NonHDL: 96.55
Total CHOL/HDL Ratio: 3
Triglycerides: 75 mg/dL (ref 0.0–149.0)
VLDL: 15 mg/dL (ref 0.0–40.0)

## 2023-06-06 LAB — HEPATIC FUNCTION PANEL
ALT: 18 U/L (ref 0–35)
AST: 18 U/L (ref 0–37)
Albumin: 4.1 g/dL (ref 3.5–5.2)
Alkaline Phosphatase: 52 U/L (ref 39–117)
Bilirubin, Direct: 0.2 mg/dL (ref 0.0–0.3)
Total Bilirubin: 1 mg/dL (ref 0.2–1.2)
Total Protein: 7.5 g/dL (ref 6.0–8.3)

## 2023-06-06 LAB — TSH: TSH: 4.41 u[IU]/mL (ref 0.35–5.50)

## 2023-06-06 LAB — BASIC METABOLIC PANEL
BUN: 12 mg/dL (ref 6–23)
CO2: 28 meq/L (ref 19–32)
Calcium: 9.1 mg/dL (ref 8.4–10.5)
Chloride: 103 meq/L (ref 96–112)
Creatinine, Ser: 0.83 mg/dL (ref 0.40–1.20)
GFR: 88.25 mL/min (ref >60.00)
Glucose, Bld: 85 mg/dL (ref 70–99)
Potassium: 3.7 meq/L (ref 3.5–5.1)
Sodium: 137 meq/L (ref 135–145)

## 2023-06-06 LAB — HEMOGLOBIN A1C: Hgb A1c MFr Bld: 5.6 % (ref 4.6–6.5)

## 2023-06-06 MED ORDER — ALPRAZOLAM 0.25 MG PO TABS: 0.2500 mg | ORAL_TABLET | Freq: Two times a day (BID) | ORAL | 3 refills | Status: AC | PRN

## 2023-06-06 NOTE — Progress Notes (Signed)
   Subjective:    Patient ID: Suzanne Villanueva, female    DOB: Jun 20, 1980, 42 y.o.   MRN: [id number]  HPI HTN- chronic problem, on Valsartan hydrochlorothiazide 160/25mg  daily.  Denies CP, SOB, HA's, visual changes, edema.  Hypothyroid- chronic problem, on Levothyroxine daily.  + weight gain and fatigue.  Obesity- pt has gained 10 lbs since April.  Lost FIL last month and had to move in w/ MIL who has stage 4 lung cancer.  Since his death, she admits that her eating has gone off the rails and she is no longer walking.   Review of Systems For ROS see HPI     Objective:   Physical Exam Vitals reviewed.  Constitutional:      General: She is not in acute distress.    Appearance: Normal appearance. She is well-developed. She is obese. She is not ill-appearing.  HENT:     Head: Normocephalic and atraumatic.  Eyes:     Conjunctiva/sclera: Conjunctivae normal.     Pupils: Pupils are equal, round, and reactive to light.  Neck:     Thyroid: No thyromegaly.  Cardiovascular:     Rate and Rhythm: Normal rate and regular rhythm.     Pulses: Normal pulses.     Heart sounds: Normal heart sounds. No murmur heard. Pulmonary:     Effort: Pulmonary effort is normal. No respiratory distress.     Breath sounds: Normal breath sounds.  Abdominal:     General: There is no distension.     Palpations: Abdomen is soft.     Tenderness: There is no abdominal tenderness.  Musculoskeletal:     Cervical back: Normal range of motion and neck supple.     Right lower leg: No edema.     Left lower leg: No edema.  Lymphadenopathy:     Cervical: No cervical adenopathy.  Skin:    General: Skin is warm and dry.  Neurological:     General: No focal deficit present.     Mental Status: She is alert and oriented to person, place, and time.  Psychiatric:        Mood and Affect: Mood normal.        Behavior: Behavior normal.        Thought Content: Thought content normal.           Assessment &  Plan:

## 2023-06-06 NOTE — Assessment & Plan Note (Signed)
Deteriorated.  Pt has gained 10 lbs since last visit.  She admits that diet and exercise habits are not good since the death of her FIL.  Plans to get back on track.  Check labs to risk stratify.  Will follow.

## 2023-06-06 NOTE — Assessment & Plan Note (Signed)
Ongoing issue.  Currently on Levothyroxine daily.  She has weight gain and fatigue but she feels this is likely situational with her recent family stress.  Check labs.  Adjust meds prn

## 2023-06-06 NOTE — Assessment & Plan Note (Signed)
Chronic problem.  Currently well controlled on Valsartan hydrochlorothiazide 160/25mg  daily.  Asymptomatic.  Check labs due to ARB and diuretic use but no anticipated med changes.

## 2023-06-06 NOTE — Patient Instructions (Signed)
Schedule your complete physical in 6 months We'll notify you of your lab results and make any changes if needed Continue to work on healthy diet and regular exercise- you can do it! Call with any questions or concerns Stay Safe!  Stay Healthy! Hang in there! Happy Holidays!!

## 2023-06-08 ENCOUNTER — Ambulatory Visit: Payer: BC Managed Care – PPO | Admitting: Podiatry

## 2023-06-08 ENCOUNTER — Telehealth: Payer: Self-pay

## 2023-06-08 NOTE — Telephone Encounter (Signed)
-----   Message from Neena Rhymes sent at 06/08/2023  4:03 PM EST ----- Labs look great!  No changes at this time

## 2023-06-11 ENCOUNTER — Other Ambulatory Visit: Payer: Self-pay | Admitting: Family Medicine

## 2023-06-11 DIAGNOSIS — F900 Attention-deficit hyperactivity disorder, predominantly inattentive type: Secondary | ICD-10-CM

## 2023-06-11 MED ORDER — METHYLPHENIDATE HCL 20 MG PO TABS
20.0000 mg | ORAL_TABLET | Freq: Three times a day (TID) | ORAL | 0 refills | Status: DC
Start: 2023-06-11 — End: 2023-07-14

## 2023-06-11 NOTE — Telephone Encounter (Signed)
Pt has reviewed via MyChart

## 2023-06-11 NOTE — Telephone Encounter (Signed)
Requested Prescriptions   Pending Prescriptions Disp Refills   methylphenidate (RITALIN) 20 MG tablet 90 tablet 0    Sig: Take 1 tablet (20 mg total) by mouth 3 (three) times daily with meals.     Date of patient request: 06/11/23 Last office visit: 06/06/2023 Upcoming visit: 10/12/2023 Date of last refill: 05/15/2023 Last refill amount: 90

## 2023-06-15 ENCOUNTER — Ambulatory Visit: Payer: BC Managed Care – PPO | Admitting: Podiatry

## 2023-06-15 ENCOUNTER — Encounter: Payer: Self-pay | Admitting: Podiatry

## 2023-06-15 DIAGNOSIS — M722 Plantar fascial fibromatosis: Secondary | ICD-10-CM | POA: Diagnosis not present

## 2023-06-15 MED ORDER — TRIAMCINOLONE ACETONIDE 10 MG/ML IJ SUSP
2.5000 mg | Freq: Once | INTRAMUSCULAR | Status: AC
Start: 2023-06-15 — End: 2023-06-15
  Administered 2023-06-15: 2.5 mg via INTRA_ARTICULAR

## 2023-06-15 MED ORDER — DEXAMETHASONE SODIUM PHOSPHATE 120 MG/30ML IJ SOLN
4.0000 mg | Freq: Once | INTRAMUSCULAR | Status: AC
Start: 2023-06-15 — End: 2023-06-15
  Administered 2023-06-15: 4 mg via INTRA_ARTICULAR

## 2023-06-15 NOTE — Progress Notes (Signed)
  Subjective:  Patient ID: Suzanne Villanueva, female    DOB: 07/29/1980,   MRN: [id number]  No chief complaint on file.   42 y.o. female presents for concern of reoccurance of the plantar fasciitis on the right.  Relates she was doing very well and not even needing the brace. However she had to wear a different pair of shoes for an event and this set off the foot and pain returned. PF brace broke and here to see what else can be done. Has been stretching. . Denies any other pedal complaints. Denies n/v/f/c.   Past Medical History:  Diagnosis Date   Abnormal Pap smear    Abscess    behind the right ear   Chicken pox    Complication of anesthesia    Getsvery emotional   Depression    GERD (gastroesophageal reflux disease)    Heart murmur    History of CHF (congestive heart failure)    post partum 09/2011 - echo 09/21/11: EF 60-65%, mod MR, mild LAE (due to post partum fluid shifts, HTN, obesity, ?MR)   Hypertension    Hypothyroidism    Migraines    Moderate mitral regurgitation    needs echo 03/2012   MRSA (methicillin resistant staph aureus) culture positive    PTSD (post-traumatic stress disorder)    Scarlet fever x 2   Seasonal allergies    Urinary tract infection     Objective:  Physical Exam: Vascular: DP/PT pulses 2/4 bilateral. CFT <3 seconds. Normal hair growth on digits. No edema.  Skin. No lacerations or abrasions bilateral feet.  Musculoskeletal: MMT 5/5 bilateral lower extremities in DF, PF, Inversion and Eversion. Deceased ROM in DF of ankle joint.  Tender to medial calcaneal tubercle and into the central area of the heel. Some tenderness to the achilles. No pain along arch or PT tendon. No pain with calcaneal squeeze.  Neurological: Sensation intact to light touch.   Assessment:   1. Plantar fasciitis, right       Plan:  Patient was evaluated and treated and all questions answered. Discussed plantar fasciitis with patient.  X-rays reviewed and discussed with  patient. No acute fractures or dislocations noted. Mild spurring noted at inferior calcaneus.  Reviewed noted from PCP Discussed treatment options including, ice, NSAIDS, supportive shoes, bracing, and stretching.  Continue stretching.  PF brace dispensed again. Previous one was broken and no longer effective.  Patient requesting injection today. Procedure note below.   Follow-up as needed  Procedure:  Discussed etiology, pathology, conservative vs. surgical therapies. At this time a plantar fascial injection was recommended.  The patient agreed and a sterile skin prep was applied.  An injection consisting of  dexamethasone and marcaine mixture was infiltrated at the point of maximal tenderness on the right Heel.  Bandaid applied. The patient tolerated this well and was given instructions for aftercare.     Louann Sjogren, DPM

## 2023-07-08 ENCOUNTER — Other Ambulatory Visit: Payer: Self-pay | Admitting: Family Medicine

## 2023-07-14 ENCOUNTER — Other Ambulatory Visit: Payer: Self-pay | Admitting: Family Medicine

## 2023-07-14 DIAGNOSIS — F900 Attention-deficit hyperactivity disorder, predominantly inattentive type: Secondary | ICD-10-CM

## 2023-07-16 MED ORDER — METHYLPHENIDATE HCL 20 MG PO TABS
20.0000 mg | ORAL_TABLET | Freq: Three times a day (TID) | ORAL | 0 refills | Status: DC
Start: 2023-07-16 — End: 2023-08-16

## 2023-08-11 ENCOUNTER — Other Ambulatory Visit: Payer: Self-pay | Admitting: Family Medicine

## 2023-08-16 ENCOUNTER — Other Ambulatory Visit: Payer: Self-pay | Admitting: Family Medicine

## 2023-08-16 DIAGNOSIS — F900 Attention-deficit hyperactivity disorder, predominantly inattentive type: Secondary | ICD-10-CM

## 2023-08-16 NOTE — Telephone Encounter (Signed)
 Requested Prescriptions   Pending Prescriptions Disp Refills   methylphenidate (RITALIN) 20 MG tablet 90 tablet 0    Sig: Take 1 tablet (20 mg total) by mouth 3 (three) times daily with meals.     Date of patient request: 08/16/2023 Last office visit: 06/06/2023 Upcoming visit: 10/12/2023 Date of last refill: 07/16/2023 Last refill amount: 90

## 2023-08-17 MED ORDER — METHYLPHENIDATE HCL 20 MG PO TABS
20.0000 mg | ORAL_TABLET | Freq: Three times a day (TID) | ORAL | 0 refills | Status: DC
Start: 2023-08-17 — End: 2023-09-11

## 2023-09-11 ENCOUNTER — Other Ambulatory Visit: Payer: Self-pay | Admitting: Family Medicine

## 2023-09-11 DIAGNOSIS — F900 Attention-deficit hyperactivity disorder, predominantly inattentive type: Secondary | ICD-10-CM

## 2023-09-11 DIAGNOSIS — K219 Gastro-esophageal reflux disease without esophagitis: Secondary | ICD-10-CM

## 2023-09-11 MED ORDER — PANTOPRAZOLE SODIUM 40 MG PO TBEC
40.0000 mg | DELAYED_RELEASE_TABLET | Freq: Two times a day (BID) | ORAL | 0 refills | Status: DC
Start: 2023-09-11 — End: 2023-12-13

## 2023-09-11 MED ORDER — METHYLPHENIDATE HCL 20 MG PO TABS
20.0000 mg | ORAL_TABLET | Freq: Three times a day (TID) | ORAL | 0 refills | Status: DC
Start: 2023-09-11 — End: 2023-09-14

## 2023-09-11 NOTE — Telephone Encounter (Signed)
 Requested Prescriptions   Pending Prescriptions Disp Refills   methylphenidate (RITALIN) 20 MG tablet 90 tablet 0    Sig: Take 1 tablet (20 mg total) by mouth 3 (three) times daily with meals.   Signed Prescriptions Disp Refills   pantoprazole (PROTONIX) 40 MG tablet 180 tablet 0    Sig: Take 1 tablet (40 mg total) by mouth 2 (two) times daily.    Authorizing Provider: Sheliah Hatch    Ordering User: Eldred Manges     Date of patient request: 09/11/2023 Last office visit: 06/06/2023 Upcoming visit: 10/12/2023 Date of last refill: 08/17/2023 Last refill amount: 90

## 2023-09-14 ENCOUNTER — Other Ambulatory Visit: Payer: Self-pay | Admitting: Family Medicine

## 2023-09-14 DIAGNOSIS — F900 Attention-deficit hyperactivity disorder, predominantly inattentive type: Secondary | ICD-10-CM

## 2023-09-14 MED ORDER — METHYLPHENIDATE HCL 20 MG PO TABS
20.0000 mg | ORAL_TABLET | Freq: Three times a day (TID) | ORAL | 0 refills | Status: DC
Start: 2023-09-14 — End: 2023-10-18

## 2023-10-12 ENCOUNTER — Encounter: Payer: BC Managed Care – PPO | Admitting: Family Medicine

## 2023-10-18 ENCOUNTER — Other Ambulatory Visit: Payer: Self-pay | Admitting: Student in an Organized Health Care Education/Training Program

## 2023-10-18 DIAGNOSIS — F900 Attention-deficit hyperactivity disorder, predominantly inattentive type: Secondary | ICD-10-CM

## 2023-10-19 MED ORDER — METHYLPHENIDATE HCL 20 MG PO TABS
20.0000 mg | ORAL_TABLET | Freq: Three times a day (TID) | ORAL | 0 refills | Status: DC
Start: 2023-10-19 — End: 2023-11-19

## 2023-10-19 NOTE — Telephone Encounter (Signed)
 Requested Prescriptions   Pending Prescriptions Disp Refills   methylphenidate  (RITALIN ) 20 MG tablet 90 tablet 0    Sig: Take 1 tablet (20 mg total) by mouth 3 (three) times daily with meals.     Date of patient request: 10/19/2023 Last office visit: Visit date not found Upcoming visit: Visit date not found Date of last refill: 09/14/2023 Last refill amount: 90

## 2023-11-17 ENCOUNTER — Encounter: Payer: Self-pay | Admitting: Family Medicine

## 2023-11-17 DIAGNOSIS — F900 Attention-deficit hyperactivity disorder, predominantly inattentive type: Secondary | ICD-10-CM

## 2023-11-19 MED ORDER — METHYLPHENIDATE HCL 20 MG PO TABS
20.0000 mg | ORAL_TABLET | Freq: Three times a day (TID) | ORAL | 0 refills | Status: DC
Start: 2023-11-19 — End: 2023-12-19

## 2023-11-19 NOTE — Addendum Note (Signed)
 Addended by: Sobia Karger E on: 11/19/2023 03:41 PM   Modules accepted: Orders

## 2023-12-03 ENCOUNTER — Encounter: Payer: BC Managed Care – PPO | Admitting: Family Medicine

## 2023-12-06 ENCOUNTER — Other Ambulatory Visit: Payer: Self-pay | Admitting: Family Medicine

## 2023-12-13 ENCOUNTER — Other Ambulatory Visit: Payer: Self-pay | Admitting: Family Medicine

## 2023-12-13 DIAGNOSIS — K219 Gastro-esophageal reflux disease without esophagitis: Secondary | ICD-10-CM

## 2023-12-19 ENCOUNTER — Encounter: Payer: Self-pay | Admitting: Family Medicine

## 2023-12-19 DIAGNOSIS — F900 Attention-deficit hyperactivity disorder, predominantly inattentive type: Secondary | ICD-10-CM

## 2023-12-19 MED ORDER — METHYLPHENIDATE HCL 20 MG PO TABS
20.0000 mg | ORAL_TABLET | Freq: Three times a day (TID) | ORAL | 0 refills | Status: DC
Start: 2023-12-19 — End: 2024-01-23

## 2023-12-28 ENCOUNTER — Ambulatory Visit: Admitting: Podiatry

## 2023-12-31 ENCOUNTER — Encounter: Payer: Self-pay | Admitting: Podiatry

## 2023-12-31 ENCOUNTER — Ambulatory Visit: Admitting: Podiatry

## 2023-12-31 DIAGNOSIS — M722 Plantar fascial fibromatosis: Secondary | ICD-10-CM

## 2023-12-31 MED ORDER — TRIAMCINOLONE ACETONIDE 10 MG/ML IJ SUSP
2.5000 mg | Freq: Once | INTRAMUSCULAR | Status: AC
Start: 2023-12-31 — End: 2023-12-31
  Administered 2023-12-31: 2.5 mg via INTRA_ARTICULAR

## 2023-12-31 MED ORDER — DEXAMETHASONE SODIUM PHOSPHATE 120 MG/30ML IJ SOLN
4.0000 mg | Freq: Once | INTRAMUSCULAR | Status: AC
Start: 2023-12-31 — End: 2023-12-31
  Administered 2023-12-31: 4 mg via INTRA_ARTICULAR

## 2023-12-31 NOTE — Progress Notes (Signed)
 Orthotics   Patient was present and evaluated for Custom molded foot orthotics. Patient will benefit from CFO's to provide total contact to BIL MLA's helping to balance and distribute body weight more evenly across BIL feet helping to reduce plantar pressure and pain. Orthotic will also encourage FF / RF alignment  Patient was scanned today and will return for fitting upon receipt

## 2023-12-31 NOTE — Progress Notes (Signed)
  Subjective:  Patient ID: Suzanne Villanueva, female    DOB: Apr 12, 1981,   MRN: [id number]  Chief Complaint  Patient presents with   Plantar Fasciitis    My right foot is horrible.  It's making my left foot hurt because I have been trying to compensate for it.  This brace isn't doing anything.    42 y.o. female presents for concern of reoccurance of the plantar fasciitis on the right.  She was last seen in December and had had a shot. Doing well but now right foot has been really painful and now acting up on the left because she has been compensating. The brace is no longer helpful. . Denies any other pedal complaints. Denies n/v/f/c.   Past Medical History:  Diagnosis Date   Abnormal Pap smear    Abscess    behind the right ear   Chicken pox    Complication of anesthesia    Getsvery emotional   Depression    GERD (gastroesophageal reflux disease)    Heart murmur    History of CHF (congestive heart failure)    post partum 09/2011 - echo 09/21/11: EF 60-65%, mod MR, mild LAE (due to post partum fluid shifts, HTN, obesity, ?MR)   Hypertension    Hypothyroidism    Migraines    Moderate mitral regurgitation    needs echo 03/2012   MRSA (methicillin resistant staph aureus) culture positive    PTSD (post-traumatic stress disorder)    Scarlet fever x 2   Seasonal allergies    Urinary tract infection     Objective:  Physical Exam: Vascular: DP/PT pulses 2/4 bilateral. CFT <3 seconds. Normal hair growth on digits. No edema.  Skin. No lacerations or abrasions bilateral feet.  Musculoskeletal: MMT 5/5 bilateral lower extremities in DF, PF, Inversion and Eversion. Deceased ROM in DF of ankle joint.  Tender to medial calcaneal tubercle and into the central area of the heel. Some tenderness to the achilles. No pain along arch or PT tendon. No pain with calcaneal squeeze. Mild tenderness tenderness on left medial calcaneal tubercle.  Neurological: Sensation intact to light touch.    Assessment:   1. Plantar fasciitis, right        Plan:  Patient was evaluated and treated and all questions answered. Discussed plantar fasciitis with patient.  X-rays reviewed and discussed with patient. No acute fractures or dislocations noted. Mild spurring noted at inferior calcaneus.  Reviewed noted from PCP Discussed treatment options including, ice, NSAIDS, supportive shoes, bracing, and stretching.  Continue stretching.  Discussed trying CMO and fitted today.  Patient requesting injection today. Procedure note below.   Follow-up in 6 weeks for recheck.   Procedure:  Discussed etiology, pathology, conservative vs. surgical therapies. At this time a plantar fascial injection was recommended.  The patient agreed and a sterile skin prep was applied.  An injection consisting of  dexamethasone  and marcaine  mixture was infiltrated at the point of maximal tenderness on the right Heel.  Bandaid applied. The patient tolerated this well and was given instructions for aftercare.     Asberry Failing, DPM

## 2024-01-03 ENCOUNTER — Encounter: Payer: Self-pay | Admitting: Podiatry

## 2024-01-04 ENCOUNTER — Other Ambulatory Visit: Payer: Self-pay | Admitting: Podiatry

## 2024-01-04 MED ORDER — MELOXICAM 15 MG PO TABS: 15.0000 mg | ORAL_TABLET | Freq: Every day | ORAL | 0 refills | Status: DC

## 2024-01-10 ENCOUNTER — Telehealth: Payer: Self-pay

## 2024-01-10 NOTE — Telephone Encounter (Signed)
 Called to schedule orthotic fitting.. pt will take a look at her schedule and call us  back to schedule

## 2024-01-11 ENCOUNTER — Ambulatory Visit: Admitting: Podiatry

## 2024-01-22 ENCOUNTER — Encounter: Payer: Self-pay | Admitting: Family Medicine

## 2024-01-22 DIAGNOSIS — F900 Attention-deficit hyperactivity disorder, predominantly inattentive type: Secondary | ICD-10-CM

## 2024-01-23 MED ORDER — METHYLPHENIDATE HCL 20 MG PO TABS
20.0000 mg | ORAL_TABLET | Freq: Three times a day (TID) | ORAL | 0 refills | Status: DC
Start: 2024-01-23 — End: 2024-02-26

## 2024-01-24 ENCOUNTER — Other Ambulatory Visit: Payer: Self-pay

## 2024-01-24 DIAGNOSIS — K219 Gastro-esophageal reflux disease without esophagitis: Secondary | ICD-10-CM

## 2024-01-24 MED ORDER — PANTOPRAZOLE SODIUM 40 MG PO TBEC: 40.0000 mg | DELAYED_RELEASE_TABLET | Freq: Two times a day (BID) | ORAL | 0 refills | Status: DC

## 2024-01-25 ENCOUNTER — Telehealth: Payer: Self-pay

## 2024-01-25 ENCOUNTER — Other Ambulatory Visit: Payer: Self-pay

## 2024-01-25 MED ORDER — VALSARTAN-HYDROCHLOROTHIAZIDE 160-25 MG PO TABS: 1.0000 | ORAL_TABLET | Freq: Every day | ORAL | 1 refills | Status: AC

## 2024-01-25 NOTE — Telephone Encounter (Signed)
 Received a refill request for valsartan -hydrochlorothiazide . I sent in a refill and lvm for patient to call and schedule her physical that she is overdue for

## 2024-01-31 ENCOUNTER — Other Ambulatory Visit: Payer: Self-pay | Admitting: Podiatry

## 2024-02-12 ENCOUNTER — Encounter: Payer: Self-pay | Admitting: Podiatry

## 2024-02-12 ENCOUNTER — Ambulatory Visit: Admitting: Podiatry

## 2024-02-12 DIAGNOSIS — M722 Plantar fascial fibromatosis: Secondary | ICD-10-CM

## 2024-02-12 MED ORDER — TRIAMCINOLONE ACETONIDE 10 MG/ML IJ SUSP
2.5000 mg | Freq: Once | INTRAMUSCULAR | Status: AC
Start: 2024-02-12 — End: 2024-02-12
  Administered 2024-02-12: 2.5 mg via INTRA_ARTICULAR

## 2024-02-12 MED ORDER — DEXAMETHASONE SODIUM PHOSPHATE 120 MG/30ML IJ SOLN
4.0000 mg | Freq: Once | INTRAMUSCULAR | Status: AC
Start: 2024-02-12 — End: 2024-02-12
  Administered 2024-02-12: 4 mg via INTRA_ARTICULAR

## 2024-02-12 NOTE — Progress Notes (Signed)
  Subjective:  Patient ID: Suzanne Villanueva, female    DOB: Jun 23, 1980,   MRN: [id number]  Chief Complaint  Patient presents with   Plantar Fasciitis    The right one is okay, a little better.  The left one is starting to hurt now.    43 y.o. female presents for concern of now left heel pain that has been ongoing for a while now. The right is still tender but not as bad. She has been awaiting her orthotics. Injeciton on right did help. The left heel pain has been worse with first steps and has tried some stretching. Denies any other pedal complaints. Denies n/v/f/c.   Past Medical History:  Diagnosis Date   Abnormal Pap smear    Abscess    behind the right ear   Chicken pox    Complication of anesthesia    Getsvery emotional   Depression    GERD (gastroesophageal reflux disease)    Heart murmur    History of CHF (congestive heart failure)    post partum 09/2011 - echo 09/21/11: EF 60-65%, mod MR, mild LAE (due to post partum fluid shifts, HTN, obesity, ?MR)   Hypertension    Hypothyroidism    Migraines    Moderate mitral regurgitation    needs echo 03/2012   MRSA (methicillin resistant staph aureus) culture positive    PTSD (post-traumatic stress disorder)    Scarlet fever x 2   Seasonal allergies    Urinary tract infection     Objective:  Physical Exam: Vascular: DP/PT pulses 2/4 bilateral. CFT <3 seconds. Normal hair growth on digits. No edema.  Skin. No lacerations or abrasions bilateral feet.  Musculoskeletal: MMT 5/5 bilateral lower extremities in DF, PF, Inversion and Eversion. Deceased ROM in DF of ankle joint.  Tender to medial calcaneal tubercle and into the central area of the heel. Some tenderness to the achilles. No pain along arch or PT tendon. No pain with calcaneal squeeze.  Severetenderness tenderness on left medial calcaneal tubercle.  Neurological: Sensation intact to light touch.   Assessment:   1. Plantar fasciitis, left   2. Plantar fasciitis, right          Plan:  Patient was evaluated and treated and all questions answered. Discussed plantar fasciitis with patient.  X-rays reviewed and discussed with patient. No acute fractures or dislocations noted. Mild spurring noted at inferior calcaneus.  Reviewed noted from PCP Discussed treatment options including, ice, NSAIDS, supportive shoes, bracing, and stretching.  Continue stretching.  CMO dispensed today and instrcuted on break in process. Fitting to patient comfort.  Patient requesting injection today. Procedure note below.   Follow-up in 6 weeks for recheck.   Procedure:  Discussed etiology, pathology, conservative vs. surgical therapies. At this time a plantar fascial injection was recommended.  The patient agreed and a sterile skin prep was applied.  An injection consisting of  dexamethasone  and marcaine  mixture was infiltrated at the point of maximal tenderness on the rleftHeel.  Bandaid applied. The patient tolerated this well and was given instructions for aftercare.     Asberry Failing, DPM

## 2024-02-26 ENCOUNTER — Encounter: Payer: Self-pay | Admitting: Family Medicine

## 2024-02-26 DIAGNOSIS — F900 Attention-deficit hyperactivity disorder, predominantly inattentive type: Secondary | ICD-10-CM

## 2024-02-26 NOTE — Telephone Encounter (Signed)
 Requested Prescriptions   Pending Prescriptions Disp Refills   methylphenidate  (RITALIN ) 20 MG tablet 90 tablet 0    Sig: Take 1 tablet (20 mg total) by mouth 3 (three) times daily with meals.     Date of patient request: 02/26/24 Last office visit: 06/06/2023 Upcoming visit: 06/13/2024 Date of last refill: 01/23/24 Last refill amount: 30 days

## 2024-02-27 MED ORDER — METHYLPHENIDATE HCL 20 MG PO TABS: 20.0000 mg | ORAL_TABLET | Freq: Three times a day (TID) | ORAL | 0 refills | Status: DC

## 2024-03-03 ENCOUNTER — Ambulatory Visit (INDEPENDENT_AMBULATORY_CARE_PROVIDER_SITE_OTHER): Admitting: Family Medicine

## 2024-03-03 ENCOUNTER — Encounter: Payer: Self-pay | Admitting: Family Medicine

## 2024-03-03 VITALS — BP 128/70 | HR 82 | Temp 98.0°F | Ht 67.0 in | Wt 358.2 lb

## 2024-03-03 DIAGNOSIS — E559 Vitamin D deficiency, unspecified: Secondary | ICD-10-CM

## 2024-03-03 DIAGNOSIS — Z0000 Encounter for general adult medical examination without abnormal findings: Secondary | ICD-10-CM

## 2024-03-03 DIAGNOSIS — M546 Pain in thoracic spine: Secondary | ICD-10-CM | POA: Insufficient documentation

## 2024-03-03 DIAGNOSIS — Z23 Encounter for immunization: Secondary | ICD-10-CM

## 2024-03-03 DIAGNOSIS — F339 Major depressive disorder, recurrent, unspecified: Secondary | ICD-10-CM | POA: Insufficient documentation

## 2024-03-03 LAB — CBC WITH DIFFERENTIAL/PLATELET
Basophils Absolute: 0 K/uL (ref 0.0–0.1)
Basophils Relative: 0.6 % (ref 0.0–3.0)
Eosinophils Absolute: 0.1 K/uL (ref 0.0–0.7)
Eosinophils Relative: 2.1 % (ref 0.0–5.0)
HCT: 37.4 % (ref 36.0–46.0)
Hemoglobin: 12.3 g/dL (ref 12.0–15.0)
Lymphocytes Relative: 25.9 % (ref 12.0–46.0)
Lymphs Abs: 1.6 K/uL (ref 0.7–4.0)
MCHC: 32.8 g/dL (ref 30.0–36.0)
MCV: 88 fl (ref 78.0–100.0)
Monocytes Absolute: 0.6 K/uL (ref 0.1–1.0)
Monocytes Relative: 10.7 % (ref 3.0–12.0)
Neutro Abs: 3.7 K/uL (ref 1.4–7.7)
Neutrophils Relative %: 60.7 % (ref 43.0–77.0)
Platelets: 219 K/uL (ref 150.0–400.0)
RBC: 4.26 Mil/uL (ref 3.87–5.11)
RDW: 14.7 % (ref 11.5–15.5)
WBC: 6.1 K/uL (ref 4.0–10.5)

## 2024-03-03 LAB — HEPATIC FUNCTION PANEL
ALT: 17 U/L (ref 0–35)
AST: 17 U/L (ref 0–37)
Albumin: 4.2 g/dL (ref 3.5–5.2)
Alkaline Phosphatase: 50 U/L (ref 39–117)
Bilirubin, Direct: 0.2 mg/dL (ref 0.0–0.3)
Total Bilirubin: 0.9 mg/dL (ref 0.2–1.2)
Total Protein: 7.5 g/dL (ref 6.0–8.3)

## 2024-03-03 LAB — LIPID PANEL
Cholesterol: 142 mg/dL (ref 0–200)
HDL: 45.3 mg/dL (ref >39.00)
LDL Cholesterol: 83 mg/dL (ref 0–99)
NonHDL: 96.36
Total CHOL/HDL Ratio: 3
Triglycerides: 67 mg/dL (ref 0.0–149.0)
VLDL: 13.4 mg/dL (ref 0.0–40.0)

## 2024-03-03 LAB — HEMOGLOBIN A1C: Hgb A1c MFr Bld: 5.5 % (ref 4.6–6.5)

## 2024-03-03 LAB — BASIC METABOLIC PANEL WITH GFR
BUN: 16 mg/dL (ref 6–23)
CO2: 28 meq/L (ref 19–32)
Calcium: 9.2 mg/dL (ref 8.4–10.5)
Chloride: 105 meq/L (ref 96–112)
Creatinine, Ser: 0.76 mg/dL (ref 0.40–1.20)
GFR: 97.58 mL/min (ref >60.00)
Glucose, Bld: 97 mg/dL (ref 70–99)
Potassium: 4.6 meq/L (ref 3.5–5.1)
Sodium: 140 meq/L (ref 135–145)

## 2024-03-03 LAB — VITAMIN D 25 HYDROXY (VIT D DEFICIENCY, FRACTURES): VITD: 14.24 ng/mL — ABNORMAL LOW (ref 30.00–100.00)

## 2024-03-03 LAB — TSH: TSH: 3.78 u[IU]/mL (ref 0.35–5.50)

## 2024-03-03 MED ORDER — ESCITALOPRAM OXALATE 10 MG PO TABS
10.0000 mg | ORAL_TABLET | Freq: Every day | ORAL | 3 refills | Status: DC
Start: 2024-03-03 — End: 2024-03-27

## 2024-03-03 NOTE — Patient Instructions (Signed)
 Follow up in 4-6 weeks to recheck mood We'll notify you of your lab results and make any changes if needed START the Lexapro  once daily Continue to work on healthy diet and regular exercise- you can do it! Call with any questions or concerns Stay Safe!  Stay Healthy! Hang in there!!

## 2024-03-03 NOTE — Progress Notes (Unsigned)
   Subjective:    Patient ID: Suzanne Villanueva, female    DOB: 1981-05-25, 43 y.o.   MRN: [id number]  HPI CPE- due for flu shot.  UTD on mammo, pap, Tdap.  Too young for colonoscopy  Patient Care Team    Relationship Specialty Notifications Start End  Mahlon Comer BRAVO, MD PCP - General Family Medicine  10/03/13   Okey Leader, MD Consulting Physician Obstetrics and Gynecology  04/21/15     Health Maintenance  Topic Date Due   Pneumococcal Vaccine (1 of 2 - PCV) Never done   Hepatitis B Vaccines 19-59 Average Risk (1 of 3 - 19+ 3-dose series) Never done   HPV VACCINES (1 - 3-dose SCDM series) Never done   Influenza Vaccine  01/18/2024   COVID-19 Vaccine (5 - 2025-26 season) 02/18/2024   Mammogram  02/06/2025   Cervical Cancer Screening (HPV/Pap Cotest)  04/14/2025   DTaP/Tdap/Td (3 - Td or Tdap) 11/16/2025   Hepatitis C Screening  Completed   HIV Screening  Completed   Meningococcal B Vaccine  Aged Out      Review of Systems Patient reports no vision/ hearing changes, adenopathy,fever,  persistant/recurrent hoarseness , swallowing issues, chest pain, palpitations, edema, persistant/recurrent cough, hemoptysis, dyspnea (rest/exertional/paroxysmal nocturnal), gastrointestinal bleeding (melena, rectal bleeding), abdominal pain, significant heartburn, bowel changes, GU symptoms (dysuria, hematuria, incontinence), Gyn symptoms (abnormal  bleeding, pain),  syncope, focal weakness, memory loss, numbness & tingling, skin/hair/nail changes, abnormal bruising or bleeding   + 10 lb weight gain + depression- struggling w/ mother-in-law    Objective:   Physical Exam General Appearance:    Alert, cooperative, no distress, appears stated age, obese  Head:    Normocephalic, without obvious abnormality, atraumatic  Eyes:    PERRL, conjunctiva/corneas clear, EOM's intact both eyes  Ears:    Normal TM's and external ear canals, both ears  Nose:   Nares normal, septum midline, mucosa normal, no  drainage    or sinus tenderness  Throat:   Lips, mucosa, and tongue normal; teeth and gums normal  Neck:   Supple, symmetrical, trachea midline, no adenopathy;    Thyroid : no enlargement/tenderness/nodules  Back:     Symmetric, no curvature, ROM normal, no CVA tenderness  Lungs:     Clear to auscultation bilaterally, respirations unlabored  Chest Wall:    No tenderness or deformity   Heart:    Regular rate and rhythm, S1 and S2 normal, no murmur, rub   or gallop  Breast Exam:    Deferred to GYN  Abdomen:     Soft, non-tender, bowel sounds active all four quadrants,    no masses, no organomegaly  Genitalia:    Deferred to GYN  Rectal:    Extremities:   Extremities normal, atraumatic, no cyanosis or edema  Pulses:   2+ and symmetric all extremities  Skin:   Skin color, texture, turgor normal, no rashes or lesions  Lymph nodes:   Cervical, supraclavicular, and axillary nodes normal  Neurologic:   CNII-XII intact, normal strength, sensation and reflexes    throughout          Assessment & Plan:

## 2024-03-04 ENCOUNTER — Other Ambulatory Visit: Payer: Self-pay

## 2024-03-04 ENCOUNTER — Ambulatory Visit: Payer: Self-pay | Admitting: Family Medicine

## 2024-03-04 MED ORDER — VITAMIN D (ERGOCALCIFEROL) 1.25 MG (50000 UNIT) PO CAPS: 50000.0000 [IU] | ORAL_CAPSULE | ORAL | 0 refills | Status: AC

## 2024-03-04 NOTE — Progress Notes (Signed)
 LVM for pt to call office or via MyChart

## 2024-03-04 NOTE — Assessment & Plan Note (Signed)
 New.  Restart Lexapro  daily as pt has had good success with this in the past

## 2024-03-04 NOTE — Assessment & Plan Note (Signed)
 Pt's PE WNL w/ exception of BMI.  UTD on mammo, pap, Tdap.  Flu shot given.  Check labs.  Anticipatory guidance provided.

## 2024-03-04 NOTE — Progress Notes (Signed)
 Vitamin D  50,000 has been sent in.

## 2024-03-05 NOTE — Progress Notes (Signed)
 Pt has reviewed labs via MyChart

## 2024-03-11 ENCOUNTER — Other Ambulatory Visit: Payer: Self-pay | Admitting: Podiatry

## 2024-03-13 ENCOUNTER — Encounter: Payer: Self-pay | Admitting: Family Medicine

## 2024-03-14 MED ORDER — NITROFURANTOIN MONOHYD MACRO 100 MG PO CAPS: ORAL_CAPSULE | ORAL | 3 refills | Status: AC

## 2024-03-14 NOTE — Telephone Encounter (Signed)
Okay to refill Macrobid

## 2024-03-17 ENCOUNTER — Ambulatory Visit: Admitting: Podiatry

## 2024-03-17 ENCOUNTER — Encounter: Payer: Self-pay | Admitting: Podiatry

## 2024-03-17 DIAGNOSIS — M722 Plantar fascial fibromatosis: Secondary | ICD-10-CM | POA: Diagnosis not present

## 2024-03-17 MED ORDER — DEXAMETHASONE SODIUM PHOSPHATE 120 MG/30ML IJ SOLN
4.0000 mg | Freq: Once | INTRAMUSCULAR | Status: AC
  Administered 2024-03-17: 4 mg via INTRA_ARTICULAR

## 2024-03-17 MED ORDER — TRIAMCINOLONE ACETONIDE 10 MG/ML IJ SUSP
2.5000 mg | Freq: Once | INTRAMUSCULAR | Status: AC
  Administered 2024-03-17: 2.5 mg via INTRA_ARTICULAR

## 2024-03-17 NOTE — Progress Notes (Signed)
  Subjective:  Patient ID: Suzanne Villanueva, female    DOB: Aug 26, 1980,   MRN: [id number]  Chief Complaint  Patient presents with   Plantar Fasciitis    The inserts haven't done nearly what I was hoping it would do.  The left foot is now hurting down the side.    43 y.o. female presents for continues blateral plantar fasciitis. Relates left is still worse. Right still present but just feels tight. Relates the left injection helped for a bit but pain now more on the outside of the foot.  The left heel pain has been worse with first steps and has tried some stretching. Denies any other pedal complaints. Denies n/v/f/c.   Past Medical History:  Diagnosis Date   Abnormal Pap smear    Abscess    behind the right ear   Chicken pox    Complication of anesthesia    Getsvery emotional   Depression    GERD (gastroesophageal reflux disease)    Heart murmur    History of CHF (congestive heart failure)    post partum 09/2011 - echo 09/21/11: EF 60-65%, mod MR, mild LAE (due to post partum fluid shifts, HTN, obesity, ?MR)   Hypertension    Hypothyroidism    Migraines    Moderate mitral regurgitation    needs echo 03/2012   MRSA (methicillin resistant staph aureus) culture positive    PTSD (post-traumatic stress disorder)    Scarlet fever x 2   Seasonal allergies    Urinary tract infection     Objective:  Physical Exam: Vascular: DP/PT pulses 2/4 bilateral. CFT <3 seconds. Normal hair growth on digits. No edema.  Skin. No lacerations or abrasions bilateral feet.  Musculoskeletal: MMT 5/5 bilateral lower extremities in DF, PF, Inversion and Eversion. Deceased ROM in DF of ankle joint.  Tender to medial calcaneal tubercle and into the central area of the heel. Some tenderness to the achilles. No pain along arch or PT tendon. No pain with calcaneal squeeze.  Severetenderness tenderness on left medial calcaneal tubercle.  Neurological: Sensation intact to light touch.   Assessment:   1.  Plantar fasciitis, left   2. Plantar fasciitis, right          Plan:  Patient was evaluated and treated and all questions answered. Discussed plantar fasciitis with patient.  X-rays reviewed and discussed with patient. No acute fractures or dislocations noted. Mild spurring noted at inferior calcaneus.  Reviewed noted from PCP Discussed treatment options including, ice, NSAIDS, supportive shoes, bracing, and stretching.  Continue stretching. Amb ref to PT  Continue CMO  Patient requesting injection today. Procedure note below.   Follow-up in 6 weeks for recheck.   Procedure:  Discussed etiology, pathology, conservative vs. surgical therapies. At this time a plantar fascial injection was recommended.  The patient agreed and a sterile skin prep was applied.  An injection consisting of  dexamethasone  and marcaine  mixture was infiltrated at the point of maximal tenderness on the rleftHeel.  Bandaid applied. The patient tolerated this well and was given instructions for aftercare.     Asberry Failing, DPM

## 2024-03-21 ENCOUNTER — Other Ambulatory Visit: Payer: Self-pay | Admitting: Family Medicine

## 2024-03-21 DIAGNOSIS — K219 Gastro-esophageal reflux disease without esophagitis: Secondary | ICD-10-CM

## 2024-03-25 ENCOUNTER — Ambulatory Visit: Admitting: Podiatry

## 2024-03-27 ENCOUNTER — Other Ambulatory Visit: Payer: Self-pay | Admitting: Family Medicine

## 2024-03-28 ENCOUNTER — Encounter: Payer: Self-pay | Admitting: Family Medicine

## 2024-03-28 DIAGNOSIS — F900 Attention-deficit hyperactivity disorder, predominantly inattentive type: Secondary | ICD-10-CM

## 2024-03-31 MED ORDER — METHYLPHENIDATE HCL 20 MG PO TABS: 20.0000 mg | ORAL_TABLET | Freq: Three times a day (TID) | ORAL | 0 refills | Status: DC

## 2024-03-31 NOTE — Telephone Encounter (Signed)
 Requested Prescriptions   Pending Prescriptions Disp Refills   methylphenidate  (RITALIN ) 20 MG tablet 90 tablet 0    Sig: Take 1 tablet (20 mg total) by mouth 3 (three) times daily with meals.     Date of patient request: 03/28/2024 Last office visit: 03/03/2024 Upcoming visit: 04/04/2024 Date of last refill: 02/27/2024 Last refill amount: 90

## 2024-04-04 ENCOUNTER — Ambulatory Visit: Admitting: Family Medicine

## 2024-04-22 ENCOUNTER — Other Ambulatory Visit: Payer: Self-pay | Admitting: Podiatry

## 2024-05-02 ENCOUNTER — Encounter: Payer: Self-pay | Admitting: Family Medicine

## 2024-05-02 DIAGNOSIS — F900 Attention-deficit hyperactivity disorder, predominantly inattentive type: Secondary | ICD-10-CM

## 2024-05-02 MED ORDER — METHYLPHENIDATE HCL 20 MG PO TABS: 20.0000 mg | ORAL_TABLET | Freq: Three times a day (TID) | ORAL | 0 refills | Status: DC

## 2024-05-02 NOTE — Telephone Encounter (Signed)
 Requesting: methylphenidate  20mg   Contract: UDS: Last Visit: 03/03/24 Next Visit: 05/07/24 Last Refill: 03/31/24 #90 and 0RF  Please Advise

## 2024-05-07 ENCOUNTER — Encounter: Payer: Self-pay | Admitting: Family Medicine

## 2024-05-07 ENCOUNTER — Ambulatory Visit: Admitting: Family Medicine

## 2024-05-07 VITALS — BP 130/78 | Temp 99.0°F | Ht 67.0 in | Wt 360.4 lb

## 2024-05-07 DIAGNOSIS — F339 Major depressive disorder, recurrent, unspecified: Secondary | ICD-10-CM

## 2024-05-07 NOTE — Patient Instructions (Addendum)
 Follow up in 6 months to recheck blood pressure No med changes at this time- keep up the good work! Call with any questions or concerns Stay Safe!  Stay Healthy! Happy Holidays!!

## 2024-05-07 NOTE — Progress Notes (Signed)
   Subjective:    Patient ID: Suzanne Villanueva, female    DOB: [birth year], 43 y.o.   MRN: [id number]  HPI Depression- at last visit we restarted Lexapro  10mg  daily.  Has since moved back home which is a big relief (was living w/ MIL).  Is setting boundaries, husband is supporting this.   Review of Systems For ROS see HPI     Objective:   Physical Exam Vitals reviewed.  Constitutional:      General: She is not in acute distress.    Appearance: Normal appearance. She is obese. She is not ill-appearing.  HENT:     Head: Normocephalic and atraumatic.  Eyes:     Extraocular Movements: Extraocular movements intact.     Conjunctiva/sclera: Conjunctivae normal.  Skin:    General: Skin is warm and dry.  Neurological:     General: No focal deficit present.     Mental Status: She is alert and oriented to person, place, and time.  Psychiatric:        Mood and Affect: Mood normal.        Behavior: Behavior normal.        Thought Content: Thought content normal.           Assessment & Plan:

## 2024-05-07 NOTE — Assessment & Plan Note (Signed)
 Much improved since moving back home and restarting Lexapro .  Pt feels much better and more in control of things.  No med changes at this time.  Will continue to follow.

## 2024-05-19 ENCOUNTER — Ambulatory Visit: Admitting: Podiatry

## 2024-05-30 ENCOUNTER — Encounter: Payer: Self-pay | Admitting: Family Medicine

## 2024-05-30 DIAGNOSIS — F900 Attention-deficit hyperactivity disorder, predominantly inattentive type: Secondary | ICD-10-CM

## 2024-05-30 MED ORDER — METHYLPHENIDATE HCL 20 MG PO TABS: 20.0000 mg | ORAL_TABLET | Freq: Three times a day (TID) | ORAL | 0 refills | Status: DC

## 2024-05-30 NOTE — Telephone Encounter (Signed)
 Requested Prescriptions   Pending Prescriptions Disp Refills   methylphenidate  (RITALIN ) 20 MG tablet 90 tablet 0    Sig: Take 1 tablet (20 mg total) by mouth 3 (three) times daily with meals.     Date of patient request: 05/30/2024 Last office visit: 05/07/2024 Upcoming visit: 11/04/2024 Date of last refill: 05/02/2024 Last refill amount: 90 tab 0 refill

## 2024-06-13 ENCOUNTER — Encounter: Admitting: Family Medicine

## 2024-06-26 ENCOUNTER — Other Ambulatory Visit: Payer: Self-pay | Admitting: Family Medicine

## 2024-06-26 DIAGNOSIS — K219 Gastro-esophageal reflux disease without esophagitis: Secondary | ICD-10-CM

## 2024-07-02 ENCOUNTER — Encounter: Payer: Self-pay | Admitting: Family Medicine

## 2024-07-02 DIAGNOSIS — F900 Attention-deficit hyperactivity disorder, predominantly inattentive type: Secondary | ICD-10-CM

## 2024-07-02 MED ORDER — METHYLPHENIDATE HCL 20 MG PO TABS: 20.0000 mg | ORAL_TABLET | Freq: Three times a day (TID) | ORAL | 0 refills | Status: AC

## 2024-07-02 NOTE — Telephone Encounter (Signed)
 Requested Prescriptions   Pending Prescriptions Disp Refills   methylphenidate  (RITALIN ) 20 MG tablet 90 tablet 0    Sig: Take 1 tablet (20 mg total) by mouth 3 (three) times daily with meals.     Date of patient request: 07/02/24 Last office visit: 05/07/2024 Upcoming visit: 11/04/2024 Date of last refill: 05/30/24 Last refill amount: 90

## 2024-07-13 ENCOUNTER — Other Ambulatory Visit: Payer: Self-pay | Admitting: Podiatry

## 2024-11-04 ENCOUNTER — Ambulatory Visit: Admitting: Family Medicine
